# Patient Record
Sex: Female | Born: 1960
Health system: Southern US, Community
[De-identification: ages and names within clinical notes are randomized; demographics above are authoritative.]

## PROBLEM LIST (undated history)

## (undated) DIAGNOSIS — I10 Essential (primary) hypertension: Secondary | ICD-10-CM

## (undated) DIAGNOSIS — R590 Localized enlarged lymph nodes: Secondary | ICD-10-CM

## (undated) DIAGNOSIS — J309 Allergic rhinitis, unspecified: Secondary | ICD-10-CM

## (undated) DIAGNOSIS — K219 Gastro-esophageal reflux disease without esophagitis: Secondary | ICD-10-CM

## (undated) DIAGNOSIS — M25569 Pain in unspecified knee: Secondary | ICD-10-CM

## (undated) DIAGNOSIS — M1990 Unspecified osteoarthritis, unspecified site: Secondary | ICD-10-CM

## (undated) DIAGNOSIS — R748 Abnormal levels of other serum enzymes: Secondary | ICD-10-CM

## (undated) DIAGNOSIS — K439 Ventral hernia without obstruction or gangrene: Secondary | ICD-10-CM

## (undated) DIAGNOSIS — R591 Generalized enlarged lymph nodes: Secondary | ICD-10-CM

## (undated) DIAGNOSIS — R1900 Intra-abdominal and pelvic swelling, mass and lump, unspecified site: Secondary | ICD-10-CM

## (undated) DIAGNOSIS — K7460 Unspecified cirrhosis of liver: Secondary | ICD-10-CM

## (undated) HISTORY — PX: BREAST BIOPSY: SHX20

## (undated) HISTORY — PX: GALLBLADDER SURGERY: SHX652

## (undated) HISTORY — PX: CORONARY ANGIOPLASTY: SHX604

## (undated) HISTORY — DX: Essential (primary) hypertension: I10

## (undated) HISTORY — DX: Localized enlarged lymph nodes: R59.0

## (undated) HISTORY — DX: Allergic rhinitis, unspecified: J30.9

## (undated) HISTORY — PX: ABDOMINAL HYSTERECTOMY: SHX81

## (undated) HISTORY — DX: Ventral hernia without obstruction or gangrene: K43.9

## (undated) HISTORY — DX: Abnormal levels of other serum enzymes: R74.8

## (undated) HISTORY — DX: Pain in unspecified knee: M25.569

## (undated) HISTORY — DX: Generalized enlarged lymph nodes: R59.1

## (undated) HISTORY — PX: JOINT REPLACEMENT: SHX530

## (undated) HISTORY — PX: TUBAL LIGATION: SHX77

## (undated) HISTORY — DX: Intra-abdominal and pelvic swelling, mass and lump, unspecified site: R19.00

---

## 1995-05-09 HISTORY — PX: CHOLECYSTECTOMY: SHX55

## 2000-07-03 ENCOUNTER — Inpatient Hospital Stay (HOSPITAL_COMMUNITY): Admission: AD | Admit: 2000-07-03 | Discharge: 2000-07-03 | Payer: Self-pay | Admitting: *Deleted

## 2000-07-03 ENCOUNTER — Encounter: Payer: Self-pay | Admitting: Obstetrics & Gynecology

## 2000-07-09 ENCOUNTER — Inpatient Hospital Stay (HOSPITAL_COMMUNITY): Admission: AD | Admit: 2000-07-09 | Discharge: 2000-07-09 | Payer: Self-pay | Admitting: Obstetrics and Gynecology

## 2000-07-27 ENCOUNTER — Inpatient Hospital Stay (HOSPITAL_COMMUNITY): Admission: AD | Admit: 2000-07-27 | Discharge: 2000-07-28 | Payer: Self-pay | Admitting: Obstetrics and Gynecology

## 2000-07-31 ENCOUNTER — Other Ambulatory Visit: Admission: RE | Admit: 2000-07-31 | Discharge: 2000-07-31 | Payer: Self-pay | Admitting: Obstetrics and Gynecology

## 2000-08-06 ENCOUNTER — Inpatient Hospital Stay (HOSPITAL_COMMUNITY): Admission: AD | Admit: 2000-08-06 | Discharge: 2000-08-06 | Payer: Self-pay | Admitting: Obstetrics and Gynecology

## 2000-08-06 ENCOUNTER — Encounter: Payer: Self-pay | Admitting: Obstetrics and Gynecology

## 2000-10-18 ENCOUNTER — Inpatient Hospital Stay (HOSPITAL_COMMUNITY): Admission: AD | Admit: 2000-10-18 | Discharge: 2000-10-18 | Payer: Self-pay | Admitting: Obstetrics and Gynecology

## 2000-12-26 ENCOUNTER — Inpatient Hospital Stay (HOSPITAL_COMMUNITY): Admission: AD | Admit: 2000-12-26 | Discharge: 2000-12-26 | Payer: Self-pay | Admitting: Obstetrics and Gynecology

## 2001-01-01 ENCOUNTER — Inpatient Hospital Stay (HOSPITAL_COMMUNITY): Admission: AD | Admit: 2001-01-01 | Discharge: 2001-01-01 | Payer: Self-pay | Admitting: Obstetrics and Gynecology

## 2001-01-01 ENCOUNTER — Encounter: Payer: Self-pay | Admitting: Obstetrics and Gynecology

## 2001-01-26 ENCOUNTER — Inpatient Hospital Stay (HOSPITAL_COMMUNITY): Admission: AD | Admit: 2001-01-26 | Discharge: 2001-01-28 | Payer: Self-pay | Admitting: Obstetrics and Gynecology

## 2001-03-06 ENCOUNTER — Other Ambulatory Visit: Admission: RE | Admit: 2001-03-06 | Discharge: 2001-03-06 | Payer: Self-pay | Admitting: Obstetrics and Gynecology

## 2002-03-17 ENCOUNTER — Ambulatory Visit (HOSPITAL_COMMUNITY): Admission: RE | Admit: 2002-03-17 | Discharge: 2002-03-17 | Payer: Self-pay | Admitting: *Deleted

## 2002-05-09 ENCOUNTER — Ambulatory Visit (HOSPITAL_COMMUNITY): Admission: RE | Admit: 2002-05-09 | Discharge: 2002-05-09 | Payer: Self-pay | Admitting: *Deleted

## 2004-06-19 ENCOUNTER — Emergency Department: Payer: Self-pay | Admitting: General Practice

## 2005-04-15 ENCOUNTER — Emergency Department: Payer: Self-pay | Admitting: Emergency Medicine

## 2005-04-16 ENCOUNTER — Emergency Department: Payer: Self-pay | Admitting: Emergency Medicine

## 2006-01-30 ENCOUNTER — Ambulatory Visit: Payer: Self-pay | Admitting: Family Medicine

## 2006-02-12 ENCOUNTER — Ambulatory Visit: Payer: Self-pay | Admitting: Family Medicine

## 2006-04-27 ENCOUNTER — Inpatient Hospital Stay: Payer: Self-pay | Admitting: Obstetrics and Gynecology

## 2006-08-03 ENCOUNTER — Emergency Department: Payer: Self-pay | Admitting: Unknown Physician Specialty

## 2006-08-05 ENCOUNTER — Emergency Department: Payer: Self-pay | Admitting: Emergency Medicine

## 2006-08-06 ENCOUNTER — Other Ambulatory Visit: Payer: Self-pay

## 2006-08-06 ENCOUNTER — Emergency Department: Payer: Self-pay | Admitting: Internal Medicine

## 2006-08-28 ENCOUNTER — Ambulatory Visit: Payer: Self-pay | Admitting: Obstetrics and Gynecology

## 2007-03-14 ENCOUNTER — Observation Stay: Payer: Self-pay | Admitting: Internal Medicine

## 2007-10-07 ENCOUNTER — Observation Stay: Payer: Self-pay | Admitting: Internal Medicine

## 2007-10-07 ENCOUNTER — Other Ambulatory Visit: Payer: Self-pay

## 2007-10-21 ENCOUNTER — Ambulatory Visit: Payer: Self-pay | Admitting: Gastroenterology

## 2008-02-11 ENCOUNTER — Ambulatory Visit: Payer: Self-pay | Admitting: Family Medicine

## 2008-04-26 ENCOUNTER — Emergency Department: Payer: Self-pay | Admitting: Emergency Medicine

## 2009-03-08 ENCOUNTER — Ambulatory Visit: Payer: Self-pay | Admitting: Family Medicine

## 2009-03-21 ENCOUNTER — Emergency Department: Payer: Self-pay | Admitting: Emergency Medicine

## 2010-02-05 ENCOUNTER — Observation Stay: Payer: Self-pay | Admitting: Internal Medicine

## 2010-04-14 ENCOUNTER — Ambulatory Visit: Payer: Self-pay | Admitting: Family Medicine

## 2010-07-29 ENCOUNTER — Other Ambulatory Visit: Payer: Self-pay | Admitting: General Surgery

## 2010-07-29 ENCOUNTER — Ambulatory Visit
Admission: RE | Admit: 2010-07-29 | Discharge: 2010-07-29 | Disposition: A | Discharge status: Home / Self Care | Payer: 59 | Source: Ambulatory Visit | Attending: General Surgery | Admitting: General Surgery

## 2010-09-13 ENCOUNTER — Encounter (INDEPENDENT_AMBULATORY_CARE_PROVIDER_SITE_OTHER): Payer: Self-pay | Admitting: General Surgery

## 2011-03-27 ENCOUNTER — Ambulatory Visit: Payer: Self-pay | Admitting: Family Medicine

## 2011-04-13 ENCOUNTER — Ambulatory Visit: Payer: Self-pay | Admitting: Otolaryngology

## 2011-04-21 ENCOUNTER — Emergency Department: Payer: Self-pay | Admitting: Emergency Medicine

## 2011-07-21 ENCOUNTER — Ambulatory Visit: Payer: Self-pay | Admitting: Family Medicine

## 2011-10-27 ENCOUNTER — Emergency Department: Payer: Self-pay | Admitting: *Deleted

## 2011-10-31 ENCOUNTER — Ambulatory Visit: Payer: Self-pay | Admitting: Family Medicine

## 2011-11-08 ENCOUNTER — Ambulatory Visit: Payer: Self-pay | Admitting: Orthopedic Surgery

## 2011-11-29 ENCOUNTER — Emergency Department: Payer: Self-pay | Admitting: Emergency Medicine

## 2011-11-29 ENCOUNTER — Ambulatory Visit: Payer: Self-pay | Admitting: Orthopedic Surgery

## 2011-11-29 LAB — BASIC METABOLIC PANEL
Anion Gap: 9 (ref 7–16)
BUN: 16 mg/dL (ref 7–18)
Calcium, Total: 9.2 mg/dL (ref 8.5–10.1)
Chloride: 96 mmol/L — ABNORMAL LOW (ref 98–107)
Co2: 28 mmol/L (ref 21–32)
Creatinine: 0.8 mg/dL (ref 0.60–1.30)
EGFR (African American): >60
EGFR (Non-African Amer.): >60
Glucose: 226 mg/dL — ABNORMAL HIGH (ref 65–99)
Osmolality: 275 (ref 275–301)
Potassium: 3.5 mmol/L (ref 3.5–5.1)
Sodium: 133 mmol/L — ABNORMAL LOW (ref 136–145)

## 2011-11-29 LAB — URINALYSIS, COMPLETE
Bilirubin,UR: NEGATIVE
Blood: NEGATIVE
Glucose,UR: NEGATIVE mg/dL (ref 0–75)
Ketone: NEGATIVE
Leukocyte Esterase: NEGATIVE
Nitrite: NEGATIVE
Ph: 5 (ref 4.5–8.0)
Protein: 30
RBC,UR: 3 /HPF (ref 0–5)
Specific Gravity: 1.026 (ref 1.003–1.030)
Squamous Epithelial: 3
WBC UR: 2 /HPF (ref 0–5)

## 2011-11-29 LAB — CBC
HCT: 44.2 % (ref 35.0–47.0)
HGB: 13.6 g/dL (ref 12.0–16.0)
MCH: 25.2 pg — ABNORMAL LOW (ref 26.0–34.0)
MCHC: 30.7 g/dL — ABNORMAL LOW (ref 32.0–36.0)
MCV: 82 fL (ref 80–100)
Platelet: 189 10*3/uL (ref 150–440)
RBC: 5.4 10*6/uL — ABNORMAL HIGH (ref 3.80–5.20)
RDW: 14.5 % (ref 11.5–14.5)
WBC: 8.4 10*3/uL (ref 3.6–11.0)

## 2011-11-30 ENCOUNTER — Emergency Department: Payer: Self-pay | Admitting: Emergency Medicine

## 2011-11-30 LAB — CBC WITH DIFFERENTIAL/PLATELET
Basophil #: 0.1 10*3/uL (ref 0.0–0.1)
Basophil %: 0.9 %
Eosinophil #: 0.2 10*3/uL (ref 0.0–0.7)
Eosinophil %: 2.1 %
HCT: 42.1 % (ref 35.0–47.0)
HGB: 13.4 g/dL (ref 12.0–16.0)
Lymphocyte %: 39.9 %
Lymphs Abs: 3 10*3/uL (ref 1.0–3.6)
MCH: 26.2 pg (ref 26.0–34.0)
MCHC: 31.9 g/dL — ABNORMAL LOW (ref 32.0–36.0)
MCV: 82 fL (ref 80–100)
Monocyte #: 0.5 x10 3/mm (ref 0.2–0.9)
Monocyte %: 7.1 %
Neutrophil #: 3.7 10*3/uL (ref 1.4–6.5)
Neutrophil %: 50 %
Platelet: 181 10*3/uL (ref 150–440)
RBC: 5.11 10*6/uL (ref 3.80–5.20)
RDW: 14.4 % (ref 11.5–14.5)
WBC: 7.4 10*3/uL (ref 3.6–11.0)

## 2011-11-30 LAB — URINALYSIS, COMPLETE
Bacteria: NONE SEEN
Bilirubin,UR: NEGATIVE
Blood: NEGATIVE
Glucose,UR: >=500 mg/dL (ref 0–75)
Ketone: NEGATIVE
Leukocyte Esterase: NEGATIVE
Nitrite: NEGATIVE
Ph: 6 (ref 4.5–8.0)
Protein: NEGATIVE
RBC,UR: 1 /HPF (ref 0–5)
Specific Gravity: 1.028 (ref 1.003–1.030)
Squamous Epithelial: 1
WBC UR: 2 /HPF (ref 0–5)

## 2011-11-30 LAB — COMPREHENSIVE METABOLIC PANEL
Albumin: 4 g/dL (ref 3.4–5.0)
Alkaline Phosphatase: 259 U/L — ABNORMAL HIGH (ref 50–136)
Anion Gap: 12 (ref 7–16)
BUN: 19 mg/dL — ABNORMAL HIGH (ref 7–18)
Bilirubin,Total: 0.6 mg/dL (ref 0.2–1.0)
Calcium, Total: 9.1 mg/dL (ref 8.5–10.1)
Chloride: 94 mmol/L — ABNORMAL LOW (ref 98–107)
Co2: 27 mmol/L (ref 21–32)
Creatinine: 0.88 mg/dL (ref 0.60–1.30)
EGFR (African American): >60
EGFR (Non-African Amer.): >60
Glucose: 340 mg/dL — ABNORMAL HIGH (ref 65–99)
Osmolality: 282 (ref 275–301)
Potassium: 3.8 mmol/L (ref 3.5–5.1)
SGOT(AST): 66 U/L — ABNORMAL HIGH (ref 15–37)
SGPT (ALT): 83 U/L — ABNORMAL HIGH
Sodium: 133 mmol/L — ABNORMAL LOW (ref 136–145)
Total Protein: 9 g/dL — ABNORMAL HIGH (ref 6.4–8.2)

## 2011-12-07 HISTORY — PX: KNEE SURGERY: SHX244

## 2011-12-11 ENCOUNTER — Ambulatory Visit: Payer: Self-pay | Admitting: Orthopedic Surgery

## 2011-12-11 LAB — CBC
HCT: 36.5 % (ref 35.0–47.0)
HGB: 12 g/dL (ref 12.0–16.0)
MCH: 26.8 pg (ref 26.0–34.0)
MCHC: 32.9 g/dL (ref 32.0–36.0)
MCV: 81 fL (ref 80–100)
Platelet: 200 10*3/uL (ref 150–440)
RBC: 4.49 10*6/uL (ref 3.80–5.20)
RDW: 14.7 % — ABNORMAL HIGH (ref 11.5–14.5)
WBC: 7.9 10*3/uL (ref 3.6–11.0)

## 2011-12-11 LAB — MRSA PCR SCREENING

## 2011-12-11 LAB — BASIC METABOLIC PANEL
Anion Gap: 6 — ABNORMAL LOW (ref 7–16)
BUN: 10 mg/dL (ref 7–18)
Calcium, Total: 9 mg/dL (ref 8.5–10.1)
Chloride: 104 mmol/L (ref 98–107)
Co2: 29 mmol/L (ref 21–32)
Creatinine: 0.75 mg/dL (ref 0.60–1.30)
EGFR (African American): >60
EGFR (Non-African Amer.): >60
Glucose: 88 mg/dL (ref 65–99)
Osmolality: 276 (ref 275–301)
Potassium: 3.5 mmol/L (ref 3.5–5.1)
Sodium: 139 mmol/L (ref 136–145)

## 2011-12-11 LAB — SEDIMENTATION RATE: Erythrocyte Sed Rate: 55 mm/h — ABNORMAL HIGH (ref 0–30)

## 2011-12-21 ENCOUNTER — Inpatient Hospital Stay: Payer: Self-pay | Admitting: Orthopedic Surgery

## 2011-12-22 LAB — BASIC METABOLIC PANEL
Anion Gap: 7 (ref 7–16)
BUN: 9 mg/dL (ref 7–18)
Calcium, Total: 8.2 mg/dL — ABNORMAL LOW (ref 8.5–10.1)
Chloride: 101 mmol/L (ref 98–107)
Co2: 28 mmol/L (ref 21–32)
Creatinine: 0.7 mg/dL (ref 0.60–1.30)
EGFR (African American): >60
EGFR (Non-African Amer.): >60
Glucose: 184 mg/dL — ABNORMAL HIGH (ref 65–99)
Osmolality: 275 (ref 275–301)
Potassium: 3.7 mmol/L (ref 3.5–5.1)
Sodium: 136 mmol/L (ref 136–145)

## 2011-12-22 LAB — HEMOGLOBIN: HGB: 10.4 g/dL — ABNORMAL LOW (ref 12.0–16.0)

## 2011-12-22 LAB — PLATELET COUNT: Platelet: 161 10*3/uL (ref 150–440)

## 2011-12-23 LAB — HEMOGLOBIN: HGB: 9.4 g/dL — ABNORMAL LOW (ref 12.0–16.0)

## 2011-12-25 LAB — PATHOLOGY REPORT

## 2012-05-13 ENCOUNTER — Ambulatory Visit: Payer: Self-pay | Admitting: Otolaryngology

## 2012-11-06 DIAGNOSIS — N9489 Other specified conditions associated with female genital organs and menstrual cycle: Secondary | ICD-10-CM | POA: Insufficient documentation

## 2012-11-12 LAB — HM PAP SMEAR: HM Pap smear: NEGATIVE

## 2012-11-19 DIAGNOSIS — K439 Ventral hernia without obstruction or gangrene: Secondary | ICD-10-CM | POA: Insufficient documentation

## 2012-11-19 DIAGNOSIS — R1900 Intra-abdominal and pelvic swelling, mass and lump, unspecified site: Secondary | ICD-10-CM

## 2012-11-19 HISTORY — PX: HERNIA REPAIR: SHX51

## 2012-11-19 HISTORY — DX: Ventral hernia without obstruction or gangrene: K43.9

## 2012-11-19 HISTORY — DX: Intra-abdominal and pelvic swelling, mass and lump, unspecified site: R19.00

## 2013-07-28 DIAGNOSIS — M25551 Pain in right hip: Secondary | ICD-10-CM | POA: Insufficient documentation

## 2013-07-28 DIAGNOSIS — Z96659 Presence of unspecified artificial knee joint: Secondary | ICD-10-CM | POA: Insufficient documentation

## 2013-08-18 ENCOUNTER — Ambulatory Visit: Payer: Self-pay | Admitting: Otolaryngology

## 2013-08-26 ENCOUNTER — Ambulatory Visit: Payer: Self-pay | Admitting: Family Medicine

## 2013-08-26 LAB — HM MAMMOGRAPHY

## 2013-09-15 ENCOUNTER — Ambulatory Visit: Payer: Self-pay | Admitting: Otolaryngology

## 2013-10-01 ENCOUNTER — Ambulatory Visit: Payer: Self-pay | Admitting: Otolaryngology

## 2013-10-02 LAB — PATHOLOGY REPORT

## 2014-01-09 ENCOUNTER — Other Ambulatory Visit (HOSPITAL_COMMUNITY): Payer: Self-pay | Admitting: Orthopedic Surgery

## 2014-01-09 DIAGNOSIS — M25562 Pain in left knee: Secondary | ICD-10-CM

## 2014-01-19 ENCOUNTER — Encounter (HOSPITAL_COMMUNITY): Payer: 59

## 2014-01-19 ENCOUNTER — Encounter (HOSPITAL_COMMUNITY): Payer: Self-pay

## 2014-01-19 ENCOUNTER — Ambulatory Visit (HOSPITAL_COMMUNITY): Payer: Self-pay

## 2014-01-19 ENCOUNTER — Encounter (HOSPITAL_COMMUNITY)
Admission: RE | Admit: 2014-01-19 | Discharge: 2014-01-19 | Disposition: A | Discharge status: Home / Self Care | Payer: 59 | Source: Ambulatory Visit | Attending: Orthopedic Surgery | Admitting: Orthopedic Surgery

## 2014-01-19 DIAGNOSIS — M25562 Pain in left knee: Secondary | ICD-10-CM

## 2014-01-19 DIAGNOSIS — M25569 Pain in unspecified knee: Secondary | ICD-10-CM | POA: Insufficient documentation

## 2014-01-19 MED ORDER — TECHNETIUM TC 99M MEDRONATE IV KIT
25.0000 | PACK | Freq: Once | INTRAVENOUS | Status: AC | PRN
  Administered 2014-01-19: 25 via INTRAVENOUS

## 2014-03-17 DIAGNOSIS — E1169 Type 2 diabetes mellitus with other specified complication: Secondary | ICD-10-CM | POA: Insufficient documentation

## 2014-03-17 DIAGNOSIS — E785 Hyperlipidemia, unspecified: Secondary | ICD-10-CM

## 2014-03-23 ENCOUNTER — Ambulatory Visit: Payer: Self-pay

## 2014-08-25 NOTE — Op Note (Signed)
PATIENT NAME:  Lisa [name], Lisa [name] MR#:  [id number] DATE OF BIRTH:  1961/01/30  DATE OF PROCEDURE:  12/21/2011  PREOPERATIVE DIAGNOSIS: Left knee osteoarthritis.   POSTOPERATIVE DIAGNOSIS: Left knee osteoarthritis.   PROCEDURE: Left total knee replacement.   SURGEON: Laurene Footman.   ANESTHESIA: Spinal.   DESCRIPTION OF PROCEDURE: The patient was brought to the Operating Room and after adequate anesthesia was obtained the left leg was prepped and draped in the usual sterile fashion after tourniquet was applied to the upper thigh and Alvarado legholder being used. After appropriate patient identification and time out procedures were completed, the leg was exsanguinated with an Esmarch and the tourniquet raised to 300 mmHg. A midline skin incision was made followed by a medial parapatellar arthrotomy. Inspection revealed significant wear and synovitis particularly in the medial and patellofemoral. The anterior cruciate ligament was excised along with patellar fat pad and the tibia exposed for application of the [place] cutting guide to the proximal tibia. When alignment was checked and pins placed, the proximal tibia cut was carried out and bone removed. The femur was exposed in a similar manner and cutting block applied, checked and distal femoral cut made. The #4 cutting guide block was applied. Anterior, posterior, and chamfer cuts were carried out with no notching. The residual bone on the tibia was removed at this point along with the medial and lateral menisci. The #4 tibia was set with the punch and 10 mm trial and the #4 femur. There was excellent stability in flexion and extension. Distal femoral drill hole and femoral notch were created at this time. The patella was cut and sized to a size 2 with the cutting guide, drill holes placed and tracked well with no touch technique. At this point, all trials were removed and cocktail of morphine, Toradol, and 0.25% Sensorcaine were infiltrated. The bony  surfaces were thoroughly irrigated and dried. The components were cemented into place with excess cement removed. The tourniquet was let down and hemostasis checked with electrocautery. The arthrotomy was then repaired using a heavy quill suture, 2-0 quill subcutaneously and skin staples. Xeroform, 4 x 4's, ABD, Webril, Polar Care, and Ace wrap were applied. The patient was sent to the recovery room in stable condition.   ESTIMATED BLOOD LOSS: 100 mL.   COMPLICATIONS: None.   SPECIMEN: Cut ends of bone.   IMPLANTS: Medacta GMK primary total knee system size 4n left femur, size 4 tibia with a 10 mm standard tibial insert, and a 2 patellar component. All components were cemented.   TOURNIQUET TIME: 65 minutes at 300 mmHg. ____________________________ Laurene Footman, MD mjm:slb D: 12/21/2011 10:42:21 ET T: 12/21/2011 13:27:23 ET JOB#: [id number]  cc: Laurene Footman, MD, <Dictator> Laurene Footman MD ELECTRONICALLY SIGNED 12/21/2011 16:31

## 2014-08-25 NOTE — Discharge Summary (Signed)
PATIENT NAME:  Lisa [name], Lisa [name] MR#:  [id number] DATE OF BIRTH:  04-Mar-1961  DATE OF ADMISSION:  12/21/2011 DATE OF DISCHARGE:  12/25/2011  ADMITTING DIAGNOSIS: Left knee osteoarthritis.   DISCHARGE DIAGNOSIS: Left knee osteoarthritis.   PROCEDURE: Left total knee replacement.   SURGEON: Laurene Footman, MD    ANESTHESIA: Spinal.   ESTIMATED BLOOD LOSS: 100 mL.   COMPLICATIONS: None.   SPECIMEN: Cut ends of bone.   IMPLANTS: Medacta GMK primary total knee system, size 4N left femur, size 4 tibia with a 10 mm standard tibial insert, and a 2 patellar component. All components were cemented.  TOURNIQUET TIME: 65 minutes at 300 mmHg.   HISTORY: The patient is a 54 year old female who had a greater than one year history of severe left knee pain. The patient has been unable to have cortisone injections due to her diabetes but she had Synvisc injections without any relief. The patient continued to have persistent pain and swelling of the left knee, mostly on the medial joint line. X-rays as well as MRIs showed extensive arthritis in the left knee. The patient does not get much relief with oral medications as well. She works a lot on her feet and feels like she is unable to perform her job as well as other activities of daily living at home.   PHYSICAL EXAMINATION: GENERAL: Well developed, obese female in no apparent distress. Normal affect. Mild antalgic component on left lower extremity. HEENT: Head normocephalic, atraumatic. EYES: Pupils equal, round, and reactive to light. No dentures. LUNGS: Clear to auscultation. No wheezes, rales, or rhonchi. Good breath sounds bilaterally. HEART: Regular rate and rhythm. Normal S1, S2. LEFT KNEE: Examination of the left knee shows no effusion, no erythema, no warmth. No bony abnormalities noted. She does have passively correctable varus deformity. The patient is tender along the medial joint line, nontender along the lateral joint line. There is no noted  posterior joint effusion. The patient has full extension and full flexion. There is no tenderness of flexion, extension. The patient does have crepitus at the medial and patellofemoral joint of the left knee distally. She has a negative McMurray's test. There is mild retropatellar discomfort. She has a positive patella stress test. There is no instability of the knee. She has a negative valgus and varus stress test. NEUROLOGIC: There is good quad control. There is no noted atrophy. The patient has a negative straight leg raise. The patient has normal sensation to light touch. VASCULAR: The patient has less than 2 second capillary refill. Dorsalis pedis and posterior tibial pulses are intact.   HOSPITAL COURSE: The patient was admitted to the hospital on 12/21/2011. She had surgery that same day and was brought to the orthopedic floor from the PAC-U in stable condition. The patient's vital signs and labs were monitored and remained stable throughout her stay. The patient on 12/22/2011 did develop some nausea, vomiting, and diarrhea. Zofran was added and some medication adjustments were changed and the patient did improve. By 12/25/2011 the patient was stable and ready for discharge to home with home health.   DISCHARGE INSTRUCTIONS:  1. The patient may gradually increase weightbearing on the affected extremity.  2. She needs to elevate the affected foot and leg on 1 or 2 pillows with the foot higher than the knee.  3. Thigh-high TED hose on both legs and remove at bedtime, replace on arising the next morning.    DIET: She may resume a regular diet.   MEDICATIONS:  1. Lovenox 40 mg sub-Q once a day.   2. Tylenol 650 to 1000 mg every six hours as needed for pain. 3. Ultram 1 to 2 tablets every six hours as needed for pain. 4. Oxycodone 5 to 10 mg every four hours as needed for pain.   WOUND CARE:  1. Apply an ice pack to the affected area.  2. Continue using Polar Care unit maintaining temperature  between 40 to 50 degrees. 3. Do not get the dressing or bandage wet or dirty.  4. Call [facility] if the dressing gets water under it.  5. Leave the dressing on.   SYMPTOMS TO REPORT:  1. Call [facility] if any of the following occur: Bright red bleeding from the incision wound, fever above 101.5 degrees, redness, swelling or drainage at the incision.  2. Call [facility] if you experience any increased leg pain, numbness, or weakness in legs or bowel or bladder symptoms.   REFERRAL: She is referred to home with home physical therapy and home health. She is to call [facility] in 48 hours if she has not heard from them.   FOLLOW-UP: Follow-up as scheduled in 1 to 2 weeks with [facility].   DISCHARGE MEDICATIONS:  1. Metformin 1000 mg oral tablet 1 tablet orally once a day in the morning.  2. Metformin 1000 mg oral tablet 1.5 tabs orally once a day in the evening.  3. Omeprazole 40 mg oral tablet one cap orally once a day in the morning. 4. Glimepiride 2 mg oral tablet 1 tablet orally once a day in the morning.  5. Lantus 100 units/mL sub-Q solution 40 units subcutaneous once a day in the morning.  6. Lantus 100 units/mL sub-Q solution 20 units subcutaneous once a day at bedtime. 7. Lisinopril 5 mg oral tablet 1 tablet orally once a day in the morning.  8. Multivitamin 1 tab orally once a day in the morning.   ____________________________ Duanne Guess, PA-C tcg:drc D: 12/27/2011 17:14:08 ET T: 12/28/2011 11:29:14 ET JOB#: [id number]  cc: Duanne Guess, PA-C, <Dictator> Duanne Guess [place] ELECTRONICALLY SIGNED 01/04/2012 6:44

## 2014-08-29 NOTE — Op Note (Signed)
PATIENT NAME:  Lisa [name], Lisa [name] MR#:  [id number] DATE OF BIRTH:  Jan 26, 1961  DATE OF PROCEDURE:  10/01/2013  PREOPERATIVE DIAGNOSIS: Left thyroid nodule.   POSTOPERATIVE DIAGNOSIS: Left thyroid nodule.   PROCEDURE: Left hemi-thyroidectomy.   SURGEON: Malon Kindle, MD.   ANESTHESIA: General endotracheal.   INDICATIONS: Patient with a history of left thyroid nodule, which is enlarged, currently measuring as large as 7 cm in greatest dimension.   COMPLICATIONS: None.   FIRST ASSISTANT: Margaretha Sheffield, MD   DESCRIPTION OF PROCEDURE: After obtaining informed consent, the patient was taken to operating room and placed in the supine position. General endotracheal anesthesia was induced. Her endotracheal tube was wrapped with an electrode array to help monitor the laryngeal nerve during surgery. This was placed under direct visualization and I observed placement of the electrodes between the vocal cords in the appropriate position. Lidocaine 1% with epinephrine 1:100,000 was injected in the skin, utilizing a crease just over the thyroid gland. The patient was then prepped and draped in the usual sterile fashion. A 15 blade was used to incise the skin. The incision carried down to the strap muscles with the Bovie. The strap muscles were identified and divided in the midline with the Bovie and retracted laterally. Blunt dissection then was performed dissecting over the thyroid gland, which was quite large. The Harmonic scalpel was used to divide attachments as they were encountered. Middle thyroid vessels were divided with the Harmonic scalpel when encountered. The inferior pole freed up nicely with blunt finger dissection. A small parathyroid was identified inferiorly, carefully dissected away from the gland and left in place with its vascular attachments, dissecting the gland away from this.   Dissection then proceeded superiorly where the superior pole vessels were identified and divided with the  Harmonic scalpel. A cluster of fatty tissue with possible parathyroid tissue was identified in conjunction with the superior pole vasculature and this was carefully dissected away from the capsule of the gland using the bipolar cautery and preserved with its vascular pedicle. An attempt was then made to deliver the gland from the neck as these areas were all fairly freed up. However, the gland was very friable over the nodule, which ruptured. There was some hemorrhage found within the ruptured nodule. This was all evacuated and we proceeded to continue dissecting around the gland freeing it up further until the gland could be delivered through the incision and retracted medially to identify the recurrent laryngeal nerve. Careful dissection in the region of the tracheoesophageal groove identified the recurrent laryngeal nerve, which was confirmed with stimulation. The gland was then dissected off of the trachea, dividing attachments with the Harmonic scalpel including Berry's ligament with the recurrent laryngeal nerve in full view and protected. The gland was dissected off the trachea and the isthmus divided with the Harmonic scalpel and the gland delivered and sent as a specimen along with fragments that had come from within the gland when it ruptured.   The neck wound was vigorously irrigated to make sure any fragments of gland were evacuated from the neck. Hemostasis was evaluated and there was a small area at Berry's ligament that had some persistent oozing despite careful bipolar cautery, avoiding injury to the nerve. Oozing was minimal, so Surgicel was placed there to control minor oozing as well as in an area inferiorly where there was some minor oozing that did not respond to bipolar cautery. No heavy residual bleeding was seen, however. The nerve was re-stimulated to make sure it  was still intact and it was. A #10 TLS drain was then placed through the skin and secured with a 3-0 Prolene suture. The strap  muscles were closed in interrupted fashion with 4-0 Vicryl. The platysma and subcutaneous tissues were closed with 4-0 Vicryl and the skin was closed with a 3-0 Prolene in a running subcuticular stitch. The drain was then hooked up to suction. The patient was returned to the anesthesiologist for awakening. She was awakened and taken to the recovery room in good condition postoperatively. Blood loss was approximately 75 mL.   ____________________________ Sammuel Hines. Richardson Landry, MD psb:aw D: 10/01/2013 09:53:21 ET T: 10/01/2013 10:08:11 ET JOB#: [id number]  cc: Sammuel Hines. Richardson Landry, MD, <Dictator> Riley Nearing MD ELECTRONICALLY SIGNED 10/03/2013 17:36

## 2014-10-30 ENCOUNTER — Other Ambulatory Visit: Payer: Self-pay | Admitting: *Deleted

## 2014-10-30 MED ORDER — OMEPRAZOLE 40 MG PO CPDR: 40.0000 mg | DELAYED_RELEASE_CAPSULE | Freq: Every day | ORAL | Status: DC

## 2014-10-30 NOTE — Telephone Encounter (Signed)
Refill request for Omeprazole 40 mg Last filled by MD on- 06/04/2013 #90 x3 Last Appt: 08/23/2013 Next Appt: none Please advise refill?

## 2014-11-03 ENCOUNTER — Other Ambulatory Visit: Payer: Self-pay | Admitting: Family Medicine

## 2014-11-03 DIAGNOSIS — K219 Gastro-esophageal reflux disease without esophagitis: Secondary | ICD-10-CM | POA: Insufficient documentation

## 2014-11-03 MED ORDER — OMEPRAZOLE 40 MG PO CPDR: 40.0000 mg | DELAYED_RELEASE_CAPSULE | Freq: Every day | ORAL | Status: DC

## 2014-11-16 ENCOUNTER — Other Ambulatory Visit: Payer: Self-pay | Admitting: Family Medicine

## 2014-11-16 DIAGNOSIS — Z1231 Encounter for screening mammogram for malignant neoplasm of breast: Secondary | ICD-10-CM

## 2014-11-18 ENCOUNTER — Ambulatory Visit
Admission: RE | Admit: 2014-11-18 | Discharge: 2014-11-18 | Disposition: A | Discharge status: Home / Self Care | Payer: 59 | Source: Ambulatory Visit | Attending: Family Medicine | Admitting: Family Medicine

## 2014-11-18 DIAGNOSIS — Z1231 Encounter for screening mammogram for malignant neoplasm of breast: Secondary | ICD-10-CM | POA: Insufficient documentation

## 2014-11-25 ENCOUNTER — Telehealth: Payer: Self-pay | Admitting: Family Medicine

## 2014-11-25 NOTE — Telephone Encounter (Signed)
Mammogram normal. Please notify patient. She should have received at letter. We usually do not call anymore.  Thanks.

## 2014-11-25 NOTE — Telephone Encounter (Signed)
Pt. Called wanting to know if her results are back from her mammogram from last week. CB# [phone]. CC

## 2014-11-25 NOTE — Telephone Encounter (Signed)
Please review. Thanks!  

## 2014-11-26 NOTE — Telephone Encounter (Signed)
Tried calling; no answer.   Thanks,   -Laura  

## 2014-11-27 NOTE — Telephone Encounter (Signed)
Tried calling several times; but number was ringing busy.

## 2014-12-01 NOTE — Telephone Encounter (Signed)
Tried calling; no answer 12/01/2014  Thanks,   -Mickel Baas

## 2015-02-15 DIAGNOSIS — M25569 Pain in unspecified knee: Secondary | ICD-10-CM | POA: Insufficient documentation

## 2015-02-15 DIAGNOSIS — M545 Low back pain, unspecified: Secondary | ICD-10-CM | POA: Insufficient documentation

## 2015-02-15 DIAGNOSIS — W540XXA Bitten by dog, initial encounter: Secondary | ICD-10-CM | POA: Insufficient documentation

## 2015-02-15 DIAGNOSIS — K439 Ventral hernia without obstruction or gangrene: Secondary | ICD-10-CM | POA: Insufficient documentation

## 2015-02-15 DIAGNOSIS — B356 Tinea cruris: Secondary | ICD-10-CM | POA: Insufficient documentation

## 2015-02-15 DIAGNOSIS — J309 Allergic rhinitis, unspecified: Secondary | ICD-10-CM

## 2015-02-15 DIAGNOSIS — B373 Candidiasis of vulva and vagina: Secondary | ICD-10-CM | POA: Insufficient documentation

## 2015-02-15 DIAGNOSIS — I10 Essential (primary) hypertension: Secondary | ICD-10-CM | POA: Insufficient documentation

## 2015-02-15 DIAGNOSIS — E010 Iodine-deficiency related diffuse (endemic) goiter: Secondary | ICD-10-CM | POA: Insufficient documentation

## 2015-02-15 DIAGNOSIS — M79673 Pain in unspecified foot: Secondary | ICD-10-CM | POA: Insufficient documentation

## 2015-02-15 DIAGNOSIS — B3731 Acute candidiasis of vulva and vagina: Secondary | ICD-10-CM | POA: Insufficient documentation

## 2015-02-15 DIAGNOSIS — E7800 Pure hypercholesterolemia, unspecified: Secondary | ICD-10-CM | POA: Insufficient documentation

## 2015-02-15 DIAGNOSIS — E119 Type 2 diabetes mellitus without complications: Secondary | ICD-10-CM | POA: Insufficient documentation

## 2015-02-15 DIAGNOSIS — F4320 Adjustment disorder, unspecified: Secondary | ICD-10-CM | POA: Insufficient documentation

## 2015-02-15 HISTORY — DX: Essential (primary) hypertension: I10

## 2015-02-15 HISTORY — DX: Allergic rhinitis, unspecified: J30.9

## 2015-02-15 HISTORY — DX: Pain in unspecified knee: M25.569

## 2015-02-16 ENCOUNTER — Ambulatory Visit (INDEPENDENT_AMBULATORY_CARE_PROVIDER_SITE_OTHER): Payer: 59 | Admitting: Family Medicine

## 2015-02-16 ENCOUNTER — Encounter: Payer: Self-pay | Admitting: Family Medicine

## 2015-02-16 VITALS — BP 120/80 | HR 84 | Temp 98.8°F | Resp 16 | Ht 69.0 in | Wt 222.0 lb

## 2015-02-16 DIAGNOSIS — M5441 Lumbago with sciatica, right side: Secondary | ICD-10-CM | POA: Diagnosis not present

## 2015-02-16 DIAGNOSIS — E7800 Pure hypercholesterolemia, unspecified: Secondary | ICD-10-CM

## 2015-02-16 DIAGNOSIS — M5431 Sciatica, right side: Secondary | ICD-10-CM | POA: Diagnosis not present

## 2015-02-16 DIAGNOSIS — E119 Type 2 diabetes mellitus without complications: Secondary | ICD-10-CM | POA: Insufficient documentation

## 2015-02-16 MED ORDER — ATORVASTATIN CALCIUM 20 MG PO TABS: 20.0000 mg | ORAL_TABLET | Freq: Every day | ORAL | Status: DC

## 2015-02-16 MED ORDER — CYCLOBENZAPRINE HCL 5 MG PO TABS: 5.0000 mg | ORAL_TABLET | Freq: Three times a day (TID) | ORAL | Status: DC | PRN

## 2015-02-16 MED ORDER — MELOXICAM 15 MG PO TABS: 15.0000 mg | ORAL_TABLET | Freq: Every day | ORAL | Status: DC

## 2015-02-16 NOTE — Progress Notes (Signed)
Patient ID: Lisa [name], female   DOB: [birth year], 54 y.o.   MRN: [id number]        Patient: Lisa [name] Female    DOB: 1960-08-03   54 y.o.   MRN: [id number] Visit Date: 02/16/2015  Today's Provider: Margarita Rana, MD   Chief Complaint  Patient presents with  . Hip Pain   Subjective:    HPI  Hip Pain: Patient complains of right hip pain. Onset of the symptoms was a week ago. Inciting event: none. Current symptoms include is worse with weight bearing and is aggravated by walking. Aggravating symptoms: any movement. Patient's overall course: gradually worsening. Patient has had prior hip problems. Has seen orthopedics in the past. Had knee surgery.  Previous visits for this problem: none. Evaluation to date: none.  Treatment to date: Did try Tylenol last night.  Did help. Really does not want a medication.  Has had trouble with her hip in the past.   Was unsure if related to her diabetes medication.  Having a lot of dizziness and headaches.  Can not keep anything down. Have side effects of Victoza.  Has follow up on Monday.  Also has high cholesterol.  Needs to be on medication for that.  Cholesterol was  306, HDL 76 and LDL is 196.      Allergies  Allergen Reactions  . Amoxicillin-Pot Clavulanate   . Ciprofloxacin   . Clarithromycin    Previous Medications   ASPIRIN 81 MG TABLET    Take 1 tablet by mouth daily.   BLOOD GLUCOSE MONITORING SUPPL (FIFTY50 GLUCOSE METER 2.0) W/DEVICE KIT    Use as directed.   GLYBURIDE (DIABETA) 2.5 MG TABLET    Take 1 tablet by mouth daily.   INSULIN GLARGINE (LANTUS) 100 UNIT/ML INJECTION    Inject 50 Units into the skin daily.   INSULIN PEN NEEDLE (B-D UF III MINI PEN NEEDLES) 31G X 5 MM MISC    Use 1 needle daily with victoza injection   LIRAGLUTIDE (VICTOZA) 18 MG/3ML SOPN    Take as prescribed. Start with 0.6 mg daily to be increased to 1.8 mg daily within 2 weeks.   LISINOPRIL (PRINIVIL,ZESTRIL) 20 MG TABLET    Take 1  tablet by mouth daily.   METFORMIN (GLUMETZA) 1000 MG (MOD) 24 HR TABLET    Take 1,000 mg by mouth daily with breakfast.     MULTIPLE VITAMINS-MINERALS PO    Take 1 tablet by mouth daily.   OMEPRAZOLE (PRILOSEC) 40 MG CAPSULE    Take 1 capsule (40 mg total) by mouth daily.    Review of Systems  Constitutional: Negative.   Respiratory: Negative.   Musculoskeletal: Positive for joint swelling and gait problem.    Social History  Substance Use Topics  . Smoking status: Never Smoker   . Smokeless tobacco: Not on file  . Alcohol Use: No   Objective:   BP 120/80 mmHg  Pulse 84  Temp(Src) 98.8 F (37.1 C) (Oral)  Resp 16  Ht 5' 9"  (1.753 m)  Wt 222 lb (100.699 kg)  BMI 32.77 kg/m2  SpO2 98%  Physical Exam  Constitutional: She is oriented to person, place, and time. She appears well-developed and well-nourished.  Musculoskeletal: She exhibits tenderness.  Tender right buttocks.  Antalgic gait.  Straight leg raises negative. Strength intact.    Neurological: She is alert and oriented to person, place, and time.  Psychiatric: She has a normal mood and affect. Her behavior is normal. Thought  content normal.      Assessment & Plan:     1. Sciatica of right side New problem, but has had a lot of joint issues in the past. Will treat with medication and refer to orthopedics.   - meloxicam (MOBIC) 15 MG tablet; Take 1 tablet (15 mg total) by mouth daily.  Dispense: 30 tablet; Refill: 3 - cyclobenzaprine (FLEXERIL) 5 MG tablet; Take 1 tablet (5 mg total) by mouth 3 (three) times daily as needed for muscle spasms.  Dispense: 30 tablet; Refill: 1 - AMB referral to orthopedics  2. Hypercholesteremia Very high. New problem. Will start medication and recheck labs in 6 weeks. Will stop and call back if any side effects.   - atorvastatin (LIPITOR) 20 MG tablet; Take 1 tablet (20 mg total) by mouth daily.  Dispense: 90 tablet; Refill: [place], MD  [facility]

## 2015-02-19 ENCOUNTER — Telehealth: Payer: Self-pay | Admitting: Family Medicine

## 2015-02-19 MED ORDER — TERCONAZOLE 0.4 % VA CREA: 1.0000 | TOPICAL_CREAM | Freq: Every day | VAGINAL | Status: DC

## 2015-02-19 NOTE — Telephone Encounter (Signed)
Pt states she is having burning and itching in her vaginal area.  Pt is requesting a Rx for Terconazole 0.4 cream.  [facility].  TV#[phone]/[phone]

## 2015-03-03 ENCOUNTER — Encounter: Payer: Self-pay | Admitting: Family Medicine

## 2015-03-03 ENCOUNTER — Ambulatory Visit (INDEPENDENT_AMBULATORY_CARE_PROVIDER_SITE_OTHER): Payer: 59 | Admitting: Family Medicine

## 2015-03-03 VITALS — BP 126/82 | HR 84 | Temp 99.2°F | Resp 16 | Ht 69.0 in | Wt 222.0 lb

## 2015-03-03 DIAGNOSIS — E7800 Pure hypercholesterolemia, unspecified: Secondary | ICD-10-CM

## 2015-03-03 DIAGNOSIS — E669 Obesity, unspecified: Secondary | ICD-10-CM

## 2015-03-03 DIAGNOSIS — E119 Type 2 diabetes mellitus without complications: Secondary | ICD-10-CM

## 2015-03-03 NOTE — Progress Notes (Signed)
Patient ID: Lisa [name], female   DOB: [birth year], 54 y.o.   MRN: [id number]         Patient: Lisa [name] Female    DOB: 05/25/1960   54 y.o.   MRN: [id number] Visit Date: 03/03/2015  Today's Provider: Margarita Rana, MD   Chief Complaint  Patient presents with  . Obesity   Subjective:    HPI   Obesity: Patient complains of obesity. Patient cites health, and insurance as reasons for wanting to lose weight.  Obesity History Weight in late teens: 150's lb. Period of greatest weight gain:  during early adult years Lowest adult weight: 166 in college Highest adult weight: 256 Amount of time at present weight: 222 lb. Has lost over 30 pounds.     History of Weight Loss Efforts Greatest amount of weight lost: 30 lb over 6-7 months Amount of time that loss was maintained: 6-7 months Successful weight loss techniques attempted: self-directed dieting and low carbs. Unsuccessful weight loss techniques attempted: None [facility] Current Exercise Habits walking  Planning to add some weight training.   Current Eating Habits Number of regular meals per day: 2 Number of snacking episodes per day: a few Who shops for food? patient Who prepares food? patient and partner Who eats with patient? patient and spouse Binge behavior?: no Purge behavior? no Anorexic behavior? no Eating precipitated by stress? yes   Other Potential Contributing Factors Use of alcohol: average 0 drinks/week Use of medications that may cause weight gain none History of past abuse? none Psych History: None Comorbidities: diabetes mellitus and dyslipidemias  Monitoring diabetes and started cholesterol medication.        Allergies  Allergen Reactions  . Amoxicillin-Pot Clavulanate   . Ciprofloxacin   . Clarithromycin    Previous Medications   ASPIRIN 81 MG TABLET    Take 1 tablet by mouth daily.   ATORVASTATIN (LIPITOR) 20 MG TABLET    Take  1 tablet (20 mg total) by mouth daily.   BLOOD GLUCOSE MONITORING SUPPL (FIFTY50 GLUCOSE METER 2.0) W/DEVICE KIT    Use as directed.   CYCLOBENZAPRINE (FLEXERIL) 5 MG TABLET    Take 1 tablet (5 mg total) by mouth 3 (three) times daily as needed for muscle spasms.   GLYBURIDE (DIABETA) 2.5 MG TABLET    Take 1 tablet by mouth daily.   INSULIN GLARGINE (LANTUS) 100 UNIT/ML INJECTION    Inject 50 Units into the skin daily.   INSULIN PEN NEEDLE (B-D UF III MINI PEN NEEDLES) 31G X 5 MM MISC    Use 1 needle daily with victoza injection   LIRAGLUTIDE (VICTOZA) 18 MG/3ML SOPN    Take as prescribed. Start with 0.6 mg daily to be increased to 1.8 mg daily within 2 weeks.   LISINOPRIL (PRINIVIL,ZESTRIL) 20 MG TABLET    Take 1 tablet by mouth daily.   MELOXICAM (MOBIC) 15 MG TABLET    Take 1 tablet (15 mg total) by mouth daily.   METFORMIN (GLUMETZA) 1000 MG (MOD) 24 HR TABLET    Take 1,000 mg by mouth daily with breakfast.     MULTIPLE VITAMINS-MINERALS PO    Take 1 tablet by mouth daily.   OMEPRAZOLE (PRILOSEC) 40 MG CAPSULE    Take 1 capsule (40 mg total) by mouth daily.    Review of Systems  Constitutional: Negative.   Respiratory: Negative.   Cardiovascular: Negative.   Gastrointestinal: Negative.   Endocrine: Positive for  heat intolerance. Negative for cold intolerance, polydipsia, polyphagia and polyuria.  Musculoskeletal: Positive for arthralgias (Pt will has an appoinment with orthopedic next week. ). Negative for myalgias, back pain, joint swelling, gait problem, neck pain and neck stiffness.  Neurological: Negative for dizziness, weakness, light-headedness, numbness and headaches.    Social History  Substance Use Topics  . Smoking status: Never Smoker   . Smokeless tobacco: Not on file  . Alcohol Use: No   Objective:   BP 126/82 mmHg  Pulse 84  Temp(Src) 99.2 F (37.3 C) (Oral)  Resp 16  Ht 5' 9"  (1.753 m)  Wt 222 lb (100.699 kg)  BMI 32.77 kg/m2  Physical Exam  Constitutional:  She is oriented to person, place, and time. She appears well-developed and well-nourished.  Cardiovascular: Normal rate and regular rhythm.   Pulmonary/Chest: Effort normal and breath sounds normal.  Neurological: She is alert and oriented to person, place, and time.  Psychiatric: She has a normal mood and affect. Her behavior is normal. Judgment and thought content normal.      Assessment & Plan:     1. Obesity Filled out form for work. Will work on lifestyle.    2. Type 2 diabetes mellitus without complication, without long-term current use of insulin (HCC) Stable.   Follow up with Endocrinology.    3. Hypercholesteremia Stable.  Continue current medication and plan of care.        Margarita Rana, MD  [facility]

## 2015-04-12 ENCOUNTER — Ambulatory Visit: Payer: 59 | Admitting: Family Medicine

## 2015-06-02 ENCOUNTER — Encounter: Payer: Self-pay | Admitting: Physician Assistant

## 2015-06-02 ENCOUNTER — Ambulatory Visit (INDEPENDENT_AMBULATORY_CARE_PROVIDER_SITE_OTHER): Payer: 59 | Admitting: Physician Assistant

## 2015-06-02 VITALS — BP 136/72 | HR 111 | Temp 102.4°F | Resp 14

## 2015-06-02 DIAGNOSIS — R05 Cough: Secondary | ICD-10-CM | POA: Diagnosis not present

## 2015-06-02 DIAGNOSIS — R112 Nausea with vomiting, unspecified: Secondary | ICD-10-CM

## 2015-06-02 DIAGNOSIS — R6889 Other general symptoms and signs: Secondary | ICD-10-CM | POA: Diagnosis not present

## 2015-06-02 DIAGNOSIS — J101 Influenza due to other identified influenza virus with other respiratory manifestations: Secondary | ICD-10-CM

## 2015-06-02 DIAGNOSIS — R059 Cough, unspecified: Secondary | ICD-10-CM

## 2015-06-02 DIAGNOSIS — E119 Type 2 diabetes mellitus without complications: Secondary | ICD-10-CM | POA: Insufficient documentation

## 2015-06-02 LAB — POCT INFLUENZA A/B
Influenza A, POC: POSITIVE — AB
Influenza B, POC: NEGATIVE

## 2015-06-02 MED ORDER — HYDROCODONE-HOMATROPINE 5-1.5 MG/5ML PO SYRP: 5.0000 mL | ORAL_SOLUTION | Freq: Three times a day (TID) | ORAL | Status: DC | PRN

## 2015-06-02 MED ORDER — PROMETHAZINE HCL 25 MG/ML IJ SOLN: 25.0000 mg | Freq: Once | INTRAMUSCULAR | Status: DC

## 2015-06-02 MED ORDER — PROMETHAZINE HCL 25 MG/ML IJ SOLN
25.0000 mg | Freq: Once | INTRAMUSCULAR | Status: AC
  Administered 2015-06-02: 25 mg via INTRAMUSCULAR

## 2015-06-02 MED ORDER — ONDANSETRON 8 MG PO TBDP: 8.0000 mg | ORAL_TABLET | Freq: Three times a day (TID) | ORAL | Status: DC | PRN

## 2015-06-02 NOTE — Patient Instructions (Signed)

## 2015-06-02 NOTE — Progress Notes (Signed)
Patient: Lisa [name] Female    DOB: 08-15-1960   55 y.o.   MRN: [id number] Visit Date: 06/02/2015  Today's Provider: Mar Daring, PA-C   Chief Complaint  Patient presents with  . Cough   Subjective:    Cough This is a new problem. The current episode started in the past 7 days (Monday night). The problem has been gradually worsening. The problem occurs constantly. The cough is non-productive. Associated symptoms include chills, a fever (Fever in office today is 102.4), headaches, nasal congestion and rhinorrhea. Pertinent negatives include no chest pain, ear congestion, ear pain, postnasal drip, sore throat, shortness of breath or wheezing. Nothing aggravates the symptoms. Treatments tried: Tylenol.       Allergies  Allergen Reactions  . Amoxicillin-Pot Clavulanate   . Ciprofloxacin   . Clarithromycin    Previous Medications   ASPIRIN 81 MG TABLET    Take 1 tablet by mouth daily.   ATORVASTATIN (LIPITOR) 20 MG TABLET    Take 1 tablet (20 mg total) by mouth daily.   BLOOD GLUCOSE MONITORING SUPPL (FIFTY50 GLUCOSE METER 2.0) W/DEVICE KIT    Use as directed.   CYCLOBENZAPRINE (FLEXERIL) 5 MG TABLET    Take 1 tablet (5 mg total) by mouth 3 (three) times daily as needed for muscle spasms.   GLYBURIDE (DIABETA) 2.5 MG TABLET    Take 1 tablet by mouth daily.   INSULIN ASPART (NOVOLOG) 100 UNIT/ML INJECTION    Take 12 units twice daily before meals   INSULIN GLARGINE (LANTUS) 100 UNIT/ML INJECTION    Inject 50 Units into the skin daily.   INSULIN PEN NEEDLE (B-D UF III MINI PEN NEEDLES) 31G X 5 MM MISC    Use 1 needle daily with victoza injection   LIRAGLUTIDE (VICTOZA) 18 MG/3ML SOPN    Take as prescribed. Start with 0.6 mg daily to be increased to 1.8 mg daily within 2 weeks.   LISINOPRIL (PRINIVIL,ZESTRIL) 20 MG TABLET    Take 1 tablet by mouth daily.   MELOXICAM (MOBIC) 15 MG TABLET    Take 1 tablet (15 mg total) by mouth daily.   METFORMIN (GLUMETZA) 1000  MG (MOD) 24 HR TABLET    Take 1,000 mg by mouth daily with breakfast.     MULTIPLE VITAMINS-MINERALS PO    Take 1 tablet by mouth daily.   OMEPRAZOLE (PRILOSEC) 40 MG CAPSULE    Take 1 capsule (40 mg total) by mouth daily.    Review of Systems  Constitutional: Positive for fever (Fever in office today is 102.4) and chills.  HENT: Positive for rhinorrhea, sinus pressure and sneezing. Negative for ear pain, postnasal drip and sore throat.   Respiratory: Positive for cough. Negative for shortness of breath and wheezing.   Cardiovascular: Negative for chest pain and palpitations.  Gastrointestinal: Positive for nausea, vomiting, abdominal pain and diarrhea.  Neurological: Positive for headaches.    Social History  Substance Use Topics  . Smoking status: Never Smoker   . Smokeless tobacco: Not on file  . Alcohol Use: No   Objective:   BP 136/72 mmHg  Pulse 111  Temp(Src) 102.4 F (39.1 C) (Oral)  Resp 14  Wt   SpO2 99%  Physical Exam  Constitutional: She appears well-developed and well-nourished. She appears lethargic. She has a sickly appearance. No distress.  HENT:  Head: Normocephalic and atraumatic.  Right Ear: Hearing, tympanic membrane, external ear and ear canal normal.  Left Ear: Hearing,  tympanic membrane, external ear and ear canal normal.  Nose: Nose normal. Right sinus exhibits no maxillary sinus tenderness and no frontal sinus tenderness. Left sinus exhibits no maxillary sinus tenderness and no frontal sinus tenderness.  Mouth/Throat: Uvula is midline, oropharynx is clear and moist and mucous membranes are normal. No oropharyngeal exudate.  Eyes: Conjunctivae are normal. Pupils are equal, round, and reactive to light. Right eye exhibits no discharge. Left eye exhibits no discharge. No scleral icterus.  Neck: Normal range of motion. Neck supple. No tracheal deviation present. No thyromegaly present.  Cardiovascular: Normal rate, regular rhythm and normal heart sounds.   Exam reveals no gallop and no friction rub.   No murmur heard. Pulmonary/Chest: Effort normal and breath sounds normal. No stridor. No respiratory distress. She has no wheezes. She has no rales.  Lymphadenopathy:    She has no cervical adenopathy.  Neurological: She appears lethargic.  Skin: Skin is warm and dry. She is not diaphoretic.  Vitals reviewed.       Assessment & Plan:     1. Influenza A Flu test was positive. However symptoms onset on Saturday which was greater than the 48 hours recommended for Tamiflu. Advised that her best option would be symptomatic treatment. I advised that she may take Tylenol and ibuprofen interchangeably for her fevers to keep reduced. We did give her ibuprofen today in the office prior to her leaving due to her elevated temperature. We also placed ice pack on her neck to help lower fever as well. She is to return in 1-2 weeks to evaluate how she is doing.  2. Flu-like symptoms Due to body aches, nausea and vomiting and fever we checked for flu. She was flu positive. - POCT Influenza A/B  3. Non-intractable vomiting with nausea, vomiting of unspecified type Promethazine was given in the office today as below due to nausea and vomiting. Her husband was here to be able to drive her home. I also sent a prescription for Zofran as below for nausea and vomiting when she is at home. She is to call the office or go to the hospital if her fever continues or if she develops intractable vomiting. - ondansetron (ZOFRAN-ODT) 8 MG disintegrating tablet; Take 1 tablet (8 mg total) by mouth every 8 (eight) hours as needed for nausea or vomiting.  Dispense: 30 tablet; Refill: 0 - promethazine (PHENERGAN) injection 25 mg; Inject 1 mL (25 mg total) into the muscle once.  4. Cough Hycodan cough syrup was given for cough. She states that she gets into a coughing spell and can't catch her breath which then causes her to vomit. She is to call the office if symptoms fail to  improve or worsen in the meantime. - HYDROcodone-homatropine (HYCODAN) 5-1.5 MG/5ML syrup; Take 5 mLs by mouth every 8 (eight) hours as needed for cough. (Patient not taking: Reported on 06/08/2015)  Dispense: 180 mL; Refill: 0       Mar Daring, PA-C  [facility]

## 2015-06-08 ENCOUNTER — Ambulatory Visit (INDEPENDENT_AMBULATORY_CARE_PROVIDER_SITE_OTHER): Payer: 59 | Admitting: Physician Assistant

## 2015-06-08 ENCOUNTER — Encounter: Payer: Self-pay | Admitting: Physician Assistant

## 2015-06-08 VITALS — BP 128/78 | HR 71 | Temp 99.0°F | Resp 16 | Wt 223.0 lb

## 2015-06-08 DIAGNOSIS — J111 Influenza due to unidentified influenza virus with other respiratory manifestations: Secondary | ICD-10-CM | POA: Diagnosis not present

## 2015-06-08 NOTE — Progress Notes (Signed)
Patient: Lisa [name] Female    DOB: 08/26/1960   55 y.o.   MRN: [id number] Visit Date: 06/08/2015  Today's Provider: Mar Daring, PA-C   Chief Complaint  Patient presents with  . Follow-up   Subjective:    HPI  *Patient was seen and evaluated by Dr. Venia Minks.  I, Grace Bushy, PA-C acted as Education administrator for this note.*  Patient is here to follow-up from last office visit 06/02/2015 patient had Influenza A positive test. Patient states feels a lot better. Cough is getting better, not totally gone takes the Hycodan sometimes as needed, drinking a lot of fluids, mainly water. Patient concern about an abdominal pain that comes and goes for the past week. This is a chronic issue that she is going to see Dr. Venia Minks for as she has seen her for similar symptoms in the past. She feels gassy, bloated, sometimes has constipation symptoms and stools are long and formed when she does have them.     Allergies  Allergen Reactions  . Amoxicillin-Pot Clavulanate   . Ciprofloxacin   . Clarithromycin    Previous Medications   ASPIRIN 81 MG TABLET    Take 1 tablet by mouth daily.   ATORVASTATIN (LIPITOR) 20 MG TABLET    Take 1 tablet (20 mg total) by mouth daily.   BLOOD GLUCOSE MONITORING SUPPL (FIFTY50 GLUCOSE METER 2.0) W/DEVICE KIT    Use as directed.   CYCLOBENZAPRINE (FLEXERIL) 5 MG TABLET    Take 1 tablet (5 mg total) by mouth 3 (three) times daily as needed for muscle spasms.   GLYBURIDE (DIABETA) 2.5 MG TABLET    Take 1 tablet by mouth daily.   HYDROCODONE-HOMATROPINE (HYCODAN) 5-1.5 MG/5ML SYRUP    Take 5 mLs by mouth every 8 (eight) hours as needed for cough.   INSULIN ASPART (NOVOLOG) 100 UNIT/ML INJECTION    Take 12 units twice daily before meals   INSULIN GLARGINE (LANTUS) 100 UNIT/ML INJECTION    Inject 50 Units into the skin daily.   INSULIN PEN NEEDLE (B-D UF III MINI PEN NEEDLES) 31G X 5 MM MISC    Use 1 needle daily with victoza injection   LIRAGLUTIDE  (VICTOZA) 18 MG/3ML SOPN    Take as prescribed. Start with 0.6 mg daily to be increased to 1.8 mg daily within 2 weeks.   LISINOPRIL (PRINIVIL,ZESTRIL) 20 MG TABLET    Take 1 tablet by mouth daily.   MELOXICAM (MOBIC) 15 MG TABLET    Take 1 tablet (15 mg total) by mouth daily.   METFORMIN (GLUMETZA) 1000 MG (MOD) 24 HR TABLET    Take 1,000 mg by mouth daily with breakfast.     MULTIPLE VITAMINS-MINERALS PO    Take 1 tablet by mouth daily.   OMEPRAZOLE (PRILOSEC) 40 MG CAPSULE    Take 1 capsule (40 mg total) by mouth daily.   ONDANSETRON (ZOFRAN-ODT) 8 MG DISINTEGRATING TABLET    Take 1 tablet (8 mg total) by mouth every 8 (eight) hours as needed for nausea or vomiting.    Review of Systems  Constitutional: Negative.   HENT: Negative.   Eyes: Negative.   Respiratory: Positive for cough. Negative for chest tightness, shortness of breath and wheezing.   Cardiovascular: Negative for chest pain, palpitations and leg swelling.  Gastrointestinal: Positive for abdominal pain and constipation.  Endocrine: Negative.   Genitourinary: Negative.   Musculoskeletal: Negative.   Skin: Negative.   Allergic/Immunologic: Negative.   Neurological: Negative.  Hematological: Negative.   Psychiatric/Behavioral: Negative.     Social History  Substance Use Topics  . Smoking status: Never Smoker   . Smokeless tobacco: Not on file  . Alcohol Use: No   Objective:   BP 128/78 mmHg  Pulse 71  Temp(Src) 99 F (37.2 C) (Oral)  Resp 16  Wt 223 lb (101.152 kg)  SpO2 98%  Physical Exam  Constitutional: She appears well-developed and well-nourished. No distress.  HENT:  Head: Normocephalic and atraumatic.  Right Ear: Hearing, tympanic membrane, external ear and ear canal normal.  Left Ear: Hearing, tympanic membrane, external ear and ear canal normal.  Nose: Nose normal. Right sinus exhibits no maxillary sinus tenderness and no frontal sinus tenderness. Left sinus exhibits no maxillary sinus tenderness  and no frontal sinus tenderness.  Mouth/Throat: Uvula is midline, oropharynx is clear and moist and mucous membranes are normal. No oropharyngeal exudate, posterior oropharyngeal edema or posterior oropharyngeal erythema.  Eyes: Conjunctivae are normal. Pupils are equal, round, and reactive to light. Right eye exhibits no discharge. Left eye exhibits no discharge. No scleral icterus.  Neck: Normal range of motion. Neck supple. No tracheal deviation present. No thyromegaly present.  Cardiovascular: Normal rate, regular rhythm and normal heart sounds.  Exam reveals no gallop and no friction rub.   No murmur heard. Pulmonary/Chest: Effort normal and breath sounds normal. No stridor. No respiratory distress. She has no wheezes. She has no rales.  Lymphadenopathy:    She has no cervical adenopathy.  Skin: Skin is warm and dry. She is not diaphoretic.  Vitals reviewed.       Assessment & Plan:     1. Flu Follow up for flu diagnosis from last week. Improving. Continue current symptomatic treatment. Work note given to release back to work. Call if no improvements.  Schedule future follow up with Dr. Venia Minks for abdominal pain work-up.  Call if symptoms worsen.       Mar Daring, PA-C  [facility]

## 2015-09-20 ENCOUNTER — Ambulatory Visit (INDEPENDENT_AMBULATORY_CARE_PROVIDER_SITE_OTHER): Payer: 59 | Admitting: Family Medicine

## 2015-09-20 ENCOUNTER — Encounter: Payer: Self-pay | Admitting: Family Medicine

## 2015-09-20 VITALS — BP 126/80 | HR 80 | Temp 98.1°F | Resp 16 | Ht 69.0 in | Wt 223.0 lb

## 2015-09-20 DIAGNOSIS — I10 Essential (primary) hypertension: Secondary | ICD-10-CM

## 2015-09-20 DIAGNOSIS — Z1211 Encounter for screening for malignant neoplasm of colon: Secondary | ICD-10-CM | POA: Diagnosis not present

## 2015-09-20 DIAGNOSIS — E7800 Pure hypercholesterolemia, unspecified: Secondary | ICD-10-CM

## 2015-09-20 DIAGNOSIS — Z1239 Encounter for other screening for malignant neoplasm of breast: Secondary | ICD-10-CM

## 2015-09-20 DIAGNOSIS — Z0000 Encounter for general adult medical examination without abnormal findings: Secondary | ICD-10-CM | POA: Diagnosis not present

## 2015-09-20 DIAGNOSIS — Z124 Encounter for screening for malignant neoplasm of cervix: Secondary | ICD-10-CM

## 2015-09-20 DIAGNOSIS — Z23 Encounter for immunization: Secondary | ICD-10-CM

## 2015-09-20 LAB — POCT URINALYSIS DIPSTICK
Bilirubin, UA: NEGATIVE
Blood, UA: NEGATIVE
Glucose, UA: NEGATIVE
Ketones, UA: NEGATIVE
Leukocytes, UA: NEGATIVE
Nitrite, UA: POSITIVE
Protein, UA: NEGATIVE
Spec Grav, UA: 1.01
Urobilinogen, UA: 0.2
pH, UA: 6

## 2015-09-20 MED ORDER — LISINOPRIL 20 MG PO TABS: 20.0000 mg | ORAL_TABLET | Freq: Every day | ORAL | Status: DC

## 2015-09-20 NOTE — Progress Notes (Signed)
Patient ID: Lisa [name], female   DOB: 12/11/1960, 55 y.o.   MRN: [id number]       Patient: Lisa [name], Female    DOB: 08-05-1960, 55 y.o.   MRN: [id number] Visit Date: 09/20/2015  Today's Provider: Margarita Rana, MD   Chief Complaint  Patient presents with  . Annual Exam   Subjective:    Annual physical exam Lisa [name] is a 55 y.o. female who presents today for health maintenance and complete physical. She feels well. She reports exercising active with daily activities. She reports she is sleeping fairly well.  02/02/14 CPE 11/12/12 Pap-neg 11/18/14 Mammogram-BI-RADS 1 -----------------------------------------------------------------   Review of Systems  Constitutional: Positive for unexpected weight change.  HENT: Negative.   Eyes: Negative.   Respiratory: Negative.   Cardiovascular: Negative.   Gastrointestinal: Negative.   Endocrine: Negative.   Genitourinary: Negative.   Musculoskeletal: Negative.   Skin: Negative.   Allergic/Immunologic: Negative.   Neurological: Negative.   Hematological: Negative.   Psychiatric/Behavioral: Negative.     Social History      She  reports that she has never smoked. She has never used smokeless tobacco. She reports that she does not drink alcohol or use illicit drugs.       Social History   Social History  . Marital Status: Married    Spouse Name: N/A  . Number of Children: 1  . Years of Education: N/A   Social History Main Topics  . Smoking status: Never Smoker   . Smokeless tobacco: Never Used  . Alcohol Use: No  . Drug Use: No  . Sexual Activity: Not Asked   Other Topics Concern  . None   Social History Narrative    Past Medical History  Diagnosis Date  . Diabetes mellitus      Patient Active Problem List   Diagnosis Date Noted  . Diabetes mellitus ([place]) 06/02/2015  . Adaptation reaction 02/15/2015  . Allergic rhinitis 02/15/2015  . Diabetes mellitus, type 2 ([place])  02/15/2015  . Bite from dog 02/15/2015  . Foot pain 02/15/2015  . Big thyroid 02/15/2015  . BP (high blood pressure) 02/15/2015  . Hypercholesteremia 02/15/2015  . Diabetes ([place]) 02/15/2015  . Gonalgia 02/15/2015  . LBP (low back pain) 02/15/2015  . Hernia of anterior abdominal wall 02/15/2015  . Dermatophytosis of groin 02/15/2015  . Candida vaginitis 02/15/2015  . Acid reflux 11/03/2014  . Abdominal lump 11/19/2012  . Hernia, lateral ventral 11/19/2012  . Intra-abdominal and pelvic swelling, mass and lump, unspecified site 11/19/2012  . Adnexal mass 11/06/2012    Past Surgical History  Procedure Laterality Date  . Gallbladder surgery    . Abdominal hysterectomy      2009  . Knee surgery  12/2011    total knee replacement Dr. Duayne Cal  . Cholecystectomy  1997  . Tubal ligation    . Hernia repair  9/37/1696    periumbilical with right ooph    Family History        Family Status  Relation Status Death Age  . Sister Alive   . Brother Alive   . Sister Alive   . Sister Alive   . Sister Alive   . Brother Alive   . Mother Deceased   . Father Deceased         Her family history includes Diabetes in her mother, sister, sister, and sister; Heart disease in her father.    Allergies  Allergen Reactions  . Amoxicillin-Pot Clavulanate   .  Ciprofloxacin   . Clarithromycin     Previous Medications   ASPIRIN 81 MG TABLET    Take 1 tablet by mouth daily.   ATORVASTATIN (LIPITOR) 20 MG TABLET    Take 1 tablet (20 mg total) by mouth daily.   BLOOD GLUCOSE MONITORING SUPPL (FIFTY50 GLUCOSE METER 2.0) W/DEVICE KIT    Use as directed.   GLYBURIDE (DIABETA) 2.5 MG TABLET    Take 1 tablet by mouth daily.   INSULIN ASPART (NOVOLOG) 100 UNIT/ML INJECTION    Take 12 units twice daily before meals   INSULIN GLARGINE (LANTUS) 100 UNIT/ML INJECTION    Inject 50 Units into the skin daily.   INSULIN PEN NEEDLE (B-D UF III MINI PEN NEEDLES) 31G X 5 MM MISC    Use 1 needle daily with victoza  injection   METFORMIN (GLUMETZA) 1000 MG (MOD) 24 HR TABLET    Take 1,000 mg by mouth daily with breakfast.     MULTIPLE VITAMINS-MINERALS PO    Take 1 tablet by mouth daily.   OMEPRAZOLE (PRILOSEC) 40 MG CAPSULE    Take 1 capsule (40 mg total) by mouth daily.   ONDANSETRON (ZOFRAN-ODT) 8 MG DISINTEGRATING TABLET    Take 1 tablet (8 mg total) by mouth every 8 (eight) hours as needed for nausea or vomiting.    Patient Care Team: Margarita Rana, MD as PCP - General (Family Medicine)     Objective:   Vitals: BP 126/80 mmHg  Pulse 80  Temp(Src) 98.1 F (36.7 C) (Oral)  Resp 16  Ht 5' 9"  (1.753 m)  Wt 223 lb (101.152 kg)  BMI 32.92 kg/m2   Physical Exam  Constitutional: She is oriented to person, place, and time. She appears well-developed and well-nourished.  HENT:  Head: Normocephalic and atraumatic.  Right Ear: Tympanic membrane, external ear and ear canal normal.  Left Ear: Tympanic membrane, external ear and ear canal normal.  Nose: Nose normal.  Mouth/Throat: Uvula is midline, oropharynx is clear and moist and mucous membranes are normal.  Eyes: Conjunctivae, EOM and lids are normal. Pupils are equal, round, and reactive to light.  Neck: Trachea normal and normal range of motion. Neck supple. Carotid bruit is not present. No thyroid mass and no thyromegaly present.  Cardiovascular: Normal rate, regular rhythm and normal heart sounds.   Pulmonary/Chest: Effort normal and breath sounds normal.  Abdominal: Soft. Normal appearance and bowel sounds are normal. There is no hepatosplenomegaly. There is no tenderness.  Genitourinary: Vagina normal. No breast swelling, tenderness or discharge.  Musculoskeletal: Normal range of motion.  Lymphadenopathy:    She has no cervical adenopathy.    She has no axillary adenopathy.  Neurological: She is alert and oriented to person, place, and time. She has normal strength. No cranial nerve deficit.  Skin: Skin is warm, dry and intact.    Psychiatric: She has a normal mood and affect. Her speech is normal and behavior is normal. Judgment and thought content normal. Cognition and memory are normal.    Depression Screen PHQ 2/9 Scores 09/20/2015  PHQ - 2 Score 0    Assessment & Plan:     Routine Health Maintenance and Physical Exam  Exercise Activities and Dietary recommendations Goals    None      Immunization History  Administered Date(s) Administered  . Pneumococcal Polysaccharide-23 09/20/2015       1. Annual physical exam Stable. Patient advised to continue eating healthy and exercise daily. - POCT urinalysis dipstick  2.  Breast cancer screening - MM DIGITAL SCREENING BILATERAL; Future  3. Colon cancer screening - Ambulatory referral to Gastroenterology  4. Essential hypertension - CBC with Differential/Platelet - Comprehensive metabolic panel - lisinopril (PRINIVIL,ZESTRIL) 20 MG tablet; Take 1 tablet (20 mg total) by mouth daily.  Dispense: 90 tablet; Refill: 3  5. Hypercholesteremia - Lipid Panel With LDL/HDL Ratio - TSH  6. Cervical cancer screening F/U pending lab report. - Pap IG and HPV (high risk) DNA detection  7. Need for pneumococcal vaccination - Pneumococcal polysaccharide vaccine 23-valent greater than or equal to 2yo subcutaneous/IM   Patient seen and examined by Dr. Jerrell Belfast, and note scribed by Deardorff Riser. Dimas, CMA.  I have reviewed the document for accuracy and completeness and I agree with above. Jerrell Belfast, MD   Margarita Rana, MD   --------------------------------------------------------------------

## 2015-09-20 NOTE — Patient Instructions (Signed)
Patient seen and examined by Dr. Jerrell Belfast, and note scribed by Parrott Riser. Damica Gravlin, CMA.

## 2015-09-23 ENCOUNTER — Telehealth: Payer: Self-pay

## 2015-09-23 DIAGNOSIS — R748 Abnormal levels of other serum enzymes: Secondary | ICD-10-CM

## 2015-09-23 HISTORY — DX: Abnormal levels of other serum enzymes: R74.8

## 2015-09-23 LAB — CBC WITH DIFFERENTIAL/PLATELET
Basophils Absolute: 0 10*3/uL (ref 0.0–0.2)
Basos: 0 %
EOS (ABSOLUTE): 0.2 10*3/uL (ref 0.0–0.4)
Eos: 2 %
Hematocrit: 39.1 % (ref 34.0–46.6)
Hemoglobin: 12.4 g/dL (ref 11.1–15.9)
Immature Grans (Abs): 0 10*3/uL (ref 0.0–0.1)
Immature Granulocytes: 0 %
Lymphocytes Absolute: 3.9 10*3/uL — ABNORMAL HIGH (ref 0.7–3.1)
Lymphs: 44 %
MCH: 26.1 pg — ABNORMAL LOW (ref 26.6–33.0)
MCHC: 31.7 g/dL (ref 31.5–35.7)
MCV: 82 fL (ref 79–97)
Monocytes Absolute: 0.6 10*3/uL (ref 0.1–0.9)
Monocytes: 6 %
Neutrophils Absolute: 4.2 10*3/uL (ref 1.4–7.0)
Neutrophils: 48 %
Platelets: 210 10*3/uL (ref 150–379)
RBC: 4.76 x10E6/uL (ref 3.77–5.28)
RDW: 14 % (ref 12.3–15.4)
WBC: 8.9 10*3/uL (ref 3.4–10.8)

## 2015-09-23 LAB — COMPREHENSIVE METABOLIC PANEL
ALT: 127 IU/L — ABNORMAL HIGH (ref 0–32)
AST: 88 [IU]/L — ABNORMAL HIGH (ref 0–40)
Albumin/Globulin Ratio: 1 — ABNORMAL LOW (ref 1.2–2.2)
Albumin: 4.3 g/dL (ref 3.5–5.5)
Alkaline Phosphatase: 577 [IU]/L — ABNORMAL HIGH (ref 39–117)
BUN/Creatinine Ratio: 19 (ref 9–23)
BUN: 15 mg/dL (ref 6–24)
Bilirubin Total: 0.6 mg/dL (ref 0.0–1.2)
CO2: 26 mmol/L (ref 18–29)
Calcium: 9.9 mg/dL (ref 8.7–10.2)
Chloride: 99 mmol/L (ref 96–106)
Creatinine, Ser: 0.79 mg/dL (ref 0.57–1.00)
GFR calc Af Amer: 99 mL/min/{1.73_m2} (ref >59)
GFR calc non Af Amer: 86 mL/min/{1.73_m2} (ref >59)
Globulin, Total: 4.1 g/dL (ref 1.5–4.5)
Glucose: 74 mg/dL (ref 65–99)
Potassium: 4.1 mmol/L (ref 3.5–5.2)
Sodium: 143 mmol/L (ref 134–144)
Total Protein: 8.4 g/dL (ref 6.0–8.5)

## 2015-09-23 LAB — LIPID PANEL WITH LDL/HDL RATIO
Cholesterol, Total: 305 mg/dL — ABNORMAL HIGH (ref 100–199)
HDL: 84 mg/dL (ref >39)
LDL Calculated: 205 mg/dL — ABNORMAL HIGH (ref 0–99)
LDl/HDL Ratio: 2.4 ratio units (ref 0.0–3.2)
Triglycerides: 82 mg/dL (ref 0–149)
VLDL Cholesterol Cal: 16 mg/dL (ref 5–40)

## 2015-09-23 LAB — PAP IG AND HPV HIGH-RISK
HPV, high-risk: POSITIVE — AB
PAP Smear Comment: 0

## 2015-09-23 LAB — TSH: TSH: 1.13 u[IU]/mL (ref 0.450–4.500)

## 2015-09-23 NOTE — Telephone Encounter (Signed)
Advised pt of lab results. Pt verbally acknowledges understanding. Pt states she ran out of cholesterol medication; will restart this. Also, GI referral ordered per verbal order from Dr. Venia Minks. Renaldo Fiddler, CMA

## 2015-09-23 NOTE — Telephone Encounter (Signed)
-----   Message from Margarita Rana, MD sent at 09/23/2015  6:47 AM EDT ----- Labs with multiple abnormalities.   Pap with no cancer cells, but positive HPV. Very important to repeat Pap smear in one year.   Cholesterol very high. Liver enzymes elevated from previous.  Please see if this has been worked up before.  Thanks.

## 2015-09-28 ENCOUNTER — Other Ambulatory Visit: Payer: Self-pay | Admitting: Nurse Practitioner

## 2015-09-28 DIAGNOSIS — R945 Abnormal results of liver function studies: Secondary | ICD-10-CM

## 2015-09-28 LAB — HM HIV SCREENING LAB: HM HIV Screening: NEGATIVE

## 2015-10-01 ENCOUNTER — Ambulatory Visit
Admission: RE | Admit: 2015-10-01 | Discharge: 2015-10-01 | Disposition: A | Discharge status: Home / Self Care | Payer: 59 | Source: Ambulatory Visit | Attending: Nurse Practitioner | Admitting: Nurse Practitioner

## 2015-10-01 DIAGNOSIS — R945 Abnormal results of liver function studies: Secondary | ICD-10-CM | POA: Diagnosis not present

## 2015-10-01 DIAGNOSIS — R932 Abnormal findings on diagnostic imaging of liver and biliary tract: Secondary | ICD-10-CM | POA: Insufficient documentation

## 2015-10-01 DIAGNOSIS — Z9049 Acquired absence of other specified parts of digestive tract: Secondary | ICD-10-CM | POA: Diagnosis not present

## 2015-12-20 ENCOUNTER — Other Ambulatory Visit: Payer: Self-pay | Admitting: Family Medicine

## 2015-12-20 DIAGNOSIS — K219 Gastro-esophageal reflux disease without esophagitis: Secondary | ICD-10-CM

## 2015-12-27 ENCOUNTER — Other Ambulatory Visit: Payer: Self-pay | Admitting: Nurse Practitioner

## 2015-12-27 ENCOUNTER — Other Ambulatory Visit: Payer: Self-pay | Admitting: Gynecology

## 2015-12-27 DIAGNOSIS — R945 Abnormal results of liver function studies: Principal | ICD-10-CM

## 2015-12-27 DIAGNOSIS — R7989 Other specified abnormal findings of blood chemistry: Secondary | ICD-10-CM

## 2015-12-27 DIAGNOSIS — R1084 Generalized abdominal pain: Secondary | ICD-10-CM

## 2015-12-29 ENCOUNTER — Other Ambulatory Visit: Payer: Self-pay | Admitting: Nurse Practitioner

## 2015-12-29 DIAGNOSIS — R7989 Other specified abnormal findings of blood chemistry: Secondary | ICD-10-CM

## 2015-12-29 DIAGNOSIS — R945 Abnormal results of liver function studies: Principal | ICD-10-CM

## 2016-01-03 ENCOUNTER — Other Ambulatory Visit: Payer: Self-pay | Admitting: Radiology

## 2016-01-04 ENCOUNTER — Ambulatory Visit
Admission: RE | Admit: 2016-01-04 | Discharge: 2016-01-04 | Disposition: A | Discharge status: Home / Self Care | Payer: 59 | Source: Ambulatory Visit | Attending: Nurse Practitioner | Admitting: Nurse Practitioner

## 2016-01-04 DIAGNOSIS — Z880 Allergy status to penicillin: Secondary | ICD-10-CM | POA: Insufficient documentation

## 2016-01-04 DIAGNOSIS — K740 Hepatic fibrosis: Secondary | ICD-10-CM | POA: Diagnosis not present

## 2016-01-04 DIAGNOSIS — Z7984 Long term (current) use of oral hypoglycemic drugs: Secondary | ICD-10-CM | POA: Insufficient documentation

## 2016-01-04 DIAGNOSIS — E119 Type 2 diabetes mellitus without complications: Secondary | ICD-10-CM | POA: Insufficient documentation

## 2016-01-04 DIAGNOSIS — I10 Essential (primary) hypertension: Secondary | ICD-10-CM | POA: Diagnosis not present

## 2016-01-04 DIAGNOSIS — R7989 Other specified abnormal findings of blood chemistry: Secondary | ICD-10-CM | POA: Diagnosis present

## 2016-01-04 DIAGNOSIS — K760 Fatty (change of) liver, not elsewhere classified: Secondary | ICD-10-CM | POA: Diagnosis not present

## 2016-01-04 DIAGNOSIS — Z7982 Long term (current) use of aspirin: Secondary | ICD-10-CM | POA: Diagnosis not present

## 2016-01-04 DIAGNOSIS — Z8249 Family history of ischemic heart disease and other diseases of the circulatory system: Secondary | ICD-10-CM | POA: Diagnosis not present

## 2016-01-04 DIAGNOSIS — Z9049 Acquired absence of other specified parts of digestive tract: Secondary | ICD-10-CM | POA: Insufficient documentation

## 2016-01-04 DIAGNOSIS — R945 Abnormal results of liver function studies: Secondary | ICD-10-CM

## 2016-01-04 DIAGNOSIS — Z794 Long term (current) use of insulin: Secondary | ICD-10-CM | POA: Insufficient documentation

## 2016-01-04 DIAGNOSIS — M1990 Unspecified osteoarthritis, unspecified site: Secondary | ICD-10-CM | POA: Insufficient documentation

## 2016-01-04 DIAGNOSIS — Z96659 Presence of unspecified artificial knee joint: Secondary | ICD-10-CM | POA: Insufficient documentation

## 2016-01-04 DIAGNOSIS — Z90710 Acquired absence of both cervix and uterus: Secondary | ICD-10-CM | POA: Diagnosis not present

## 2016-01-04 DIAGNOSIS — Z833 Family history of diabetes mellitus: Secondary | ICD-10-CM | POA: Diagnosis not present

## 2016-01-04 DIAGNOSIS — Z881 Allergy status to other antibiotic agents status: Secondary | ICD-10-CM | POA: Insufficient documentation

## 2016-01-04 HISTORY — DX: Essential (primary) hypertension: I10

## 2016-01-04 HISTORY — DX: Unspecified osteoarthritis, unspecified site: M19.90

## 2016-01-04 LAB — CBC
HCT: 42.5 % (ref 35.0–47.0)
Hemoglobin: 14.4 g/dL (ref 12.0–16.0)
MCH: 26.9 pg (ref 26.0–34.0)
MCHC: 33.8 g/dL (ref 32.0–36.0)
MCV: 79.7 fL — ABNORMAL LOW (ref 80.0–100.0)
Platelets: 169 10*3/uL (ref 150–440)
RBC: 5.34 MIL/uL — ABNORMAL HIGH (ref 3.80–5.20)
RDW: 14.1 % (ref 11.5–14.5)
WBC: 10.9 10*3/uL (ref 3.6–11.0)

## 2016-01-04 LAB — PROTIME-INR
INR: 0.89
Prothrombin Time: 12 seconds (ref 11.4–15.2)

## 2016-01-04 LAB — APTT: aPTT: 32 seconds (ref 24–36)

## 2016-01-04 MED ORDER — SODIUM CHLORIDE 0.9 % IV SOLN
INTRAVENOUS | Status: DC
  Administered 2016-01-04: 10:00:00 via INTRAVENOUS

## 2016-01-04 MED ORDER — FENTANYL CITRATE (PF) 100 MCG/2ML IJ SOLN
INTRAMUSCULAR | Status: AC | PRN
  Administered 2016-01-04: 25 ug via INTRAVENOUS
  Administered 2016-01-04: 50 ug via INTRAVENOUS

## 2016-01-04 MED ORDER — MIDAZOLAM HCL 5 MG/5ML IJ SOLN
INTRAMUSCULAR | Status: AC | PRN
  Administered 2016-01-04: 0.5 mg via INTRAVENOUS
  Administered 2016-01-04: 1 mg via INTRAVENOUS
  Administered 2016-01-04: 0.5 mg via INTRAVENOUS

## 2016-01-04 NOTE — Procedures (Signed)
Random liver biopsy 18 g times 5 No comp/EBL

## 2016-01-04 NOTE — H&P (Signed)
Chief Complaint: Patient was seen in consultation today for No chief complaint on file.  at the request of Mills,Kimberly A  Referring Physician(s): Mills,Kimberly A  Supervising Physician: Marybelle Killings  Patient Status: Outpatient  History of Present Illness: Lisa [name] is a 55 y.o. female with recent elevation in LFTs. No history of liver problems. She has DM and is on lipid lowering meds.  Past Medical History:  Diagnosis Date  . Arthritis   . Diabetes mellitus   . Hypertension     Past Surgical History:  Procedure Laterality Date  . ABDOMINAL HYSTERECTOMY     2009  . CHOLECYSTECTOMY  1997  . GALLBLADDER SURGERY    . HERNIA REPAIR  5/62/5638   periumbilical with right ooph  . KNEE SURGERY  12/2011   total knee replacement Dr. Duayne Cal  . TUBAL LIGATION      Allergies: Amoxicillin-pot clavulanate; Ciprofloxacin; and Clarithromycin  Medications: Prior to Admission medications   Medication Sig Start Date End Date Taking? Authorizing Provider  metFORMIN (GLUMETZA) 1000 MG (MOD) 24 hr tablet Take 1,000 mg by mouth daily with breakfast.     Yes Historical Provider, MD  MULTIPLE VITAMINS-MINERALS PO Take 1 tablet by mouth daily.   Yes Historical Provider, MD  omeprazole (PRILOSEC) 40 MG capsule Take 1 capsule by mouth  daily 12/20/15  Yes Mar Daring, PA-C  aspirin 81 MG tablet Take 1 tablet by mouth daily.    Historical Provider, MD  atorvastatin (LIPITOR) 20 MG tablet Take 1 tablet (20 mg total) by mouth daily. 02/16/15   Margarita Rana, MD  Blood Glucose Monitoring Suppl (FIFTY50 GLUCOSE METER 2.0) W/DEVICE KIT Use as directed. 03/17/14   Historical Provider, MD  glyBURIDE (DIABETA) 2.5 MG tablet Take 1 tablet by mouth daily. 10/08/13   Historical Provider, MD  insulin aspart (NOVOLOG) 100 UNIT/ML injection Take 12 units twice daily before meals 02/22/15   Historical Provider, MD  insulin glargine (LANTUS) 100 UNIT/ML injection Inject 50 Units into the  skin daily.    Historical Provider, MD  Insulin Pen Needle (B-D UF III MINI PEN NEEDLES) 31G X 5 MM MISC Use 1 needle daily with victoza injection 02/01/15   Historical Provider, MD  lisinopril (PRINIVIL,ZESTRIL) 20 MG tablet Take 1 tablet (20 mg total) by mouth daily. 09/20/15   Margarita Rana, MD  ondansetron (ZOFRAN-ODT) 8 MG disintegrating tablet Take 1 tablet (8 mg total) by mouth every 8 (eight) hours as needed for nausea or vomiting. Patient not taking: Reported on 01/04/2016 06/02/15   Mar Daring, PA-C     Family History  Problem Relation Age of Onset  . Diabetes Sister   . Diabetes Sister   . Diabetes Sister   . Diabetes Mother   . Heart disease Father     Social History   Social History  . Marital status: Married    Spouse name: N/A  . Number of children: 1  . Years of education: N/A   Social History Main Topics  . Smoking status: Never Smoker  . Smokeless tobacco: Never Used  . Alcohol use No  . Drug use: No  . Sexual activity: Not Asked   Other Topics Concern  . None   Social History Narrative  . None     Review of Systems: A 12 point ROS discussed and pertinent positives are indicated in the HPI above.  All other systems are negative.  Review of Systems  Vital Signs: BP 131/85  Pulse 88   Temp 98.4 F (36.9 C) (Oral)   Resp 15   Ht _0  (1.727 m)   Wt 221 lb (100.2 kg)   SpO2 96%   BMI 33.60 kg/m   Physical Exam  Constitutional: She is oriented to person, place, and time. She appears well-developed and well-nourished.  Neck: Normal range of motion.  Cardiovascular: Normal rate and regular rhythm.   Pulmonary/Chest: Effort normal and breath sounds normal.  Neurological: She is alert and oriented to person, place, and time.  Skin: Skin is warm and dry.    Mallampati Score: 1    Imaging: No results found.  Labs:  CBC:  Recent Labs  09/22/15 0917 01/04/16 0840  WBC 8.9 10.9  HGB  --  14.4  HCT 39.1 42.5  PLT 210 169     COAGS: No results for input(s): INR, APTT in the last 8760 hours.  BMP:  Recent Labs  09/22/15 0917  NA 143  K 4.1  CL 99  CO2 26  GLUCOSE 74  BUN 15  CALCIUM 9.9  CREATININE 0.79  GFRNONAA 86  GFRAA 99    LIVER FUNCTION TESTS:  Recent Labs  09/22/15 0917  BILITOT 0.6  AST 88*  ALT 127*  ALKPHOS 577*  PROT 8.4  ALBUMIN 4.3    TUMOR MARKERS: No results for input(s): AFPTM, CEA, CA199, CHROMGRNA in the last 8760 hours.  Assessment and Plan:  Liver biopsy for elevated LFTs.  Thank you for this interesting consult.  I greatly enjoyed meeting [name] and look forward to participating in their care.  A copy of this report was sent to the requesting provider on this date.  Electronically Signed: Bernhard Koskinen, ART A 01/04/2016, 9:27 AM   I spent a total of  30 Minutes   in face to face in clinical consultation, greater than 50% of which was counseling/coordinating care for liver biopsy.

## 2016-01-06 LAB — SURGICAL PATHOLOGY

## 2016-01-12 ENCOUNTER — Other Ambulatory Visit: Payer: Self-pay | Admitting: Nurse Practitioner

## 2016-01-12 ENCOUNTER — Ambulatory Visit
Admission: RE | Admit: 2016-01-12 | Discharge: 2016-01-12 | Disposition: A | Discharge status: Home / Self Care | Payer: 59 | Source: Ambulatory Visit | Attending: Nurse Practitioner | Admitting: Nurse Practitioner

## 2016-01-12 ENCOUNTER — Ambulatory Visit: Payer: 59

## 2016-01-12 DIAGNOSIS — R932 Abnormal findings on diagnostic imaging of liver and biliary tract: Secondary | ICD-10-CM | POA: Insufficient documentation

## 2016-01-12 DIAGNOSIS — R7989 Other specified abnormal findings of blood chemistry: Secondary | ICD-10-CM

## 2016-01-12 DIAGNOSIS — R945 Abnormal results of liver function studies: Secondary | ICD-10-CM | POA: Insufficient documentation

## 2016-01-12 LAB — POCT I-STAT CREATININE: Creatinine, Ser: 0.7 mg/dL (ref 0.44–1.00)

## 2016-01-12 MED ORDER — GADOBENATE DIMEGLUMINE 529 MG/ML IV SOLN
20.0000 mL | Freq: Once | INTRAVENOUS | Status: AC | PRN
  Administered 2016-01-12: 20 mL via INTRAVENOUS

## 2016-01-14 DIAGNOSIS — R109 Unspecified abdominal pain: Secondary | ICD-10-CM | POA: Insufficient documentation

## 2016-01-14 DIAGNOSIS — K760 Fatty (change of) liver, not elsewhere classified: Secondary | ICD-10-CM | POA: Insufficient documentation

## 2016-01-14 DIAGNOSIS — K743 Primary biliary cirrhosis: Secondary | ICD-10-CM | POA: Insufficient documentation

## 2016-01-31 ENCOUNTER — Other Ambulatory Visit: Payer: Self-pay | Admitting: Physician Assistant

## 2016-01-31 DIAGNOSIS — Z1231 Encounter for screening mammogram for malignant neoplasm of breast: Secondary | ICD-10-CM

## 2016-02-24 ENCOUNTER — Other Ambulatory Visit: Payer: Self-pay | Admitting: Physician Assistant

## 2016-02-24 ENCOUNTER — Ambulatory Visit
Admission: RE | Admit: 2016-02-24 | Discharge: 2016-02-24 | Disposition: A | Discharge status: Home / Self Care | Payer: 59 | Source: Ambulatory Visit | Attending: Physician Assistant | Admitting: Physician Assistant

## 2016-02-24 DIAGNOSIS — Z1231 Encounter for screening mammogram for malignant neoplasm of breast: Secondary | ICD-10-CM | POA: Diagnosis not present

## 2016-02-28 ENCOUNTER — Other Ambulatory Visit: Payer: Self-pay | Admitting: Physician Assistant

## 2016-02-28 DIAGNOSIS — N6489 Other specified disorders of breast: Secondary | ICD-10-CM

## 2016-03-06 ENCOUNTER — Ambulatory Visit: Payer: Self-pay | Admitting: Dietician

## 2016-03-07 ENCOUNTER — Encounter: Payer: Self-pay | Admitting: Physician Assistant

## 2016-03-13 DIAGNOSIS — K7581 Nonalcoholic steatohepatitis (NASH): Secondary | ICD-10-CM | POA: Insufficient documentation

## 2016-03-16 ENCOUNTER — Other Ambulatory Visit: Payer: Self-pay

## 2016-03-16 ENCOUNTER — Ambulatory Visit: Payer: 59 | Attending: Physician Assistant

## 2016-03-24 ENCOUNTER — Encounter: Payer: Self-pay | Admitting: *Deleted

## 2016-03-27 ENCOUNTER — Ambulatory Visit: Admission: RE | Admit: 2016-03-27 | Payer: 59 | Source: Ambulatory Visit | Admitting: Unknown Physician Specialty

## 2016-03-27 ENCOUNTER — Encounter: Admission: RE | Payer: Self-pay | Source: Ambulatory Visit

## 2016-03-27 HISTORY — DX: Unspecified cirrhosis of liver: K74.60

## 2016-03-27 HISTORY — DX: Gastro-esophageal reflux disease without esophagitis: K21.9

## 2016-03-27 SURGERY — COLONOSCOPY WITH PROPOFOL
Anesthesia: General

## 2016-03-28 ENCOUNTER — Encounter: Payer: Self-pay | Admitting: Dietician

## 2016-03-28 NOTE — Progress Notes (Signed)
Have not heard from patient; sent no-show letter to MD.

## 2016-04-26 ENCOUNTER — Ambulatory Visit
Admission: RE | Admit: 2016-04-26 | Discharge: 2016-04-26 | Disposition: A | Discharge status: Home / Self Care | Payer: 59 | Source: Ambulatory Visit | Attending: Physician Assistant | Admitting: Physician Assistant

## 2016-04-26 DIAGNOSIS — N6489 Other specified disorders of breast: Secondary | ICD-10-CM | POA: Diagnosis present

## 2016-06-07 DIAGNOSIS — H0014 Chalazion left upper eyelid: Secondary | ICD-10-CM | POA: Diagnosis not present

## 2016-06-21 ENCOUNTER — Ambulatory Visit: Payer: 59 | Admitting: Physician Assistant

## 2016-06-22 DIAGNOSIS — Z794 Long term (current) use of insulin: Secondary | ICD-10-CM | POA: Diagnosis not present

## 2016-06-22 DIAGNOSIS — E1165 Type 2 diabetes mellitus with hyperglycemia: Secondary | ICD-10-CM | POA: Diagnosis not present

## 2016-06-27 ENCOUNTER — Ambulatory Visit (INDEPENDENT_AMBULATORY_CARE_PROVIDER_SITE_OTHER): Payer: 59 | Admitting: Physician Assistant

## 2016-06-27 ENCOUNTER — Encounter: Payer: Self-pay | Admitting: Physician Assistant

## 2016-06-27 VITALS — BP 126/68 | HR 72 | Temp 98.9°F | Resp 16 | Wt 218.0 lb

## 2016-06-27 DIAGNOSIS — N3001 Acute cystitis with hematuria: Secondary | ICD-10-CM | POA: Diagnosis not present

## 2016-06-27 DIAGNOSIS — K219 Gastro-esophageal reflux disease without esophagitis: Secondary | ICD-10-CM | POA: Diagnosis not present

## 2016-06-27 DIAGNOSIS — S93402A Sprain of unspecified ligament of left ankle, initial encounter: Secondary | ICD-10-CM | POA: Diagnosis not present

## 2016-06-27 DIAGNOSIS — B373 Candidiasis of vulva and vagina: Secondary | ICD-10-CM | POA: Diagnosis not present

## 2016-06-27 DIAGNOSIS — B3731 Acute candidiasis of vulva and vagina: Secondary | ICD-10-CM

## 2016-06-27 LAB — POCT URINALYSIS DIPSTICK
Bilirubin, UA: NEGATIVE
Glucose, UA: 2000
Ketones, UA: NEGATIVE
Nitrite, UA: POSITIVE
Protein, UA: NEGATIVE
Spec Grav, UA: 1.02
Urobilinogen, UA: 0.2
pH, UA: 6

## 2016-06-27 MED ORDER — OMEPRAZOLE 40 MG PO CPDR: 40.0000 mg | DELAYED_RELEASE_CAPSULE | Freq: Every day | ORAL | 0 refills | Status: DC

## 2016-06-27 MED ORDER — SULFAMETHOXAZOLE-TRIMETHOPRIM 800-160 MG PO TABS: 1.0000 | ORAL_TABLET | Freq: Two times a day (BID) | ORAL | 0 refills | Status: DC

## 2016-06-27 MED ORDER — DOXYCYCLINE HYCLATE 100 MG PO TABS
100.0000 mg | ORAL_TABLET | Freq: Two times a day (BID) | ORAL | 0 refills | Status: AC
Start: 2016-06-27 — End: 2016-07-04

## 2016-06-27 MED ORDER — TERCONAZOLE 0.4 % VA CREA: 1.0000 | TOPICAL_CREAM | Freq: Every day | VAGINAL | 0 refills | Status: AC

## 2016-06-27 NOTE — Progress Notes (Signed)
Patient: Lisa [name] Female    DOB: Jul 02, 1960   56 y.o.   MRN: [id number] Visit Date: 06/27/2016  Today's Provider: Trinna Post, PA-C   Chief Complaint  Patient presents with  . Urinary Tract Infection   Subjective:    Urinary Tract Infection   The current episode started in the past 7 days. The problem has been gradually worsening. The quality of the pain is described as burning. Associated symptoms include a discharge, flank pain, frequency, nausea and urgency. Pertinent negatives include no hematuria. She has tried home medications and increased fluids for the symptoms. The treatment provided no relief.   Patient is a 56 y/o woman with history of uncontrolled Type II Dm, primary sclerosing cholangitis, and vulvovaginal candida presenting today c/o dysuria, frequency, suprapubic pain. Above pertinent positives and negatives listed. No fever, chills. She has been vomiting x 3 weeks with onset of new medication, did not start with urinary symptoms.  Patient also wanting refill of omeprazole for GERD and terazol for external genital itching 2/2 yeast. No vaginal discharge.     Allergies  Allergen Reactions  . Amoxicillin-Pot Clavulanate   . Ciprofloxacin   . Clarithromycin      Current Outpatient Prescriptions:  .  aspirin 81 MG tablet, Take 1 tablet by mouth daily., Disp: , Rfl:  .  atorvastatin (LIPITOR) 20 MG tablet, Take 1 tablet (20 mg total) by mouth daily., Disp: 90 tablet, Rfl: 3 .  Blood Glucose Monitoring Suppl (FIFTY50 GLUCOSE METER 2.0) W/DEVICE KIT, Use as directed., Disp: , Rfl:  .  glyBURIDE (DIABETA) 2.5 MG tablet, Take 1 tablet by mouth daily., Disp: , Rfl:  .  insulin aspart (NOVOLOG) 100 UNIT/ML injection, Take 12 units twice daily before meals, Disp: , Rfl:  .  insulin glargine (LANTUS) 100 UNIT/ML injection, Inject 50 Units into the skin daily., Disp: , Rfl:  .  Insulin Pen Needle (B-D UF III MINI PEN NEEDLES) 31G X 5 MM MISC, Use 1  needle daily with victoza injection, Disp: , Rfl:  .  lisinopril (PRINIVIL,ZESTRIL) 20 MG tablet, Take 1 tablet (20 mg total) by mouth daily., Disp: 90 tablet, Rfl: 3 .  metFORMIN (GLUMETZA) 1000 MG (MOD) 24 hr tablet, Take 1,000 mg by mouth daily with breakfast.  , Disp: , Rfl:  .  MULTIPLE VITAMINS-MINERALS PO, Take 1 tablet by mouth daily., Disp: , Rfl:  .  omeprazole (PRILOSEC) 40 MG capsule, Take 1 capsule by mouth  daily, Disp: 90 capsule, Rfl: 1 .  ursodiol (ACTIGALL) 300 MG capsule, Take 300 mg by mouth 2 (two) times daily., Disp: , Rfl:  .  ondansetron (ZOFRAN-ODT) 8 MG disintegrating tablet, Take 1 tablet (8 mg total) by mouth every 8 (eight) hours as needed for nausea or vomiting. (Patient not taking: Reported on 01/04/2016), Disp: 30 tablet, Rfl: 0  Review of Systems  Gastrointestinal: Positive for nausea.  Genitourinary: Positive for flank pain, frequency and urgency. Negative for hematuria.    Social History  Substance Use Topics  . Smoking status: Never Smoker  . Smokeless tobacco: Never Used  . Alcohol use No   Objective:   BP 126/68 (BP Location: Left Arm, Patient Position: Sitting, Cuff Size: Normal)   Pulse 72   Temp 98.9 F (37.2 C)   Resp 16   Wt 218 lb (98.9 kg)   SpO2 96%   BMI 33.15 kg/m   Physical Exam  Constitutional: She is oriented to person, place, and  time. She appears well-developed and well-nourished. No distress.  Cardiovascular: Normal rate and regular rhythm.   Pulmonary/Chest: Effort normal and breath sounds normal.  Abdominal: Soft. Bowel sounds are normal. She exhibits no distension. There is tenderness in the suprapubic area. There is no rebound, no guarding and no CVA tenderness.  Neurological: She is alert and oriented to person, place, and time.  Skin: Skin is warm and dry. She is not diaphoretic.  Psychiatric: She has a normal mood and affect. Her behavior is normal.        Assessment & Plan:     1. Acute cystitis with  hematuria  Treat as below 2/2 allergies and elevated liver enzymes. Will await cx.  - Urine culture - POCT urinalysis dipstick - doxycycline (VIBRA-TABS) 100 MG tablet; Take 1 tablet (100 mg total) by mouth 2 (two) times daily.  Dispense: 14 tablet; Refill: 0  2. Candida vaginitis  May use for external itching as well.  - terconazole (TERAZOL 7) 0.4 % vaginal cream; Place 1 applicator vaginally at bedtime.  Dispense: 45 g; Refill: 0  3. Gastroesophageal reflux disease, esophagitis presence not specified  Stable, pulled for refill.  - omeprazole (PRILOSEC) 40 MG capsule; Take 1 capsule (40 mg total) by mouth daily.  Dispense: 90 capsule; Refill: 0  Return if symptoms worsen or fail to improve.   Patient Instructions  Urinary Tract Infection, Adult A urinary tract infection (UTI) is an infection of any part of the urinary tract, which includes the kidneys, ureters, bladder, and urethra. These organs make, store, and get rid of urine in the body. UTI can be a bladder infection (cystitis) or kidney infection (pyelonephritis). What are the causes? This infection may be caused by fungi, viruses, or bacteria. Bacteria are the most common cause of UTIs. This condition can also be caused by repeated incomplete emptying of the bladder during urination. What increases the risk? This condition is more likely to develop if:  You ignore your need to urinate or hold urine for long periods of time.  You do not empty your bladder completely during urination.  You wipe back to front after urinating or having a bowel movement, if you are female.  You are uncircumcised, if you are female.  You are constipated.  You have a urinary catheter that stays in place (indwelling).  You have a weak defense (immune) system.  You have a medical condition that affects your bowels, kidneys, or bladder.  You have diabetes.  You take antibiotic medicines frequently or for long periods of time, and the  antibiotics no longer work well against certain types of infections (antibiotic resistance).  You take medicines that irritate your urinary tract.  You are exposed to chemicals that irritate your urinary tract.  You are female. What are the signs or symptoms? Symptoms of this condition include:  Fever.  Frequent urination or passing small amounts of urine frequently.  Needing to urinate urgently.  Pain or burning with urination.  Urine that smells bad or unusual.  Cloudy urine.  Pain in the lower abdomen or back.  Trouble urinating.  Blood in the urine.  Vomiting or being less hungry than normal.  Diarrhea or abdominal pain.  Vaginal discharge, if you are female. How is this diagnosed? This condition is diagnosed with a medical history and physical exam. You will also need to provide a urine sample to test your urine. Other tests may be done, including:  Blood tests.  Sexually transmitted disease (STD) testing. If  you have had more than one UTI, a cystoscopy or imaging studies may be done to determine the cause of the infections. How is this treated? Treatment for this condition often includes a combination of two or more of the following:  Antibiotic medicine.  Other medicines to treat less common causes of UTI.  Over-the-counter medicines to treat pain.  Drinking enough water to stay hydrated. Follow these instructions at home:  Take over-the-counter and prescription medicines only as told by your health care provider.  If you were prescribed an antibiotic, take it as told by your health care provider. Do not stop taking the antibiotic even if you start to feel better.  Avoid alcohol, caffeine, tea, and carbonated beverages. They can irritate your bladder.  Drink enough fluid to keep your urine clear or pale yellow.  Keep all follow-up visits as told by your health care provider. This is important.  Make sure to:  Empty your bladder often and  completely. Do not hold urine for long periods of time.  Empty your bladder before and after sex.  Wipe from front to back after a bowel movement if you are female. Use each tissue one time when you wipe. Contact a health care provider if:  You have back pain.  You have a fever.  You feel nauseous or vomit.  Your symptoms do not get better after 3 days.  Your symptoms go away and then return. Get help right away if:  You have severe back pain or lower abdominal pain.  You are vomiting and cannot keep down any medicines or water. This information is not intended to replace advice given to you by your health care provider. Make sure you discuss any questions you have with your health care provider. Document Released: 02/01/2005 Document Revised: 10/06/2015 Document Reviewed: 03/15/2015 Elsevier Interactive Patient Education  2017 [employer].   The entirety of the information documented in the History of Present Illness, Review of Systems and Physical Exam were personally obtained by me. Portions of this information were initially documented by [facility] and reviewed by me for thoroughness and accuracy.           Trinna Post, PA-C  [facility]

## 2016-06-27 NOTE — Patient Instructions (Signed)

## 2016-07-03 ENCOUNTER — Telehealth: Payer: Self-pay

## 2016-07-03 LAB — URINE CULTURE

## 2016-07-03 NOTE — Telephone Encounter (Signed)
-----   Message from Trinna Post, [place] sent at 07/03/2016  1:15 PM EST ----- Urine cx grew E. Coli sensitive to doxycyline, hopefully patient is feeling better. Please advise Thx.

## 2016-07-03 NOTE — Telephone Encounter (Signed)
lmtcb Lisa [name], CMA  

## 2016-07-05 DIAGNOSIS — S134XXA Sprain of ligaments of cervical spine, initial encounter: Secondary | ICD-10-CM | POA: Diagnosis not present

## 2016-07-05 NOTE — Telephone Encounter (Signed)
[email or web address] 

## 2016-07-11 NOTE — Telephone Encounter (Signed)
LMTCB 07/11/2016  Thanks,   -Mickel Baas

## 2016-07-11 NOTE — Telephone Encounter (Signed)
Patient advised as below. Pt reports her symptoms had cleared with abx. Patient reports she is having foul smelling urine and itching. Patient reports she will call back for an appointment later on this week.  sd

## 2016-07-31 DIAGNOSIS — R0602 Shortness of breath: Secondary | ICD-10-CM | POA: Diagnosis not present

## 2016-07-31 DIAGNOSIS — I208 Other forms of angina pectoris: Secondary | ICD-10-CM | POA: Diagnosis not present

## 2016-07-31 DIAGNOSIS — E1165 Type 2 diabetes mellitus with hyperglycemia: Secondary | ICD-10-CM | POA: Diagnosis not present

## 2016-07-31 DIAGNOSIS — I2510 Atherosclerotic heart disease of native coronary artery without angina pectoris: Secondary | ICD-10-CM | POA: Diagnosis not present

## 2016-07-31 DIAGNOSIS — Z794 Long term (current) use of insulin: Secondary | ICD-10-CM | POA: Diagnosis not present

## 2016-08-14 DIAGNOSIS — K743 Primary biliary cirrhosis: Secondary | ICD-10-CM | POA: Diagnosis not present

## 2016-08-14 DIAGNOSIS — K7581 Nonalcoholic steatohepatitis (NASH): Secondary | ICD-10-CM | POA: Diagnosis not present

## 2016-08-15 ENCOUNTER — Other Ambulatory Visit: Payer: Self-pay | Admitting: Physician Assistant

## 2016-08-15 DIAGNOSIS — K219 Gastro-esophageal reflux disease without esophagitis: Secondary | ICD-10-CM

## 2016-08-23 DIAGNOSIS — L2082 Flexural eczema: Secondary | ICD-10-CM | POA: Diagnosis not present

## 2016-08-23 DIAGNOSIS — H1013 Acute atopic conjunctivitis, bilateral: Secondary | ICD-10-CM | POA: Diagnosis not present

## 2016-08-23 DIAGNOSIS — J301 Allergic rhinitis due to pollen: Secondary | ICD-10-CM | POA: Diagnosis not present

## 2016-09-04 DIAGNOSIS — R0602 Shortness of breath: Secondary | ICD-10-CM | POA: Diagnosis not present

## 2016-09-04 DIAGNOSIS — I2510 Atherosclerotic heart disease of native coronary artery without angina pectoris: Secondary | ICD-10-CM | POA: Diagnosis not present

## 2016-09-04 DIAGNOSIS — I208 Other forms of angina pectoris: Secondary | ICD-10-CM | POA: Diagnosis not present

## 2016-09-11 DIAGNOSIS — I2510 Atherosclerotic heart disease of native coronary artery without angina pectoris: Secondary | ICD-10-CM | POA: Diagnosis not present

## 2016-09-11 DIAGNOSIS — I200 Unstable angina: Secondary | ICD-10-CM | POA: Diagnosis not present

## 2016-09-11 DIAGNOSIS — R9439 Abnormal result of other cardiovascular function study: Secondary | ICD-10-CM | POA: Diagnosis not present

## 2016-09-12 DIAGNOSIS — R9439 Abnormal result of other cardiovascular function study: Secondary | ICD-10-CM | POA: Diagnosis not present

## 2016-09-12 NOTE — OR Nursing (Signed)
Called and left message for [place] at [place] [facility] to let her know that orders are needed for patients procedure tomorrow

## 2016-09-13 ENCOUNTER — Observation Stay
Admission: RE | Admit: 2016-09-13 | Discharge: 2016-09-14 | Disposition: A | Discharge status: Home / Self Care | Payer: 59 | Source: Ambulatory Visit | Attending: Internal Medicine | Admitting: Internal Medicine

## 2016-09-13 ENCOUNTER — Encounter
Admission: RE | Disposition: A | Discharge status: Home / Self Care | Payer: Self-pay | Source: Ambulatory Visit | Attending: Internal Medicine

## 2016-09-13 DIAGNOSIS — I10 Essential (primary) hypertension: Secondary | ICD-10-CM | POA: Diagnosis not present

## 2016-09-13 DIAGNOSIS — Z794 Long term (current) use of insulin: Secondary | ICD-10-CM | POA: Insufficient documentation

## 2016-09-13 DIAGNOSIS — Z9851 Tubal ligation status: Secondary | ICD-10-CM | POA: Insufficient documentation

## 2016-09-13 DIAGNOSIS — Z8249 Family history of ischemic heart disease and other diseases of the circulatory system: Secondary | ICD-10-CM | POA: Insufficient documentation

## 2016-09-13 DIAGNOSIS — M1990 Unspecified osteoarthritis, unspecified site: Secondary | ICD-10-CM | POA: Insufficient documentation

## 2016-09-13 DIAGNOSIS — Z833 Family history of diabetes mellitus: Secondary | ICD-10-CM | POA: Diagnosis not present

## 2016-09-13 DIAGNOSIS — K219 Gastro-esophageal reflux disease without esophagitis: Secondary | ICD-10-CM | POA: Insufficient documentation

## 2016-09-13 DIAGNOSIS — I25110 Atherosclerotic heart disease of native coronary artery with unstable angina pectoris: Principal | ICD-10-CM | POA: Insufficient documentation

## 2016-09-13 DIAGNOSIS — E119 Type 2 diabetes mellitus without complications: Secondary | ICD-10-CM | POA: Insufficient documentation

## 2016-09-13 DIAGNOSIS — Z880 Allergy status to penicillin: Secondary | ICD-10-CM | POA: Diagnosis not present

## 2016-09-13 DIAGNOSIS — I200 Unstable angina: Secondary | ICD-10-CM | POA: Diagnosis not present

## 2016-09-13 DIAGNOSIS — Z955 Presence of coronary angioplasty implant and graft: Secondary | ICD-10-CM | POA: Diagnosis present

## 2016-09-13 DIAGNOSIS — Z7982 Long term (current) use of aspirin: Secondary | ICD-10-CM | POA: Diagnosis not present

## 2016-09-13 HISTORY — PX: CORONARY STENT INTERVENTION: CATH118234

## 2016-09-13 HISTORY — PX: LEFT HEART CATH AND CORONARY ANGIOGRAPHY: CATH118249

## 2016-09-13 LAB — GLUCOSE, CAPILLARY
Glucose-Capillary: 121 mg/dL — ABNORMAL HIGH (ref 65–99)
Glucose-Capillary: 228 mg/dL — ABNORMAL HIGH (ref 65–99)

## 2016-09-13 LAB — POCT ACTIVATED CLOTTING TIME: Activated Clotting Time: 296 seconds

## 2016-09-13 SURGERY — LEFT HEART CATH AND CORONARY ANGIOGRAPHY
Anesthesia: Moderate Sedation

## 2016-09-13 MED ORDER — ACETAMINOPHEN 325 MG PO TABS: 650.0000 mg | ORAL_TABLET | ORAL | Status: DC | PRN

## 2016-09-13 MED ORDER — LABETALOL HCL 5 MG/ML IV SOLN: 10.0000 mg | INTRAVENOUS | Status: AC | PRN

## 2016-09-13 MED ORDER — SODIUM CHLORIDE 0.9% FLUSH: 3.0000 mL | INTRAVENOUS | Status: DC | PRN

## 2016-09-13 MED ORDER — MIDAZOLAM HCL 2 MG/2ML IJ SOLN
INTRAMUSCULAR | Status: AC
  Filled 2016-09-13: qty 2

## 2016-09-13 MED ORDER — HYDRALAZINE HCL 20 MG/ML IJ SOLN: 5.0000 mg | INTRAMUSCULAR | Status: AC | PRN

## 2016-09-13 MED ORDER — BIVALIRUDIN TRIFLUOROACETATE 250 MG IV SOLR
0.2500 mg/kg/h | INTRAVENOUS | Status: AC
  Filled 2016-09-13: qty 250

## 2016-09-13 MED ORDER — SODIUM CHLORIDE 0.9 % WEIGHT BASED INFUSION: 1.0000 mL/kg/h | INTRAVENOUS | Status: DC

## 2016-09-13 MED ORDER — FENTANYL CITRATE (PF) 100 MCG/2ML IJ SOLN
INTRAMUSCULAR | Status: DC | PRN
  Administered 2016-09-13: 25 ug via INTRAVENOUS

## 2016-09-13 MED ORDER — SODIUM CHLORIDE 0.9 % IV SOLN: 250.0000 mL | INTRAVENOUS | Status: DC | PRN

## 2016-09-13 MED ORDER — ISOSORBIDE MONONITRATE ER 60 MG PO TB24
60.0000 mg | ORAL_TABLET | Freq: Every day | ORAL | Status: DC
  Administered 2016-09-13 – 2016-09-14 (×2): 60 mg via ORAL
  Filled 2016-09-13 (×2): qty 1

## 2016-09-13 MED ORDER — ACETAMINOPHEN 325 MG PO TABS
ORAL_TABLET | ORAL | Status: AC
  Administered 2016-09-13: 650 mg via ORAL
  Filled 2016-09-13: qty 2

## 2016-09-13 MED ORDER — ONDANSETRON HCL 4 MG/2ML IJ SOLN
4.0000 mg | Freq: Four times a day (QID) | INTRAMUSCULAR | Status: DC | PRN
  Administered 2016-09-13: 4 mg via INTRAVENOUS
  Filled 2016-09-13: qty 2

## 2016-09-13 MED ORDER — SODIUM CHLORIDE 0.9 % WEIGHT BASED INFUSION
3.0000 mL/kg/h | INTRAVENOUS | Status: DC
  Administered 2016-09-13: 3 mL/kg/h via INTRAVENOUS

## 2016-09-13 MED ORDER — ASPIRIN 81 MG PO CHEW
81.0000 mg | CHEWABLE_TABLET | Freq: Every day | ORAL | Status: DC
  Administered 2016-09-13 – 2016-09-14 (×2): 81 mg via ORAL
  Filled 2016-09-13 (×2): qty 1

## 2016-09-13 MED ORDER — FENTANYL CITRATE (PF) 100 MCG/2ML IJ SOLN
INTRAMUSCULAR | Status: AC
  Filled 2016-09-13: qty 2

## 2016-09-13 MED ORDER — NITROGLYCERIN 5 MG/ML IV SOLN
INTRAVENOUS | Status: AC
  Filled 2016-09-13: qty 10

## 2016-09-13 MED ORDER — TICAGRELOR 90 MG PO TABS
90.0000 mg | ORAL_TABLET | Freq: Two times a day (BID) | ORAL | Status: DC
  Administered 2016-09-14 (×2): 90 mg via ORAL
  Filled 2016-09-13 (×2): qty 1

## 2016-09-13 MED ORDER — ASPIRIN 81 MG PO CHEW
CHEWABLE_TABLET | ORAL | Status: DC | PRN
  Administered 2016-09-13: 243 mg via ORAL

## 2016-09-13 MED ORDER — TICAGRELOR 90 MG PO TABS
ORAL_TABLET | ORAL | Status: DC | PRN
  Administered 2016-09-13: 180 mg via ORAL

## 2016-09-13 MED ORDER — PANTOPRAZOLE SODIUM 40 MG PO TBEC
40.0000 mg | DELAYED_RELEASE_TABLET | Freq: Every day | ORAL | Status: DC
  Administered 2016-09-13 – 2016-09-14 (×2): 40 mg via ORAL
  Filled 2016-09-13 (×2): qty 1

## 2016-09-13 MED ORDER — HEPARIN (PORCINE) IN NACL 2-0.9 UNIT/ML-% IJ SOLN
INTRAMUSCULAR | Status: AC
  Filled 2016-09-13: qty 500

## 2016-09-13 MED ORDER — INSULIN GLARGINE 100 UNIT/ML ~~LOC~~ SOLN
50.0000 [IU] | Freq: Every day | SUBCUTANEOUS | Status: DC
  Administered 2016-09-13: 50 [IU] via SUBCUTANEOUS
  Filled 2016-09-13 (×2): qty 0.5

## 2016-09-13 MED ORDER — METOPROLOL SUCCINATE ER 50 MG PO TB24
25.0000 mg | ORAL_TABLET | Freq: Every day | ORAL | Status: DC
  Administered 2016-09-13 – 2016-09-14 (×2): 25 mg via ORAL
  Filled 2016-09-13 (×2): qty 1

## 2016-09-13 MED ORDER — SODIUM CHLORIDE 0.9 % WEIGHT BASED INFUSION: 1.0000 mL/kg/h | INTRAVENOUS | Status: AC

## 2016-09-13 MED ORDER — GLYBURIDE 2.5 MG PO TABS
2.5000 mg | ORAL_TABLET | Freq: Every day | ORAL | Status: DC
  Administered 2016-09-14: 2.5 mg via ORAL
  Filled 2016-09-13: qty 1

## 2016-09-13 MED ORDER — ATORVASTATIN CALCIUM 20 MG PO TABS
80.0000 mg | ORAL_TABLET | Freq: Every day | ORAL | Status: DC
  Administered 2016-09-13: 80 mg via ORAL
  Filled 2016-09-13: qty 4

## 2016-09-13 MED ORDER — ACETAMINOPHEN 325 MG PO TABS
650.0000 mg | ORAL_TABLET | ORAL | Status: DC | PRN
  Administered 2016-09-13: 650 mg via ORAL

## 2016-09-13 MED ORDER — BIVALIRUDIN BOLUS VIA INFUSION - CUPID
INTRAVENOUS | Status: DC | PRN
  Administered 2016-09-13: 71.775 mg via INTRAVENOUS

## 2016-09-13 MED ORDER — TICAGRELOR 90 MG PO TABS
ORAL_TABLET | ORAL | Status: AC
  Filled 2016-09-13: qty 2

## 2016-09-13 MED ORDER — URSODIOL 300 MG PO CAPS
300.0000 mg | ORAL_CAPSULE | Freq: Three times a day (TID) | ORAL | Status: DC
  Administered 2016-09-13 – 2016-09-14 (×2): 300 mg via ORAL
  Filled 2016-09-13 (×2): qty 1

## 2016-09-13 MED ORDER — ASPIRIN 81 MG PO CHEW
CHEWABLE_TABLET | ORAL | Status: AC
  Filled 2016-09-13: qty 3

## 2016-09-13 MED ORDER — IOPAMIDOL (ISOVUE-300) INJECTION 61%
INTRAVENOUS | Status: DC | PRN
  Administered 2016-09-13: 265 mL via INTRA_ARTERIAL

## 2016-09-13 MED ORDER — BIVALIRUDIN TRIFLUOROACETATE 250 MG IV SOLR
INTRAVENOUS | Status: DC | PRN
  Administered 2016-09-13: 1.75 mg/kg/h via INTRAVENOUS

## 2016-09-13 MED ORDER — SODIUM CHLORIDE 0.9% FLUSH: 3.0000 mL | Freq: Two times a day (BID) | INTRAVENOUS | Status: DC

## 2016-09-13 MED ORDER — SODIUM CHLORIDE 0.9% FLUSH
3.0000 mL | Freq: Two times a day (BID) | INTRAVENOUS | Status: DC
  Administered 2016-09-14 (×2): 3 mL via INTRAVENOUS

## 2016-09-13 MED ORDER — LISINOPRIL 10 MG PO TABS
20.0000 mg | ORAL_TABLET | Freq: Every day | ORAL | Status: DC
  Administered 2016-09-14: 20 mg via ORAL
  Filled 2016-09-13: qty 2

## 2016-09-13 MED ORDER — INSULIN ASPART 100 UNIT/ML ~~LOC~~ SOLN
15.0000 [IU] | Freq: Three times a day (TID) | SUBCUTANEOUS | Status: DC
  Administered 2016-09-14: 15 [IU] via SUBCUTANEOUS
  Filled 2016-09-13: qty 15

## 2016-09-13 MED ORDER — BIVALIRUDIN TRIFLUOROACETATE 250 MG IV SOLR
INTRAVENOUS | Status: AC
  Filled 2016-09-13: qty 250

## 2016-09-13 MED ORDER — ASPIRIN 81 MG PO CHEW: 81.0000 mg | CHEWABLE_TABLET | ORAL | Status: DC

## 2016-09-13 MED ORDER — MIDAZOLAM HCL 2 MG/2ML IJ SOLN
INTRAMUSCULAR | Status: DC | PRN
  Administered 2016-09-13: 1 mg via INTRAVENOUS

## 2016-09-13 SURGICAL SUPPLY — 16 items
BALLN TREK RX 2.5X15 (BALLOONS) ×3
BALLOON TREK RX 2.5X15 (BALLOONS) ×1 IMPLANT
CATH 5FR JL4 DIAGNOSTIC (CATHETERS) ×3 IMPLANT
CATH INFINITI 5FR ANG PIGTAIL (CATHETERS) ×3 IMPLANT
CATH INFINITI JR4 5F (CATHETERS) ×3 IMPLANT
CATH VISTA GUIDE 6FR MPA1 (CATHETERS) ×3 IMPLANT
DEVICE INFLAT 30 PLUS (MISCELLANEOUS) ×3 IMPLANT
GUIDEWIRE 3MM J TIP .035 145 (WIRE) ×3 IMPLANT
KIT MANI 3VAL PERCEP (MISCELLANEOUS) ×3 IMPLANT
NEEDLE PERC 18GX7CM (NEEDLE) ×3 IMPLANT
PACK CARDIAC CATH (CUSTOM PROCEDURE TRAY) ×3 IMPLANT
SHEATH AVANTI 5FR X 11CM (SHEATH) ×3 IMPLANT
SHEATH AVANTI 6FR X 11CM (SHEATH) ×3 IMPLANT
STENT XIENCE ALPINE RX 2.75X38 (Permanent Stent) ×3 IMPLANT
SUT SILK 0 FSL (SUTURE) ×3 IMPLANT
WIRE G HI TQ BMW 190 (WIRE) ×3 IMPLANT

## 2016-09-13 NOTE — Progress Notes (Signed)
Report called to Tammy on 2A. She is aware that patient will be coming to floor around change of shift.

## 2016-09-14 ENCOUNTER — Encounter: Payer: Self-pay | Admitting: Internal Medicine

## 2016-09-14 DIAGNOSIS — I25110 Atherosclerotic heart disease of native coronary artery with unstable angina pectoris: Secondary | ICD-10-CM | POA: Diagnosis not present

## 2016-09-14 DIAGNOSIS — Z136 Encounter for screening for cardiovascular disorders: Secondary | ICD-10-CM | POA: Diagnosis not present

## 2016-09-14 LAB — CARDIAC CATHETERIZATION: Cath EF Quantitative: 55 %

## 2016-09-14 LAB — GLUCOSE, CAPILLARY
Glucose-Capillary: 158 mg/dL — ABNORMAL HIGH (ref 65–99)
Glucose-Capillary: 181 mg/dL — ABNORMAL HIGH (ref 65–99)

## 2016-09-14 MED ORDER — INSULIN ASPART 100 UNIT/ML ~~LOC~~ SOLN: 15.0000 [IU] | Freq: Three times a day (TID) | SUBCUTANEOUS | 11 refills | Status: DC

## 2016-09-14 MED ORDER — METOPROLOL SUCCINATE ER 25 MG PO TB24: 25.0000 mg | ORAL_TABLET | Freq: Every day | ORAL | 6 refills | Status: DC

## 2016-09-14 MED ORDER — ISOSORBIDE MONONITRATE ER 60 MG PO TB24: 60.0000 mg | ORAL_TABLET | Freq: Every day | ORAL | 6 refills | Status: DC

## 2016-09-14 MED ORDER — ATORVASTATIN CALCIUM 80 MG PO TABS: 80.0000 mg | ORAL_TABLET | Freq: Every day | ORAL | 12 refills | Status: DC

## 2016-09-14 MED ORDER — TICAGRELOR 90 MG PO TABS: 90.0000 mg | ORAL_TABLET | Freq: Two times a day (BID) | ORAL | 12 refills | Status: AC

## 2016-09-14 NOTE — Progress Notes (Signed)
Patient is discharge home in a stable condition summary and f/u care given , right groin site good , site care given , verbalized understanding

## 2016-09-14 NOTE — Discharge Summary (Signed)
Physician Discharge Summary  Patient ID: Lisa [name] MRN: [id number] DOB/AGE: 05-16-1960 56 y.o.  Admit date: 09/13/2016 Discharge date: 09/14/2016  Admission Diagnoses:Unstable angina abnormal Myoview  Discharge Diagnoses:  Active Problems:   S/P drug eluting coronary stent placement   Discharged Condition: stable  Hospital Course: Patient was admitted as an outpatient for diagnostic cardiac catheter because of unstable angina patient underwent diagnostic cardiac catheter on May 9 which showed high-grade lesion in the right coronary artery which was intervened on with the DES stent. Patient had residual disease in circumflex which is being treated medically because of  CTO and diffusely diseased left ventricular function was preserved with percent. Patient tolerated procedure well was placed on Brilinta aspirin and Lipitor 80 mg  Consults: None  Significant Diagnostic Studies: angiography: Cardiac catheterization with multivessel coronary disease and subsequent intervention to RCA  Treatments: IV hydration and PCI and stent intervention to the RCA with DES  Discharge Exam: Blood pressure (!) 112/58, pulse 64, temperature 97.9 F (36.6 C), temperature source Oral, resp. rate 16, height 5\' 8"  (1.727 m), weight 103.5 kg (228 lb 3.2 oz), SpO2 99 %. General appearance: appears stated age Cardio: regular rate and rhythm, S1, S2 normal, no murmur, click, rub or gallop Extremities: extremities normal, atraumatic, no cyanosis or edema Pulses: 2+ and symmetric Skin: Skin color, texture, turgor normal. No rashes or lesions  Disposition:  patient is being discharged home under the care of her family she is asked not to do any heavy lifting for 2 weeks and return to work on Monday, May 14 follow-up with cardiology one to 2 weeks. Patient allowed to resume her metformin immediately  Discharge Instructions    AMB Referral to Cardiac Rehabilitation - Phase II    Complete by:  As  directed    Diagnosis:  Coronary Stents     Allergies as of 09/14/2016      Reactions   Amoxicillin-pot Clavulanate    Ciprofloxacin    Clarithromycin       Medication List    STOP taking these medications   ondansetron 8 MG disintegrating tablet Commonly known as:  ZOFRAN-ODT     TAKE these medications   aspirin 81 MG tablet Take 1 tablet by mouth daily.   atorvastatin 80 MG tablet Commonly known as:  LIPITOR Take 1 tablet (80 mg total) by mouth daily at 6 PM. What changed:  medication strength  how much to take  when to take this   B-D UF III MINI PEN NEEDLES 31G X 5 MM Misc Generic drug:  Insulin Pen Needle Use 1 needle daily with victoza injection   FIFTY50 GLUCOSE METER 2.0 w/Device Kit Use as directed.   glyBURIDE 2.5 MG tablet Commonly known as:  DIABETA Take 1 tablet by mouth daily.   insulin lispro 100 UNIT/ML injection Commonly known as:  HUMALOG Inject 15 Units into the skin 3 (three) times daily before meals.   isosorbide mononitrate 60 MG 24 hr tablet Commonly known as:  IMDUR Take 1 tablet (60 mg total) by mouth daily. Start taking on:  09/15/2016   LANTUS 100 UNIT/ML injection Generic drug:  insulin glargine Inject 50 Units into the skin daily.   lisinopril 20 MG tablet Commonly known as:  PRINIVIL,ZESTRIL Take 1 tablet (20 mg total) by mouth daily.   metFORMIN 1000 MG (MOD) 24 hr tablet Commonly known as:  GLUMETZA Take 1,000 mg by mouth daily with breakfast.   metoprolol succinate 25 MG 24 hr tablet Commonly  known as:  TOPROL-XL Take 1 tablet (25 mg total) by mouth daily. Take with or immediately following a meal. Start taking on:  09/15/2016   MULTIPLE VITAMINS-MINERALS PO Take 1 tablet by mouth daily.   NOVOLOG 100 UNIT/ML injection Generic drug:  insulin aspart Take 12 units twice daily before meals What changed:  Another medication with the same name was added. Make sure you understand how and when to take each.    insulin aspart 100 UNIT/ML injection Commonly known as:  novoLOG Inject 15 Units into the skin 3 (three) times daily before meals. What changed:  You were already taking a medication with the same name, and this prescription was added. Make sure you understand how and when to take each.   omeprazole 40 MG capsule Commonly known as:  PRILOSEC TAKE 1 CAPSULE BY MOUTH  DAILY   ticagrelor 90 MG Tabs tablet Commonly known as:  BRILINTA Take 1 tablet (90 mg total) by mouth 2 (two) times daily.   ursodiol 300 MG capsule Commonly known as:  ACTIGALL Take 300 mg by mouth 2 (two) times daily.        SignedYolonda Kida 09/14/2016, 8:52 AM

## 2016-09-14 NOTE — Care Management (Signed)
Patient to discharge on Brilinta. Verbally confirmed pharmacy coverage with insurance.  Provided with coupon

## 2016-09-14 NOTE — Progress Notes (Signed)
Inpatient Diabetes Program Recommendations  AACE/ADA: New Consensus Statement on Inpatient Glycemic Control (2015)  Target Ranges:  Prepandial:   less than 140 mg/dL      Peak postprandial:   less than 180 mg/dL (1-2 hours)      Critically ill patients:  140 - 180 mg/dL   Results for Lisa [name], Lisa [name] (MRN [id number]) as of 09/14/2016 08:43  Ref. Range 09/13/2016 12:02 09/13/2016 19:24 09/14/2016 08:21  Glucose-Capillary Latest Ref Range: 65 - 99 mg/dL 121 (H) 228 (H) 158 (H)  Review of Glycemic Control  Diabetes history: DM2 Outpatient Diabetes medications: Lantus 50 units QHS, Humalog 15 units TID with meals, Glipizide 2.5 mg QAM, Glumetza 1000 mg QAM Current orders for Inpatient glycemic control: Lantus 50 units QHS, Novolog 15 units TID with meals, Glipizide 2.5 mg QAM,   Inpatient Diabetes Program Recommendations: Correction (SSI): While inpatient, please consider using Glycemic Control order set and ordering CBGs and Novolog 0-9 units TID with meals and Novolog 0-5 units QHS. Oral Agents: While inpatinet, please consider discontinuing Glipizide 2.5 mg QAM.  Thanks, Barnie Alderman, RN, MSN, CDE Diabetes Coordinator Inpatient Diabetes Program [phone] (Team Pager from 8am to Ray)

## 2016-09-14 NOTE — Final Progress Note (Signed)
 Physician Final Progress Note  Patient ID: Lisa [name] MRN: [id number] DOB/AGE: [birth year] 55 y.o.  Admit date: 09/13/2016 Admitting provider: Yolonda Kida, MD Discharge date: 09/14/2016   Admission Diagnoses: Unstable angina  Discharge Diagnoses:  Active Problems:   S/P drug eluting coronary stent placement Present stent DES to RCA  Consults: None  Significant Findings/ Diagnostic Studies: angiography: Cardiac catheter PCI and stent  Procedures: Cardiac catheter PCI and stent DES to RCA  Discharge Condition: stable  Disposition:   Diet: Diabetic diet  Discharge Activity: No heavy lifting, pushing, pulling with the implant side for 2 months  Discharge Instructions    AMB Referral to Cardiac Rehabilitation - Phase II    Complete by:  As directed    Diagnosis:  Coronary Stents     Allergies as of 09/14/2016      Reactions   Amoxicillin-pot Clavulanate    Ciprofloxacin    Clarithromycin       Medication List    STOP taking these medications   ondansetron 8 MG disintegrating tablet Commonly known as:  ZOFRAN-ODT     TAKE these medications   aspirin 81 MG tablet Take 1 tablet by mouth daily.   atorvastatin 80 MG tablet Commonly known as:  LIPITOR Take 1 tablet (80 mg total) by mouth daily at 6 PM. What changed:  medication strength  how much to take  when to take this   B-D UF III MINI PEN NEEDLES 31G X 5 MM Misc Generic drug:  Insulin Pen Needle Use 1 needle daily with victoza injection   FIFTY50 GLUCOSE METER 2.0 w/Device Kit Use as directed.   glyBURIDE 2.5 MG tablet Commonly known as:  DIABETA Take 1 tablet by mouth daily.   insulin lispro 100 UNIT/ML injection Commonly known as:  HUMALOG Inject 15 Units into the skin 3 (three) times daily before meals.   isosorbide mononitrate 60 MG 24 hr tablet Commonly known as:  IMDUR Take 1 tablet (60 mg total) by mouth daily. Start taking on:  09/15/2016   LANTUS 100 UNIT/ML  injection Generic drug:  insulin glargine Inject 50 Units into the skin daily.   lisinopril 20 MG tablet Commonly known as:  PRINIVIL,ZESTRIL Take 1 tablet (20 mg total) by mouth daily.   metFORMIN 1000 MG (MOD) 24 hr tablet Commonly known as:  GLUMETZA Take 1,000 mg by mouth daily with breakfast.   metoprolol succinate 25 MG 24 hr tablet Commonly known as:  TOPROL-XL Take 1 tablet (25 mg total) by mouth daily. Take with or immediately following a meal. Start taking on:  09/15/2016   MULTIPLE VITAMINS-MINERALS PO Take 1 tablet by mouth daily.   NOVOLOG 100 UNIT/ML injection Generic drug:  insulin aspart Take 12 units twice daily before meals What changed:  Another medication with the same name was added. Make sure you understand how and when to take each.   insulin aspart 100 UNIT/ML injection Commonly known as:  novoLOG Inject 15 Units into the skin 3 (three) times daily before meals. What changed:  You were already taking a medication with the same name, and this prescription was added. Make sure you understand how and when to take each.   omeprazole 40 MG capsule Commonly known as:  PRILOSEC TAKE 1 CAPSULE BY MOUTH  DAILY   ticagrelor 90 MG Tabs tablet Commonly known as:  BRILINTA Take 1 tablet (90 mg total) by mouth 2 (two) times daily.   ursodiol 300 MG capsule Commonly known as:  ACTIGALL  Take 300 mg by mouth 2 (two) times daily.        Total time spent taking care of this patient: 30 minutes  Signed: Loran Senters  09/14/2016, 8:56 AM

## 2016-09-14 NOTE — Discharge Instructions (Signed)
Rest leg for at least 1 week resume work on Monday, May 14th. No heavy lifting for at least 2 weeks Resume driving on Monday, May 14

## 2016-10-04 DIAGNOSIS — I2510 Atherosclerotic heart disease of native coronary artery without angina pectoris: Secondary | ICD-10-CM | POA: Diagnosis not present

## 2016-10-04 DIAGNOSIS — I200 Unstable angina: Secondary | ICD-10-CM | POA: Diagnosis not present

## 2016-10-04 DIAGNOSIS — I10 Essential (primary) hypertension: Secondary | ICD-10-CM | POA: Diagnosis not present

## 2016-10-09 ENCOUNTER — Encounter: Payer: 59 | Attending: Internal Medicine

## 2016-10-20 ENCOUNTER — Telehealth: Payer: Self-pay | Admitting: Physician Assistant

## 2016-10-20 DIAGNOSIS — K219 Gastro-esophageal reflux disease without esophagitis: Secondary | ICD-10-CM

## 2016-10-20 MED ORDER — PANTOPRAZOLE SODIUM 40 MG PO TBEC: 40.0000 mg | DELAYED_RELEASE_TABLET | Freq: Every day | ORAL | 1 refills | Status: DC

## 2016-10-20 NOTE — Telephone Encounter (Signed)
Received notice from [employer] of drug interaction between omeprazole and clopidogrel. Will change omeprazole to pantoprazole 40mg . This will be sent electronically.

## 2016-12-28 ENCOUNTER — Encounter: Payer: Self-pay | Admitting: Physician Assistant

## 2016-12-28 DIAGNOSIS — E119 Type 2 diabetes mellitus without complications: Secondary | ICD-10-CM | POA: Diagnosis not present

## 2016-12-28 LAB — HM DIABETES EYE EXAM

## 2017-01-01 ENCOUNTER — Ambulatory Visit: Payer: 59 | Admitting: Family Medicine

## 2017-01-05 ENCOUNTER — Encounter: Payer: Self-pay | Admitting: Physician Assistant

## 2017-01-22 DIAGNOSIS — E1165 Type 2 diabetes mellitus with hyperglycemia: Secondary | ICD-10-CM | POA: Diagnosis not present

## 2017-01-22 DIAGNOSIS — E1159 Type 2 diabetes mellitus with other circulatory complications: Secondary | ICD-10-CM | POA: Diagnosis not present

## 2017-01-22 DIAGNOSIS — E1169 Type 2 diabetes mellitus with other specified complication: Secondary | ICD-10-CM | POA: Diagnosis not present

## 2017-01-26 DIAGNOSIS — E119 Type 2 diabetes mellitus without complications: Secondary | ICD-10-CM | POA: Diagnosis not present

## 2017-01-26 DIAGNOSIS — Z794 Long term (current) use of insulin: Secondary | ICD-10-CM | POA: Diagnosis not present

## 2017-02-26 ENCOUNTER — Encounter: Payer: Self-pay | Admitting: Emergency Medicine

## 2017-02-26 ENCOUNTER — Emergency Department
Admission: EM | Admit: 2017-02-26 | Discharge: 2017-02-26 | Disposition: A | Discharge status: Home / Self Care | Payer: 59 | Attending: Emergency Medicine | Admitting: Emergency Medicine

## 2017-02-26 DIAGNOSIS — E119 Type 2 diabetes mellitus without complications: Secondary | ICD-10-CM | POA: Insufficient documentation

## 2017-02-26 DIAGNOSIS — N3000 Acute cystitis without hematuria: Secondary | ICD-10-CM | POA: Insufficient documentation

## 2017-02-26 DIAGNOSIS — I10 Essential (primary) hypertension: Secondary | ICD-10-CM | POA: Diagnosis not present

## 2017-02-26 DIAGNOSIS — Z794 Long term (current) use of insulin: Secondary | ICD-10-CM | POA: Insufficient documentation

## 2017-02-26 DIAGNOSIS — Z79899 Other long term (current) drug therapy: Secondary | ICD-10-CM | POA: Insufficient documentation

## 2017-02-26 DIAGNOSIS — R509 Fever, unspecified: Secondary | ICD-10-CM | POA: Diagnosis present

## 2017-02-26 DIAGNOSIS — Z7982 Long term (current) use of aspirin: Secondary | ICD-10-CM | POA: Diagnosis not present

## 2017-02-26 LAB — URINALYSIS, COMPLETE (UACMP) WITH MICROSCOPIC
Bilirubin Urine: NEGATIVE
Glucose, UA: >=500 mg/dL — AB
Hgb urine dipstick: NEGATIVE
Ketones, ur: NEGATIVE mg/dL
Nitrite: NEGATIVE
Protein, ur: NEGATIVE mg/dL
Specific Gravity, Urine: 1.012 (ref 1.005–1.030)
pH: 7 (ref 5.0–8.0)

## 2017-02-26 LAB — COMPREHENSIVE METABOLIC PANEL
ALT: 134 U/L — ABNORMAL HIGH (ref 14–54)
AST: 104 U/L — ABNORMAL HIGH (ref 15–41)
Albumin: 3.3 g/dL — ABNORMAL LOW (ref 3.5–5.0)
Alkaline Phosphatase: 430 U/L — ABNORMAL HIGH (ref 38–126)
Anion gap: 10 (ref 5–15)
BUN: 10 mg/dL (ref 6–20)
CO2: 26 mmol/L (ref 22–32)
Calcium: 9 mg/dL (ref 8.9–10.3)
Chloride: 100 mmol/L — ABNORMAL LOW (ref 101–111)
Creatinine, Ser: 0.65 mg/dL (ref 0.44–1.00)
GFR calc Af Amer: >60 mL/min (ref >60)
GFR calc non Af Amer: >60 mL/min (ref >60)
Glucose, Bld: 196 mg/dL — ABNORMAL HIGH (ref 65–99)
Potassium: 3.3 mmol/L — ABNORMAL LOW (ref 3.5–5.1)
Sodium: 136 mmol/L (ref 135–145)
Total Bilirubin: 0.8 mg/dL (ref 0.3–1.2)
Total Protein: 8.5 g/dL — ABNORMAL HIGH (ref 6.5–8.1)

## 2017-02-26 LAB — CBC
HCT: 38 % (ref 35.0–47.0)
Hemoglobin: 12.3 g/dL (ref 12.0–16.0)
MCH: 26.1 pg (ref 26.0–34.0)
MCHC: 32.5 g/dL (ref 32.0–36.0)
MCV: 80.3 fL (ref 80.0–100.0)
Platelets: 154 10*3/uL (ref 150–440)
RBC: 4.73 MIL/uL (ref 3.80–5.20)
RDW: 14 % (ref 11.5–14.5)
WBC: 8.1 10*3/uL (ref 3.6–11.0)

## 2017-02-26 LAB — LIPASE, BLOOD: Lipase: 30 U/L (ref 11–51)

## 2017-02-26 MED ORDER — CEFTRIAXONE SODIUM IN DEXTROSE 20 MG/ML IV SOLN
1.0000 g | Freq: Once | INTRAVENOUS | Status: AC
  Administered 2017-02-26: 1 g via INTRAVENOUS
  Filled 2017-02-26: qty 50

## 2017-02-26 MED ORDER — ONDANSETRON HCL 4 MG PO TABS: 4.0000 mg | ORAL_TABLET | Freq: Three times a day (TID) | ORAL | 0 refills | Status: AC | PRN

## 2017-02-26 MED ORDER — CEPHALEXIN 500 MG PO CAPS: 500.0000 mg | ORAL_CAPSULE | Freq: Three times a day (TID) | ORAL | 0 refills | Status: DC

## 2017-02-26 MED ORDER — SODIUM CHLORIDE 0.9 % IV BOLUS (SEPSIS)
1000.0000 mL | Freq: Once | INTRAVENOUS | Status: AC
  Administered 2017-02-26: 1000 mL via INTRAVENOUS

## 2017-02-26 MED ORDER — ACETAMINOPHEN 325 MG PO TABS
ORAL_TABLET | ORAL | Status: AC
  Filled 2017-02-26: qty 2

## 2017-02-26 MED ORDER — ACETAMINOPHEN 325 MG PO TABS
650.0000 mg | ORAL_TABLET | Freq: Once | ORAL | Status: AC | PRN
  Administered 2017-02-26: 650 mg via ORAL
  Filled 2017-02-26: qty 2

## 2017-02-26 MED ORDER — ONDANSETRON 4 MG PO TBDP
4.0000 mg | ORAL_TABLET | Freq: Once | ORAL | Status: AC | PRN
  Administered 2017-02-26: 4 mg via ORAL
  Filled 2017-02-26: qty 1

## 2017-02-26 MED ORDER — METOCLOPRAMIDE HCL 5 MG/ML IJ SOLN
10.0000 mg | Freq: Once | INTRAMUSCULAR | Status: AC
  Administered 2017-02-26: 10 mg via INTRAVENOUS
  Filled 2017-02-26: qty 2

## 2017-02-26 MED ORDER — ACETAMINOPHEN 325 MG PO TABS
650.0000 mg | ORAL_TABLET | Freq: Once | ORAL | Status: AC
  Administered 2017-02-26: 650 mg via ORAL

## 2017-02-26 NOTE — Discharge Instructions (Signed)
Return to the ER for new or worsening abdominal pain, vomiting, fevers, weakness, back pain, or any other new or worsening symptoms that concern you.  Follow up with your primary care.  Take the antibiotic as prescribed and finish the full course.

## 2017-02-26 NOTE — ED Provider Notes (Signed)
[facility] Emergency Department Provider Note ____________________________________________   First MD Initiated Contact with Patient 02/26/17 1715     (approximate)  I have reviewed the triage vital signs and the nursing notes.   HISTORY  Chief Complaint Fever and Emesis    HPI Lisa [name] is a 56 y.o. female  with past medical history as noted below, including hypertension and diabetes, who presents with fever for the last 2 days, subjective, persistent course, associated with nausea and vomiting since this morning, as well as some urinary frequency. Patient reports suprapubic abdominal pain that started after the vomiting. She denies flank pain. She denies diarrhea. No cough or difficulty breathing.   Past Medical History:  Diagnosis Date  . Arthritis   . Cirrhosis ([place])   . Diabetes mellitus   . GERD (gastroesophageal reflux disease)   . Hypertension     Patient Active Problem List   Diagnosis Date Noted  . S/P drug eluting coronary stent placement 09/13/2016  . Elevated liver enzymes 09/23/2015  . Diabetes mellitus ([place]) 06/02/2015  . Adaptation reaction 02/15/2015  . Allergic rhinitis 02/15/2015  . Diabetes mellitus, type 2 ([place]) 02/15/2015  . Bite from dog 02/15/2015  . Foot pain 02/15/2015  . Big thyroid 02/15/2015  . BP (high blood pressure) 02/15/2015  . Hypercholesteremia 02/15/2015  . Diabetes ([place]) 02/15/2015  . Gonalgia 02/15/2015  . LBP (low back pain) 02/15/2015  . Hernia of anterior abdominal wall 02/15/2015  . Dermatophytosis of groin 02/15/2015  . Candida vaginitis 02/15/2015  . Acid reflux 11/03/2014  . Abdominal lump 11/19/2012  . Hernia, lateral ventral 11/19/2012  . Intra-abdominal and pelvic swelling, mass and lump, unspecified site 11/19/2012  . Adnexal mass 11/06/2012    Past Surgical History:  Procedure Laterality Date  . ABDOMINAL HYSTERECTOMY     2009  . CHOLECYSTECTOMY  1997  . CORONARY  STENT INTERVENTION N/A 09/13/2016   Procedure: Coronary Stent Intervention;  Surgeon: Yolonda Kida, MD;  Location: [place] [facility];  Service: Cardiovascular;  Laterality: N/A;  . GALLBLADDER SURGERY    . HERNIA REPAIR  6/57/8469   periumbilical with right ooph  . KNEE SURGERY  12/2011   total knee replacement Dr. Duayne Cal  . LEFT HEART CATH AND CORONARY ANGIOGRAPHY N/A 09/13/2016   Procedure: Left Heart Cath and Coronary Angiography;  Surgeon: Yolonda Kida, MD;  Location: [place] [facility];  Service: Cardiovascular;  Laterality: N/A;  . TUBAL LIGATION      Prior to Admission medications   Medication Sig Start Date End Date Taking? Authorizing Provider  aspirin 81 MG tablet Take 1 tablet by mouth daily.    [provider]  atorvastatin (LIPITOR) 80 MG tablet Take 1 tablet (80 mg total) by mouth daily at 6 PM. 09/14/16 10/14/16  Callwood, Dwayne D, MD  Blood Glucose Monitoring Suppl (FIFTY50 GLUCOSE METER 2.0) W/DEVICE KIT Use as directed. 03/17/14   [provider]  glyBURIDE (DIABETA) 2.5 MG tablet Take 1 tablet by mouth daily. 10/08/13   [provider]  insulin aspart (NOVOLOG) 100 UNIT/ML injection Take 12 units twice daily before meals 02/22/15   [provider]  insulin aspart (NOVOLOG) 100 UNIT/ML injection Inject 15 Units into the skin 3 (three) times daily before meals. 09/14/16   Callwood, Dwayne D, MD  insulin glargine (LANTUS) 100 UNIT/ML injection Inject 50 Units into the skin daily.    [provider]  insulin lispro (HUMALOG) 100 UNIT/ML injection Inject 15  Units into the skin 3 (three) times daily before meals.    [provider]  Insulin Pen Needle (B-D UF III MINI PEN NEEDLES) 31G X 5 MM MISC Use 1 needle daily with victoza injection 02/01/15   [provider]  isosorbide mononitrate (IMDUR) 60 MG 24 hr tablet Take 1 tablet (60 mg total) by mouth daily. 09/15/16 10/15/16  Callwood, Dwayne D, MD  lisinopril  (PRINIVIL,ZESTRIL) 20 MG tablet Take 1 tablet (20 mg total) by mouth daily. 09/20/15   Margarita Rana, MD  metFORMIN (GLUMETZA) 1000 MG (MOD) 24 hr tablet Take 1,000 mg by mouth daily with breakfast.      [provider]  metoprolol succinate (TOPROL-XL) 25 MG 24 hr tablet Take 1 tablet (25 mg total) by mouth daily. Take with or immediately following a meal. 09/15/16   Call[place], Dwayne D, MD  MULTIPLE VITAMINS-MINERALS PO Take 1 tablet by mouth daily.    [provider]  pantoprazole (PROTONIX) 40 MG tablet Take 1 tablet (40 mg total) by mouth daily. 10/20/16   Mar Daring, PA-C  ursodiol (ACTIGALL) 300 MG capsule Take 300 mg by mouth 2 (two) times daily.    [provider]    Allergies Amoxicillin-pot clavulanate; Ciprofloxacin; and Clarithromycin  Family History  Problem Relation Age of Onset  . Diabetes Sister   . Diabetes Sister   . Diabetes Sister   . Diabetes Mother   . Heart disease Father     Social History Social History  Substance Use Topics  . Smoking status: Never Smoker  . Smokeless tobacco: Never Used  . Alcohol use No    Review of Systems  Constitutional: Positive for fever. Eyes: No admits. ENT: No sore throat. Cardiovascular: Denies chest pain. Respiratory: Denies shortness of breath. Gastrointestinal: Positive for nausea and vomiting. Genitourinary: Positive for suprapubic pain..  Musculoskeletal: Negative for back pain. Skin: Negative for rash. Neurological: Negative for headache.   ____________________________________________   PHYSICAL EXAM:  VITAL SIGNS: ED Triage Vitals  Enc Vitals Group     BP 02/26/17 1604 125/77     Pulse Rate 02/26/17 1604 93     Resp 02/26/17 1604 18     Temp 02/26/17 1604 (!) 102 F (38.9 C)     Temp Source 02/26/17 1604 Oral     SpO2 02/26/17 1604 98 %     Weight 02/26/17 1605 203 lb (92.1 kg)     Height 02/26/17 1605 5' 9"  (1.753 m)     Head Circumference --      Peak Flow --        Pain Score 02/26/17 1604 8     Pain Loc --      Pain Edu? --      Excl. in [place]? --     Constitutional: Alert and oriented. Well appearing and in no acute distress. Eyes: Conjunctivae are normal.  Head: Atraumatic. Nose: No congestion/rhinnorhea. Mouth/Throat: Mucous membranes are moist.   Neck: Normal range of motion.  Cardiovascular: Normal rate, regular rhythm. Grossly normal heart sounds.  Good peripheral circulation. Respiratory: Normal respiratory effort.  No retractions. Lungs CTAB. Gastrointestinal: Soft with mild suprapubic discomfort, otherwise nontender. No distention.  Genitourinary: No CVA tenderness. Musculoskeletal: No lower extremity edema.  Extremities warm and well perfused.  Neurologic:  Normal speech and language. No gross focal neurologic deficits are appreciated.  Skin:  Skin is warm and dry. No rash noted. Psychiatric: Mood and affect are normal. Speech and behavior are normal.  ____________________________________________  LABS (all labs ordered are listed, but only abnormal results are displayed)  Labs Reviewed  COMPREHENSIVE METABOLIC PANEL - Abnormal; Notable for the following:       Result Value   Potassium 3.3 (*)    Chloride 100 (*)    Glucose, Bld 196 (*)    Total Protein 8.5 (*)    Albumin 3.3 (*)    AST 104 (*)    ALT 134 (*)    Alkaline Phosphatase 430 (*)    All other components within normal limits  URINALYSIS, COMPLETE (UACMP) WITH MICROSCOPIC - Abnormal; Notable for the following:    Color, Urine YELLOW (*)    APPearance CLEAR (*)    Glucose, UA >=500 (*)    Leukocytes, UA SMALL (*)    Bacteria, UA MANY (*)    Squamous Epithelial / LPF 0-5 (*)    All other components within normal limits  LIPASE, BLOOD  CBC   ____________________________________________  EKG   ____________________________________________  RADIOLOGY    ____________________________________________   PROCEDURES  Procedure(s) performed:  No    Critical Care performed: No ____________________________________________   INITIAL IMPRESSION / ASSESSMENT AND PLAN / ED COURSE  Pertinent labs & imaging results that were available during my care of the patient were reviewed by me and considered in my medical decision making (see chart for details).  56 year-old female with hypertension and diabetes, and other past medical history as noted, presents with 2 days of fever, associated with nausea and vomiting today, as well as suprapubic pain and some urinary frequency. On exam, patient is febrile, but other vital signs are normal, she is well-appearing, and exam is otherwise unremarkable except for mild suprapubic discomfort.  Initial lab workup reveals a UA that is consistent with UTI, but showing no ketones or evidence of DKA. Chemistry is unremarkable; patient's LFTs and alk phos similarly elevated on prior labs.  Overall presentation consistent with UTI. Given the minimal abdominal tenderness there is no indication for imaging.  Plan: fluids, symptomatic tx, and d/c with abx for UTI if tolerating PO.     ----------------------------------------- 6:51 PM on 02/26/2017 -----------------------------------------  She states she is feeling better, and she is tolerating PO. Will discharge with antibiotics. Return precautions given.  ____________________________________________   FINAL CLINICAL IMPRESSION(S) / ED DIAGNOSES  Final diagnoses:  Acute cystitis without hematuria      NEW MEDICATIONS STARTED DURING THIS VISIT:  New Prescriptions   No medications on file     Note:  This document was prepared using Dragon voice recognition software and may include unintentional dictation errors.    Arta Silence, MD 02/26/17 [phone]

## 2017-02-26 NOTE — ED Triage Notes (Signed)
Patient presents to the ED with fever x 2 days and nausea and vomiting that began today.  Patient reports abdomen is sore from vomiting.  Patient appears uncomfortable in triage.  Patient states she has not been able to keep any food down today and is concerned because she is a diabetic.  Patient reports feeling very cold and achy.

## 2017-03-03 ENCOUNTER — Emergency Department
Admission: EM | Admit: 2017-03-03 | Discharge: 2017-03-03 | Disposition: A | Discharge status: Home / Self Care | Payer: 59 | Attending: Emergency Medicine | Admitting: Emergency Medicine

## 2017-03-03 ENCOUNTER — Ambulatory Visit: Payer: 59 | Admitting: Family Medicine

## 2017-03-03 ENCOUNTER — Encounter: Payer: Self-pay | Admitting: Emergency Medicine

## 2017-03-03 ENCOUNTER — Emergency Department: Payer: 59

## 2017-03-03 ENCOUNTER — Encounter: Payer: Self-pay | Admitting: Family Medicine

## 2017-03-03 DIAGNOSIS — Z794 Long term (current) use of insulin: Secondary | ICD-10-CM | POA: Insufficient documentation

## 2017-03-03 DIAGNOSIS — R05 Cough: Secondary | ICD-10-CM | POA: Diagnosis not present

## 2017-03-03 DIAGNOSIS — R1030 Lower abdominal pain, unspecified: Secondary | ICD-10-CM | POA: Diagnosis not present

## 2017-03-03 DIAGNOSIS — Z7982 Long term (current) use of aspirin: Secondary | ICD-10-CM | POA: Insufficient documentation

## 2017-03-03 DIAGNOSIS — I10 Essential (primary) hypertension: Secondary | ICD-10-CM | POA: Insufficient documentation

## 2017-03-03 DIAGNOSIS — Z880 Allergy status to penicillin: Secondary | ICD-10-CM | POA: Insufficient documentation

## 2017-03-03 DIAGNOSIS — E119 Type 2 diabetes mellitus without complications: Secondary | ICD-10-CM | POA: Diagnosis not present

## 2017-03-03 DIAGNOSIS — Z79899 Other long term (current) drug therapy: Secondary | ICD-10-CM | POA: Insufficient documentation

## 2017-03-03 DIAGNOSIS — R509 Fever, unspecified: Secondary | ICD-10-CM | POA: Diagnosis not present

## 2017-03-03 DIAGNOSIS — R109 Unspecified abdominal pain: Secondary | ICD-10-CM | POA: Diagnosis not present

## 2017-03-03 DIAGNOSIS — R1032 Left lower quadrant pain: Secondary | ICD-10-CM | POA: Diagnosis not present

## 2017-03-03 DIAGNOSIS — R1031 Right lower quadrant pain: Secondary | ICD-10-CM | POA: Diagnosis not present

## 2017-03-03 LAB — URINALYSIS, COMPLETE (UACMP) WITH MICROSCOPIC
Bilirubin Urine: NEGATIVE
Glucose, UA: 50 mg/dL — AB
Hgb urine dipstick: NEGATIVE
Ketones, ur: NEGATIVE mg/dL
Nitrite: NEGATIVE
Protein, ur: 100 mg/dL — AB
Specific Gravity, Urine: 1.014 (ref 1.005–1.030)
pH: 6 (ref 5.0–8.0)

## 2017-03-03 LAB — COMPREHENSIVE METABOLIC PANEL
ALT: 114 U/L — ABNORMAL HIGH (ref 14–54)
AST: 87 U/L — ABNORMAL HIGH (ref 15–41)
Albumin: 3.2 g/dL — ABNORMAL LOW (ref 3.5–5.0)
Alkaline Phosphatase: 573 U/L — ABNORMAL HIGH (ref 38–126)
Anion gap: 12 (ref 5–15)
BUN: 12 mg/dL (ref 6–20)
CO2: 26 mmol/L (ref 22–32)
Calcium: 9 mg/dL (ref 8.9–10.3)
Chloride: 96 mmol/L — ABNORMAL LOW (ref 101–111)
Creatinine, Ser: 0.65 mg/dL (ref 0.44–1.00)
GFR calc Af Amer: >60 mL/min (ref >60)
GFR calc non Af Amer: >60 mL/min (ref >60)
Glucose, Bld: 208 mg/dL — ABNORMAL HIGH (ref 65–99)
Potassium: 3 mmol/L — ABNORMAL LOW (ref 3.5–5.1)
Sodium: 134 mmol/L — ABNORMAL LOW (ref 135–145)
Total Bilirubin: 0.9 mg/dL (ref 0.3–1.2)
Total Protein: 9.1 g/dL — ABNORMAL HIGH (ref 6.5–8.1)

## 2017-03-03 LAB — CBC
HCT: 39.3 % (ref 35.0–47.0)
Hemoglobin: 12.8 g/dL (ref 12.0–16.0)
MCH: 26 pg (ref 26.0–34.0)
MCHC: 32.7 g/dL (ref 32.0–36.0)
MCV: 79.4 fL — ABNORMAL LOW (ref 80.0–100.0)
Platelets: 138 10*3/uL — ABNORMAL LOW (ref 150–440)
RBC: 4.95 MIL/uL (ref 3.80–5.20)
RDW: 13.6 % (ref 11.5–14.5)
WBC: 7.7 10*3/uL (ref 3.6–11.0)

## 2017-03-03 LAB — LIPASE, BLOOD: Lipase: 32 U/L (ref 11–51)

## 2017-03-03 MED ORDER — IOPAMIDOL (ISOVUE-300) INJECTION 61%
30.0000 mL | Freq: Once | INTRAVENOUS | Status: AC | PRN
  Administered 2017-03-03: 30 mL via ORAL

## 2017-03-03 MED ORDER — GI COCKTAIL ~~LOC~~
30.0000 mL | Freq: Once | ORAL | Status: AC
  Administered 2017-03-03: 30 mL via ORAL
  Filled 2017-03-03: qty 30

## 2017-03-03 MED ORDER — FAMOTIDINE 40 MG PO TABS: 40.0000 mg | ORAL_TABLET | Freq: Every evening | ORAL | 1 refills | Status: DC

## 2017-03-03 MED ORDER — DOXYCYCLINE HYCLATE 100 MG PO TABS: 100.0000 mg | ORAL_TABLET | Freq: Two times a day (BID) | ORAL | 0 refills | Status: DC

## 2017-03-03 MED ORDER — HYDROCODONE-ACETAMINOPHEN 5-325 MG PO TABS: 1.0000 | ORAL_TABLET | ORAL | 0 refills | Status: DC | PRN

## 2017-03-03 MED ORDER — SODIUM CHLORIDE 0.9 % IV BOLUS (SEPSIS)
1000.0000 mL | Freq: Once | INTRAVENOUS | Status: AC
  Administered 2017-03-03: 1000 mL via INTRAVENOUS

## 2017-03-03 MED ORDER — MORPHINE SULFATE (PF) 4 MG/ML IV SOLN
4.0000 mg | Freq: Once | INTRAVENOUS | Status: AC
  Administered 2017-03-03: 4 mg via INTRAVENOUS
  Filled 2017-03-03: qty 1

## 2017-03-03 MED ORDER — IOPAMIDOL (ISOVUE-300) INJECTION 61%
100.0000 mL | Freq: Once | INTRAVENOUS | Status: AC | PRN
  Administered 2017-03-03: 100 mL via INTRAVENOUS

## 2017-03-03 MED ORDER — ONDANSETRON HCL 4 MG/2ML IJ SOLN
4.0000 mg | Freq: Once | INTRAMUSCULAR | Status: AC | PRN
  Administered 2017-03-03: 4 mg via INTRAVENOUS
  Filled 2017-03-03: qty 2

## 2017-03-03 MED ORDER — ONDANSETRON HCL 4 MG/2ML IJ SOLN
4.0000 mg | Freq: Once | INTRAMUSCULAR | Status: AC
  Administered 2017-03-03: 4 mg via INTRAVENOUS
  Filled 2017-03-03: qty 2

## 2017-03-03 NOTE — Discharge Instructions (Addendum)
As discussed please talk to your primary care doctor about scheduling a biopsy of your lymph node. Also you will need a repeat CT scan of your chest in 6-12 months to evaluate any change in the nodule noted today. Please seek medical attention for any high fevers, chest pain, shortness of breath, change in behavior, persistent vomiting, bloody stool or any other new or concerning symptoms.

## 2017-03-03 NOTE — ED Notes (Signed)
CT notified patient is done with contrast 

## 2017-03-03 NOTE — ED Notes (Signed)
MD at bedside. 

## 2017-03-03 NOTE — ED Provider Notes (Signed)
[facility] Emergency Department Provider Note  Time seen: 1:16 PM  I have reviewed the triage vital signs and the nursing notes.   HISTORY  Chief Complaint Abdominal Pain    HPI Lisa [name] is a 56 y.o. female with a past medical history of diabetes, gastric reflux, hypertension, status post cholecystectomy, presents to the emergency department for continued abdominal pain.  According to the patient for the past 1 week she has been expensing lower abdominal pain and fever as high as 102 earlier in the week.  States nausea with one episode of vomiting today.  Denies diarrhea.  Denies black or bloody stool.  Denies upper abdominal pain.  Currently describes her pain as moderate to severe located in the suprapubic region.   Past Medical History:  Diagnosis Date  . Arthritis   . Cirrhosis ([place])   . Diabetes mellitus   . GERD (gastroesophageal reflux disease)   . Hypertension     Patient Active Problem List   Diagnosis Date Noted  . S/P drug eluting coronary stent placement 09/13/2016  . Elevated liver enzymes 09/23/2015  . Diabetes mellitus ([place]) 06/02/2015  . Adaptation reaction 02/15/2015  . Allergic rhinitis 02/15/2015  . Diabetes mellitus, type 2 ([place]) 02/15/2015  . Bite from dog 02/15/2015  . Foot pain 02/15/2015  . Big thyroid 02/15/2015  . BP (high blood pressure) 02/15/2015  . Hypercholesteremia 02/15/2015  . Diabetes ([place]) 02/15/2015  . Gonalgia 02/15/2015  . LBP (low back pain) 02/15/2015  . Hernia of anterior abdominal wall 02/15/2015  . Dermatophytosis of groin 02/15/2015  . Candida vaginitis 02/15/2015  . Acid reflux 11/03/2014  . Abdominal lump 11/19/2012  . Hernia, lateral ventral 11/19/2012  . Intra-abdominal and pelvic swelling, mass and lump, unspecified site 11/19/2012  . Adnexal mass 11/06/2012    Past Surgical History:  Procedure Laterality Date  . ABDOMINAL HYSTERECTOMY     2009  . CHOLECYSTECTOMY  1997  .  CORONARY STENT INTERVENTION N/A 09/13/2016   Procedure: Coronary Stent Intervention;  Surgeon: Yolonda Kida, MD;  Location: [place] [facility];  Service: Cardiovascular;  Laterality: N/A;  . GALLBLADDER SURGERY    . HERNIA REPAIR  1/79/1505   periumbilical with right ooph  . KNEE SURGERY  12/2011   total knee replacement Dr. Duayne Cal  . LEFT HEART CATH AND CORONARY ANGIOGRAPHY N/A 09/13/2016   Procedure: Left Heart Cath and Coronary Angiography;  Surgeon: Yolonda Kida, MD;  Location: [place] [facility];  Service: Cardiovascular;  Laterality: N/A;  . TUBAL LIGATION      Prior to Admission medications   Medication Sig Start Date End Date Taking? Authorizing Provider  aspirin 81 MG tablet Take 1 tablet by mouth daily.    [provider]  atorvastatin (LIPITOR) 80 MG tablet Take 1 tablet (80 mg total) by mouth daily at 6 PM. 09/14/16 10/14/16  Callwood, Dwayne D, MD  Blood Glucose Monitoring Suppl (FIFTY50 GLUCOSE METER 2.0) W/DEVICE KIT Use as directed. 03/17/14   [provider]  cephALEXin (KEFLEX) 500 MG capsule Take 1 capsule (500 mg total) by mouth 3 (three) times daily. 02/26/17 03/05/17  Arta Silence, MD  glyBURIDE (DIABETA) 2.5 MG tablet Take 1 tablet by mouth daily. 10/08/13   [provider]  insulin aspart (NOVOLOG) 100 UNIT/ML injection Take 12 units twice daily before meals 02/22/15   [provider]  insulin aspart (NOVOLOG) 100 UNIT/ML injection Inject 15 Units into the skin 3 (three) times daily before  meals. 09/14/16   Callwood, Dwayne D, MD  insulin glargine (LANTUS) 100 UNIT/ML injection Inject 50 Units into the skin daily.    [provider]  insulin lispro (HUMALOG) 100 UNIT/ML injection Inject 15 Units into the skin 3 (three) times daily before meals.    [provider]  Insulin Pen Needle (B-D UF III MINI PEN NEEDLES) 31G X 5 MM MISC Use 1 needle daily with victoza injection 02/01/15   [provider]   isosorbide mononitrate (IMDUR) 60 MG 24 hr tablet Take 1 tablet (60 mg total) by mouth daily. 09/15/16 10/15/16  Callwood, Dwayne D, MD  lisinopril (PRINIVIL,ZESTRIL) 20 MG tablet Take 1 tablet (20 mg total) by mouth daily. 09/20/15   Margarita Rana, MD  metFORMIN (GLUMETZA) 1000 MG (MOD) 24 hr tablet Take 1,000 mg by mouth daily with breakfast.      [provider]  metoprolol succinate (TOPROL-XL) 25 MG 24 hr tablet Take 1 tablet (25 mg total) by mouth daily. Take with or immediately following a meal. 09/15/16   Callwood, Dwayne D, MD  MULTIPLE VITAMINS-MINERALS PO Take 1 tablet by mouth daily.    [provider]  ondansetron (ZOFRAN) 4 MG tablet Take 1 tablet (4 mg total) by mouth every 8 (eight) hours as needed for nausea or vomiting. 02/26/17 03/28/17  Arta Silence, MD  pantoprazole (PROTONIX) 40 MG tablet Take 1 tablet (40 mg total) by mouth daily. 10/20/16   Mar Daring, PA-C  ursodiol (ACTIGALL) 300 MG capsule Take 300 mg by mouth 2 (two) times daily.    [provider]    Allergies  Allergen Reactions  . Amoxicillin-Pot Clavulanate   . Ciprofloxacin   . Clarithromycin     Family History  Problem Relation Age of Onset  . Diabetes Sister   . Diabetes Sister   . Diabetes Sister   . Diabetes Mother   . Heart disease Father     Social History Social History  Substance Use Topics  . Smoking status: Never Smoker  . Smokeless tobacco: Never Used  . Alcohol use No    Review of Systems Constitutional: Positive for fever Cardiovascular: Negative for chest pain. Respiratory: Negative for shortness of breath. Gastrointestinal: Positive for lower abdominal pain, one episode of vomiting today.  Negative for diarrhea.  Negative for black or bloody stool. Genitourinary: Negative for dysuria.  Negative for hematuria Musculoskeletal: Negative for back pain. Neurological: Negative for headache All other ROS  negative  ____________________________________________   PHYSICAL EXAM:  VITAL SIGNS: ED Triage Vitals  Enc Vitals Group     BP 03/03/17 1212 116/62     Pulse Rate 03/03/17 1212 (!) 102     Resp 03/03/17 1212 18     Temp 03/03/17 1212 100.2 F (37.9 C)     Temp Source 03/03/17 1212 Oral     SpO2 03/03/17 1212 98 %     Weight 03/03/17 1213 203 lb (92.1 kg)     Height 03/03/17 1213 _0  (1.753 m)     Head Circumference --      Peak Flow --      Pain Score 03/03/17 1211 10     Pain Loc --      Pain Edu? --      Excl. in [place]? --     Constitutional: Alert and oriented. Well appearing and in no distress. Eyes: Normal exam ENT   Head: Normocephalic and atraumatic.   Mouth/Throat: Mucous membranes are moist. Cardiovascular: Normal rate,  regular rhythm. No murmur Respiratory: Normal respiratory effort without tachypnea nor retractions. Breath sounds are clear  Gastrointestinal: Soft, moderate lower abdominal tenderness in the right lower quadrant midline and left lower quadrant.  No rebound or guarding.  No distention. Musculoskeletal: Nontender with normal range of motion in all extremities.  Neurologic:  Normal speech and language. No gross focal neurologic deficits Skin:  Skin is warm, dry and intact.  Psychiatric: Mood and affect are normal.   ____________________________________________    RADIOLOGY  IMPRESSION: No acute findings in abdomen/pelvis.  Heterogeneous opacification over the lung bases left greater than right likely atelectasis, although cannot exclude infection. 6 mm nodule over the lingula. Recommend follow-up CT 4-6 weeks.  Nonspecific adenopathy over the lower chest and upper abdomen as described most prominent over the porta hepatis with the largest node measuring 1.9 cm by short axis. No definite primary neoplasm identified, although cannot exclude lymphoma/leukemia. Consider complete chest CT for further evaluation.  Bilateral hernia  repairs as described. There is a recurrent hernia just inferior to the left lower abdominal hernia repair site containing only peritoneal fat. Tiny umbilical hernia containing only peritoneal fat.  Subcentimeter hypodensity over the upper pole left kidney too small to characterize but likely a cyst.  Atherosclerotic coronary artery disease.  ____________________________________________   INITIAL IMPRESSION / ASSESSMENT AND PLAN / ED COURSE  Pertinent labs & imaging results that were available during my care of the patient were reviewed by me and considered in my medical decision making (see chart for details).  Patient presents to the emergency department for lower abdominal pain continued fever over the past 1 week.  Patient was diagnosed with urinary tract infection naproxen 1 week ago was placed on Keflex which she has finished but continues to have pain as well as low-grade fever including 100.2 today.  Differential would include urinary tract infection, pyelonephritis, colitis, diverticulitis, appendicitis or other intra-abdominal pathology.  We will recheck labs today.  We will obtain a CT scan of the abdomen/pelvis.  We will treat pain and nausea as well as IV hydrate.  Patient's labs show elevated LFTs however this appears to be baseline for the patient.  Normal white blood cell count.  Urinalysis shows 6-30 white and red cells but also 6-30 squamous cells and no significant amount of bacteria.  Urine culture will be sent.  Will obtain a CT scan to further evaluate.  Patient agreeable with this plan of care.  I have reviewed the patient's records including her most recent ER visit and workup.  CT scan essentially shows no acute findings in the abdomen or pelvis.  The patient does have opacification in the left lung base as well as enlarged lymph nodes.  We will consider a chest CT to further evaluate.  Patient states her pain is gone at this time.  If the chest CT continues to show  opacification of the lung base we will likely cover with antibiotics given the patient's continued fever, and a short course of pain medication.  CT of the chest is pending, patient care signed out to Dr. Archie Balboa. ____________________________________________   FINAL CLINICAL IMPRESSION(S) / ED DIAGNOSES  Abdominal pain Fever   Harvest Dark, MD 03/03/17 1506

## 2017-03-03 NOTE — ED Provider Notes (Signed)
CT chest without obvious infection. Again visualized some enlarged lymph nodes concerning for possible lymphoma. I discussed this finding with the patient. Discussed importance of follow up with PCP to arrange biopsy and repeat CT scan for pulmonary nodule identified.    Nance Pear, MD 03/03/17 [phone]

## 2017-03-03 NOTE — ED Notes (Signed)
Pt refused a wheelchair and was able to ambulate to parking lot independently. RN explained temperature management at home and pt verbalized understanding of all discharge instructions. Pt in NAD at time of discharge.

## 2017-03-03 NOTE — ED Triage Notes (Signed)
Pt seen here on Monday, for cystitis, pt states she is not getting any better. States she finished the atbx. Pt states she is still running a fever and having suprapubic abdominal pain 10/10.  Pt states she never got better since being here. No meds taken at home for fever. States it was 100 at home.

## 2017-03-05 ENCOUNTER — Ambulatory Visit (INDEPENDENT_AMBULATORY_CARE_PROVIDER_SITE_OTHER): Payer: 59 | Admitting: Physician Assistant

## 2017-03-05 ENCOUNTER — Ambulatory Visit: Payer: 59 | Admitting: Family Medicine

## 2017-03-05 VITALS — BP 104/60 | HR 76 | Temp 99.7°F | Resp 16 | Wt 206.0 lb

## 2017-03-05 DIAGNOSIS — R509 Fever, unspecified: Secondary | ICD-10-CM

## 2017-03-05 DIAGNOSIS — R300 Dysuria: Secondary | ICD-10-CM | POA: Diagnosis not present

## 2017-03-05 DIAGNOSIS — R911 Solitary pulmonary nodule: Secondary | ICD-10-CM | POA: Diagnosis not present

## 2017-03-05 DIAGNOSIS — I10 Essential (primary) hypertension: Secondary | ICD-10-CM | POA: Diagnosis not present

## 2017-03-05 DIAGNOSIS — R42 Dizziness and giddiness: Secondary | ICD-10-CM | POA: Diagnosis not present

## 2017-03-05 DIAGNOSIS — R634 Abnormal weight loss: Secondary | ICD-10-CM | POA: Diagnosis not present

## 2017-03-05 DIAGNOSIS — E119 Type 2 diabetes mellitus without complications: Secondary | ICD-10-CM

## 2017-03-05 DIAGNOSIS — B3731 Acute candidiasis of vulva and vagina: Secondary | ICD-10-CM

## 2017-03-05 DIAGNOSIS — E876 Hypokalemia: Secondary | ICD-10-CM

## 2017-03-05 DIAGNOSIS — R918 Other nonspecific abnormal finding of lung field: Secondary | ICD-10-CM

## 2017-03-05 DIAGNOSIS — R599 Enlarged lymph nodes, unspecified: Secondary | ICD-10-CM

## 2017-03-05 DIAGNOSIS — B373 Candidiasis of vulva and vagina: Secondary | ICD-10-CM

## 2017-03-05 MED ORDER — TERCONAZOLE 0.4 % VA CREA: 1.0000 | TOPICAL_CREAM | Freq: Every day | VAGINAL | 0 refills | Status: DC

## 2017-03-05 MED ORDER — POTASSIUM CHLORIDE ER 10 MEQ PO TBCR: 10.0000 meq | EXTENDED_RELEASE_TABLET | Freq: Two times a day (BID) | ORAL | 0 refills | Status: DC

## 2017-03-05 NOTE — Progress Notes (Signed)
Patient: Lisa [name] Female    DOB: 10-16-1960   56 y.o.   MRN: [id number] Visit Date: 03/05/2017  Today's Provider: Trinna Post, PA-C   Chief Complaint  Patient presents with  . Follow-up    ER Follow up on 03/03/2017   Subjective:    Lisa [name] is a 56 y/o woman with uncontrolled Type II DM on insulin, unstable angina with multivessel disease and drug eluting stent since 09/2016, and primary sclerosing cholangitis with chronically elevated LFTs presenting today for ER follow up. [name] had previously presented to ER and was treated with Keflex on 02/26/2017. She represented to the ER over the weekend for abdominal pain, nausea, and vomiting. Labs were significant for elevated LFTs, elevated alk phos, hypokalemia, hyperglycemia. Her white count was normal. Urinalysis with large leukocytes but no cx was sent. CT abdomen/pelvis significant for adenopathy over lower chest and upper abdomen, specifically around porta hepatis. There was lung base opacification that was confirmed on CT chest and thought to represent atelectasis. Additional carinal adenopathy was found on CT chest was well as 7 mm pulmonary nodule in lingula with recommendations for follow up future CT. She was prescribed doxycycline and told to follow up with PCP.  Today, she is in the clinic currently taking doxycycline. She had completed her prior course of Keflex. She says she feels dizzy, has a temperature of 99.7 F, SOB. She relates a two month history of cold chills and sweating. Since 09/2016, she has lost 22 pounds unintentionally. Of note, she is on insulin for Type II DM and she started trulicity 1 mo ago. She discontinued this medication last week 2/2 nausea and vomiting, which has improved since discontinuing. She has a history of unstable angina and multi vessel disease with stent placement in 09/2016. She is on Lisinopril, metoprolol, and Aspirin for this purpose.   Also asking for refill of  terconazole for candida vaginitis.   Fever   This is a recurrent problem. The current episode started 1 to 4 weeks ago. The problem has been waxing and waning. The maximum temperature noted was 103 to 103.9 F (The highest temp was 103 seven days ago.  Most recent was 102 on Saturday). Associated symptoms include abdominal pain, coughing, diarrhea, headaches, nausea and vomiting. Pertinent negatives include no congestion or wheezing.       Allergies  Allergen Reactions  . Amoxicillin-Pot Clavulanate   . Ciprofloxacin   . Clarithromycin      Current Outpatient Prescriptions:  .  aspirin 81 MG tablet, Take 1 tablet by mouth daily., Disp: , Rfl:  .  Blood Glucose Monitoring Suppl (FIFTY50 GLUCOSE METER 2.0) W/DEVICE KIT, Use as directed., Disp: , Rfl:  .  doxycycline (VIBRA-TABS) 100 MG tablet, Take 1 tablet (100 mg total) by mouth 2 (two) times daily., Disp: 20 tablet, Rfl: 0 .  famotidine (PEPCID) 40 MG tablet, Take 1 tablet (40 mg total) by mouth every evening., Disp: 30 tablet, Rfl: 1 .  glyBURIDE (DIABETA) 2.5 MG tablet, Take 1 tablet by mouth daily., Disp: , Rfl:  .  insulin glargine (LANTUS) 100 UNIT/ML injection, Inject 50 Units into the skin daily., Disp: , Rfl:  .  Insulin Pen Needle (B-D UF III MINI PEN NEEDLES) 31G X 5 MM MISC, Use 1 needle daily with victoza injection, Disp: , Rfl:  .  lisinopril (PRINIVIL,ZESTRIL) 20 MG tablet, Take 1 tablet (20 mg total) by mouth daily., Disp: 90 tablet, Rfl: 3 .  metFORMIN (GLUMETZA) 1000 MG (MOD) 24 hr tablet, Take 1,000 mg by mouth daily with breakfast.  , Disp: , Rfl:  .  MULTIPLE VITAMINS-MINERALS PO, Take 1 tablet by mouth daily., Disp: , Rfl:  .  ursodiol (ACTIGALL) 300 MG capsule, Take 300 mg by mouth 2 (two) times daily., Disp: , Rfl:  .  atorvastatin (LIPITOR) 80 MG tablet, Take 1 tablet (80 mg total) by mouth daily at 6 PM., Disp: 30 tablet, Rfl: 12 .  HYDROcodone-acetaminophen (NORCO/VICODIN) 5-325 MG tablet, Take 1 tablet by  mouth every 4 (four) hours as needed. (Patient not taking: Reported on 03/05/2017), Disp: 15 tablet, Rfl: 0 .  insulin aspart (NOVOLOG) 100 UNIT/ML injection, Take 12 units twice daily before meals, Disp: , Rfl:  .  insulin aspart (NOVOLOG) 100 UNIT/ML injection, Inject 15 Units into the skin 3 (three) times daily before meals. (Patient not taking: Reported on 03/05/2017), Disp: 10 mL, Rfl: 11 .  insulin lispro (HUMALOG) 100 UNIT/ML injection, Inject 15 Units into the skin 3 (three) times daily before meals., Disp: , Rfl:  .  isosorbide mononitrate (IMDUR) 60 MG 24 hr tablet, Take 1 tablet (60 mg total) by mouth daily., Disp: 30 tablet, Rfl: 6 .  metoprolol succinate (TOPROL-XL) 25 MG 24 hr tablet, Take 1 tablet (25 mg total) by mouth daily. Take with or immediately following a meal. (Patient not taking: Reported on 03/05/2017), Disp: 30 tablet, Rfl: 6 .  ondansetron (ZOFRAN) 4 MG tablet, Take 1 tablet (4 mg total) by mouth every 8 (eight) hours as needed for nausea or vomiting. (Patient not taking: Reported on 03/05/2017), Disp: 10 tablet, Rfl: 0 .  pantoprazole (PROTONIX) 40 MG tablet, Take 1 tablet (40 mg total) by mouth daily. (Patient not taking: Reported on 03/05/2017), Disp: 90 tablet, Rfl: 1  Review of Systems  Constitutional: Positive for fatigue and fever. Negative for activity change, appetite change, chills, diaphoresis and unexpected weight change.  HENT: Negative for congestion.   Respiratory: Positive for cough. Negative for apnea, choking, chest tightness, shortness of breath, wheezing and stridor.   Cardiovascular: Negative.   Gastrointestinal: Positive for abdominal pain, constipation, diarrhea, nausea and vomiting. Negative for abdominal distention, anal bleeding, blood in stool and rectal pain.  Neurological: Positive for dizziness, weakness, light-headedness and headaches.    Social History  Substance Use Topics  . Smoking status: Never Smoker  . Smokeless tobacco: Never  Used  . Alcohol use No   Objective:   BP 104/60 (BP Location: Left Arm, Patient Position: Sitting, Cuff Size: Large)   Pulse 76   Temp 99.7 F (37.6 C) (Oral)   Wt 206 lb (93.4 kg)   BMI 30.42 kg/m  Vitals:   03/05/17 1602  BP: 104/60  Pulse: 76  Temp: 99.7 F (37.6 C)  TempSrc: Oral  Weight: 206 lb (93.4 kg)     Physical Exam  Constitutional: She is oriented to person, place, and time. She appears well-nourished. She appears ill. No distress.  Eyes: Conjunctivae are normal.  Neck: Neck supple. No thyromegaly present.  Cardiovascular: Normal rate, regular rhythm and normal heart sounds.   Pulmonary/Chest: Effort normal and breath sounds normal.  Abdominal: Soft. Bowel sounds are normal. She exhibits no distension. There is no tenderness.  Lymphadenopathy:    She has no cervical adenopathy.  Neurological: She is alert and oriented to person, place, and time.  Skin: Skin is warm and dry.  Psychiatric: She has a normal mood and affect. Her behavior is normal.  Assessment & Plan:     1. Enlarged lymph nodes  Presentation concerning for malignancy. Will refer to oncology for further management of biopsy. Should complete course of doxycycline in the mean time.   - Ambulatory referral to Oncology  2. Fever, unspecified fever cause  - Ambulatory referral to Oncology  3. Unintended weight loss  - Ambulatory referral to Oncology  4. Essential hypertension  History of drug eluting stent in 09/2016, on Lisinopril, metoprolol, and ASA. Due to low BP and dizziness, should push fluids. Can cut metoprolol in half until BP comes back up.  5. Type 2 diabetes mellitus without complication, without long-term current use of insulin ([place])  Should call endocrinology for sick plan regarding her insulin.   6. Candida vaginitis  - terconazole (TERAZOL 7) 0.4 % vaginal cream; Place 1 applicator vaginally at bedtime.  Dispense: 45 g; Refill: 0  7. Pulmonary nodule  Ordered  CT for 6 months future.  - CT Chest Wo Contrast; Future  8. Abnormal findings on diagnostic imaging of lung  - CT Chest Wo Contrast; Future  9. Dysuria  - POCT urinalysis dipstick - Urine Culture  10. Hypokalemia  - potassium chloride (K-DUR) 10 MEQ tablet; Take 1 tablet (10 mEq total) by mouth 2 (two) times daily.  Dispense: 60 tablet; Refill: 0  11. Dizziness  Push fluids. May cut metoprolol until BP normalizes.   Return if symptoms worsen or fail to improve.  The entirety of the information documented in the History of Present Illness, Review of Systems and Physical Exam were personally obtained by me. Portions of this information were initially documented by Ashley Royalty, CMA and reviewed by me for thoroughness and accuracy.        Trinna Post, PA-C  [facility]

## 2017-03-05 NOTE — Patient Instructions (Addendum)
Please cut 25 mg metoprolol in half Please take 10 Meq potassium twice daily    Lymphadenopathy Lymphadenopathy refers to swollen or enlarged lymph glands, also called lymph nodes. Lymph glands are part of your body's defense (immune) system, which protects the body from infections, germs, and diseases. Lymph glands are found in many locations in your body, including the neck, underarm, and groin. Many things can cause lymph glands to become enlarged. When your immune system responds to germs, such as viruses or bacteria, infection-fighting cells and fluid build up. This causes the glands to grow in size. Usually, this is not something to worry about. The swelling and any soreness often go away without treatment. However, swollen lymph glands can also be caused by a number of diseases. Your health care provider may do various tests to help determine the cause. If the cause of your swollen lymph glands cannot be found, it is important to monitor your condition to make sure the swelling goes away. Follow these instructions at home: Watch your condition for any changes. The following actions may help to lessen any discomfort you are feeling:  Get plenty of rest.  Take medicines only as directed by your health care provider. Your health care provider may recommend over-the-counter medicines for pain.  Apply moist heat compresses to the site of swollen lymph nodes as directed by your health care provider. This can help reduce any pain.  Check your lymph nodes daily for any changes.  Keep all follow-up visits as directed by your health care provider. This is important.  Contact a health care provider if:  Your lymph nodes are still swollen after 2 weeks.  Your swelling increases or spreads to other areas.  Your lymph nodes are hard, seem fixed to the skin, or are growing rapidly.  Your skin over the lymph nodes is red and inflamed.  You have a fever.  You have chills.  You have  fatigue.  You develop a sore throat.  You have abdominal pain.  You have weight loss.  You have night sweats. Get help right away if:  You notice fluid leaking from the area of the enlarged lymph node.  You have severe pain in any area of your body.  You have chest pain.  You have shortness of breath. This information is not intended to replace advice given to you by your health care provider. Make sure you discuss any questions you have with your health care provider. Document Released: 02/01/2008 Document Revised: 09/30/2015 Document Reviewed: 11/27/2013 Elsevier Interactive Patient Education  [employer].

## 2017-03-06 ENCOUNTER — Telehealth: Payer: Self-pay | Admitting: Physician Assistant

## 2017-03-06 LAB — POCT URINALYSIS DIPSTICK
Bilirubin, UA: NEGATIVE
Blood, UA: NEGATIVE
Glucose, UA: 250
Ketones, UA: NEGATIVE
Nitrite, UA: NEGATIVE
Spec Grav, UA: 1.02
Urobilinogen, UA: 1 U/dL
pH, UA: 6.5

## 2017-03-06 NOTE — Telephone Encounter (Signed)
Spoke with Judson Roch, CT to be scheduled in 6 mo for pulmonary nodule.

## 2017-03-06 NOTE — Telephone Encounter (Signed)
Do you want CT of chest scheduled now since she just recently had one or do I need to wait 6 months to schedule ?

## 2017-03-08 LAB — URINE CULTURE
MICRO NUMBER:: 81215200
SPECIMEN QUALITY:: ADEQUATE

## 2017-03-09 ENCOUNTER — Telehealth: Payer: Self-pay

## 2017-03-09 NOTE — Telephone Encounter (Signed)
-----   Message from Trinna Post, [place] sent at 03/08/2017  1:28 PM EDT ----- Urine culture did not grow anything. Please continue with oncology referral.

## 2017-03-09 NOTE — Telephone Encounter (Signed)
Pt advised.   Thanks,   -Laura  

## 2017-03-18 DIAGNOSIS — R591 Generalized enlarged lymph nodes: Secondary | ICD-10-CM

## 2017-03-18 HISTORY — DX: Generalized enlarged lymph nodes: R59.1

## 2017-03-18 NOTE — Progress Notes (Signed)
[place]  [phone] [phone]  ID: Lisa [name] OB: 08/18/1960  MR#: [id number]  JJK#:[id number]  Patient Care Team: Rubye Beach as PCP - General (Family Medicine)  CHIEF COMPLAINT: Lymphadenopathy.  INTERVAL HISTORY: Patient is a 56 year old female who recently presented to the emergency room complaining of diffuse abdominal pain.  Subsequent CT scan revealed mild lymphadenopathy, but no distinct etiology for her pain.  She is highly anxious, but otherwise feels well.  She has no neurologic complaints.  She denies any recent fevers, night sweats, or weight loss.  She has no chest pain or shortness of breath.  She denies any nausea, vomiting, constipation, or diarrhea.  She continues to have intermittent abdominal pain.  She has no urinary complaints.  Patient otherwise feels well and offers no further specific complaints today.  REVIEW OF SYSTEMS:   Review of Systems  Constitutional: Negative.  Negative for chills, diaphoresis, fever, malaise/fatigue and weight loss.  Respiratory: Negative.  Negative for cough, hemoptysis and shortness of breath.   Cardiovascular: Negative.  Negative for chest pain and leg swelling.  Gastrointestinal: Positive for abdominal pain. Negative for constipation, diarrhea and nausea.  Genitourinary: Negative.  Negative for hematuria.  Musculoskeletal: Negative.   Skin: Negative.  Negative for rash.  Neurological: Negative.  Negative for weakness.  Psychiatric/Behavioral: The patient is nervous/anxious.     As per HPI. Otherwise, a complete review of systems is negative.  PAST MEDICAL HISTORY: Past Medical History:  Diagnosis Date  . Arthritis   . Cirrhosis ([place])   . Diabetes mellitus   . GERD (gastroesophageal reflux disease)   . Hypertension     PAST SURGICAL HISTORY: Past Surgical History:  Procedure Laterality Date  . ABDOMINAL HYSTERECTOMY     2009  . CHOLECYSTECTOMY  1997    . GALLBLADDER SURGERY    . HERNIA REPAIR  3/71/6967   periumbilical with right ooph  . KNEE SURGERY  12/2011   total knee replacement Dr. Duayne Cal  . TUBAL LIGATION      FAMILY HISTORY: Family History  Problem Relation Age of Onset  . Diabetes Sister   . Diabetes Sister   . Diabetes Sister   . Diabetes Mother   . Heart disease Father     ADVANCED DIRECTIVES (Y/N):  N  HEALTH MAINTENANCE: Social History   Tobacco Use  . Smoking status: Never Smoker  . Smokeless tobacco: Never Used  Substance Use Topics  . Alcohol use: No  . Drug use: No     Colonoscopy:  PAP:  Bone density:  Lipid panel:  Allergies  Allergen Reactions  . Amoxicillin-Pot Clavulanate   . Ciprofloxacin   . Clarithromycin     Current Outpatient Medications  Medication Sig Dispense Refill  . aspirin 81 MG tablet Take 1 tablet by mouth daily.    . Blood Glucose Monitoring Suppl (FIFTY50 GLUCOSE METER 2.0) W/DEVICE KIT Use as directed.    . doxycycline (VIBRA-TABS) 100 MG tablet Take 1 tablet (100 mg total) by mouth 2 (two) times daily. 20 tablet 0  . glyBURIDE (DIABETA) 2.5 MG tablet Take 1 tablet by mouth daily.    . insulin glargine (LANTUS) 100 UNIT/ML injection Inject 50 Units into the skin daily.    . Insulin Pen Needle (B-D UF III MINI PEN NEEDLES) 31G X 5 MM MISC Use 1 needle daily with victoza injection    . lisinopril (PRINIVIL,ZESTRIL) 20 MG tablet Take 1 tablet (20 mg total) by mouth daily. [place]  tablet 3  . metFORMIN (GLUMETZA) 1000 MG (MOD) 24 hr tablet Take 1,000 mg by mouth daily with breakfast.      . metoprolol succinate (TOPROL-XL) 25 MG 24 hr tablet Take 1 tablet (25 mg total) by mouth daily. Take with or immediately following a meal. 30 tablet 6  . ursodiol (ACTIGALL) 300 MG capsule Take 300 mg by mouth 2 (two) times daily.    Marland Kitchen atorvastatin (LIPITOR) 80 MG tablet Take 1 tablet (80 mg total) by mouth daily at 6 PM. 30 tablet 12  . famotidine (PEPCID) 40 MG tablet Take 1 tablet (40 mg  total) by mouth every evening. (Patient not taking: Reported on 03/19/2017) 30 tablet 1  . HYDROcodone-acetaminophen (NORCO/VICODIN) 5-325 MG tablet Take 1 tablet by mouth every 4 (four) hours as needed. (Patient not taking: Reported on 03/05/2017) 15 tablet 0  . insulin aspart (NOVOLOG) 100 UNIT/ML injection Take 12 units twice daily before meals    . insulin aspart (NOVOLOG) 100 UNIT/ML injection Inject 15 Units into the skin 3 (three) times daily before meals. (Patient not taking: Reported on 03/05/2017) 10 mL 11  . insulin lispro (HUMALOG) 100 UNIT/ML injection Inject 15 Units into the skin 3 (three) times daily before meals.    . isosorbide mononitrate (IMDUR) 60 MG 24 hr tablet Take 1 tablet (60 mg total) by mouth daily. 30 tablet 6  . JARDIANCE 25 MG TABS tablet Take 25 mg daily by mouth.  6  . MULTIPLE VITAMINS-MINERALS PO Take 1 tablet by mouth daily.    . ondansetron (ZOFRAN) 4 MG tablet Take 1 tablet (4 mg total) by mouth every 8 (eight) hours as needed for nausea or vomiting. (Patient not taking: Reported on 03/05/2017) 10 tablet 0  . pantoprazole (PROTONIX) 40 MG tablet TAKE 1 TABLET BY MOUTH  DAILY 90 tablet 1  . potassium chloride (K-DUR) 10 MEQ tablet Take 1 tablet (10 mEq total) by mouth 2 (two) times daily. (Patient not taking: Reported on 03/19/2017) 60 tablet 0  . terconazole (TERAZOL 7) 0.4 % vaginal cream Place 1 applicator vaginally at bedtime. (Patient not taking: Reported on 03/19/2017) 45 g 0   No current facility-administered medications for this visit.     OBJECTIVE: Vitals:   03/19/17 1155  BP: 133/82  Pulse: 83  Resp: 18  Temp: 98.1 F (36.7 C)     Body mass index is 30.02 kg/m.    ECOG FS:0 - Asymptomatic  General: Well-developed, well-nourished, no acute distress. Eyes: Pink conjunctiva, anicteric sclera. HEENT: Normocephalic, moist mucous membranes, clear oropharnyx. Lungs: Clear to auscultation bilaterally. Heart: Regular rate and rhythm. No rubs,  murmurs, or gallops. Abdomen: Soft, nontender, nondistended. No organomegaly noted, normoactive bowel sounds. Musculoskeletal: No edema, cyanosis, or clubbing. Neuro: Alert, answering all questions appropriately. Cranial nerves grossly intact. Skin: No rashes or petechiae noted. Psych: Normal affect. Lymphatics: No cervical, calvicular, axillary or inguinal LAD.   LAB RESULTS:  Lab Results  Component Value Date   NA 134 (L) 03/03/2017   K 3.0 (L) 03/03/2017   CL 96 (L) 03/03/2017   CO2 26 03/03/2017   GLUCOSE 208 (H) 03/03/2017   BUN 12 03/03/2017   CREATININE 0.65 03/03/2017   CALCIUM 9.0 03/03/2017   PROT 9.1 (H) 03/03/2017   ALBUMIN 3.2 (L) 03/03/2017   AST 87 (H) 03/03/2017   ALT 114 (H) 03/03/2017   ALKPHOS 573 (H) 03/03/2017   BILITOT 0.9 03/03/2017   GFRNONAA >60 03/03/2017   GFRAA >60 03/03/2017  Lab Results  Component Value Date   WBC 5.4 03/19/2017   NEUTROABS 2.7 03/19/2017   HGB 13.1 03/19/2017   HCT 40.2 03/19/2017   MCV 80.5 03/19/2017   PLT 161 03/19/2017     STUDIES: Ct Chest Wo Contrast  Result Date: 03/03/2017 CLINICAL DATA:  Fever, cough. Opacification at the lung bases seen on earlier CT abdomen. Nonspecific lymphadenopathy also described in the lower chest and upper abdomen on recent CT abdomen. EXAM: CT CHEST WITHOUT CONTRAST TECHNIQUE: Multidetector CT imaging of the chest was performed following the standard protocol without IV contrast. COMPARISON:  CT abdomen from earlier same day. Chest CT dated 10/07/2007. FINDINGS: Cardiovascular: Heart size is upper normal. No pericardial effusion. Coronary artery calcifications noted. Mild aortic atherosclerosis. No aortic aneurysm. Mediastinum/Nodes: No mass or enlarged lymph nodes appreciated within the mediastinum or perihilar regions. Borderline enlarged lymph node within the sub-carinal region, measuring 11 mm short axis, was better demonstrated on the earlier contrast-enhanced abdomen CT. Esophagus  is unremarkable. Apparent interval surgical resection of a left thyroid mass/enlargement seen on earlier chest CT of 10/07/2007. Trachea and central bronchi are unremarkable. Lungs/Pleura: Small patchy consolidations within the lower lobes bilaterally, left greater than right, as described on earlier CT abdomen, most compatible with atelectasis. 7 mm nodule within the lingula, as also described on the CT abdomen from earlier same day, suspected additional nodular atelectasis. Lungs otherwise clear.  No pleural effusion or pneumothorax. Upper Abdomen: Limited images of the upper abdomen are provided. Upper abdomen is better characterized on the contrast-enhanced CT abdomen earlier today. No appreciable change in the short-term interval. Musculoskeletal: Mild degenerative change within the thoracic spine. No acute or suspicious osseous finding. IMPRESSION: 1. No additional pathologic appearing adenopathy is identified within the upper mediastinum or perihilar regions. The borderline enlarged lymph node in the sub-carinal region, measuring 11 mm short axis, was better seen on today's earlier contrast-enhanced CT. The lymphadenopathy within the upper abdomen, again better demonstrated on today's earlier contrast-enhanced CT abdomen, is suspicious for neoplastic lymphadenopathy, possibly lymphoma as suggested on the earlier CT report. Consider tissue sampling for definitive characterization. 2. Small patchy consolidations at each lung base, most likely atelectasis. 3. 7 mm nodular density within the lingula is favored to represent additional nodular atelectasis. Non-contrast chest CT at 6-12 months is recommended. If the nodule is stable at time of repeat CT, then future CT at 18-24 months (from today's scan) is considered optional for low-risk patients, but is recommended for high-risk patients. This recommendation follows the consensus statement: Guidelines for Management of Incidental Pulmonary Nodules Detected on CT  Images: From the Fleischner Society 2017; Radiology 2017; 284:228-243. 4. Coronary artery calcifications, particularly dense within the left anterior descending coronary artery. Recommend correlation with any possible associated cardiac symptoms. 5. Aortic atherosclerosis. Electronically Signed   By: Franki Cabot M.D.   On: 03/03/2017 16:09   Ct Abdomen Pelvis W Contrast  Result Date: 03/03/2017 CLINICAL DATA:  Seen at few days ago for cystitis without improvement after finishing antibiotic. Persistent fever and suprapubic pain. EXAM: CT ABDOMEN AND PELVIS WITH CONTRAST TECHNIQUE: Multidetector CT imaging of the abdomen and pelvis was performed using the standard protocol following bolus administration of intravenous contrast. CONTRAST:  182m ISOVUE-300 IOPAMIDOL (ISOVUE-300) INJECTION 61% COMPARISON:  07/29/2010 FINDINGS: Lower chest: 7 x 5 mm nodule over the lingula. Mild opacification over the bilateral lower lobes left greater than right which may be due to atelectasis or infection. Heart is normal size. There is calcified plaque  over the left and descending and right coronary arteries. 1.1 cm right subcarinal lymph node. Couple small subcentimeter right pericardial phrenic lymph nodes. Hepatobiliary: Previous cholecystectomy. Liver and biliary tree are normal. Pancreas: Within normal. Spleen: Within normal. Adrenals/Urinary Tract: Adrenal glands are normal. Kidneys are normal in size without hydronephrosis or nephrolithiasis. Subcentimeter hypodensity over the upper pole cortex of the left kidney likely a cyst but too small to characterize. Ureters and bladder are normal. Stomach/Bowel: Stomach and small bowel are within normal. Appendix is normal. Colon is within normal. Vascular/Lymphatic: Minimal calcified plaque over the distal abdominal aorta. Moderate adenopathy over the upper abdomen in the region of the gastrohepatic ligament and celiac axis with the 1.9 cm node over the celiac axis and 1.9 cm  lymph node in the portacaval region when measured by short axis. Mild periaortic adenopathy with the largest node measuring 1.4 cm by short axis in the left periaortic region. Reproductive: Unremarkable. Other: No free fluid or focal inflammatory change. Evidence of previous right inguinal hernia repair as well as previous left lower quadrant ventral hernia repair. There is recurrent ventral hernia over the left lower quadrant just below the previous repair site with fascial defect measuring 3.1 cm in diameter as this hernia contains only mesenteric fat. Small umbilical hernia containing only mesenteric fat. Musculoskeletal: Mild degenerate change of the spine hips. Mild sclerosis along the sacroiliac joints right worse than left. IMPRESSION: No acute findings in abdomen/pelvis. Heterogeneous opacification over the lung bases left greater than right likely atelectasis, although cannot exclude infection. 6 mm nodule over the lingula. Recommend follow-up CT 4-6 weeks. Nonspecific adenopathy over the lower chest and upper abdomen as described most prominent over the porta hepatis with the largest node measuring 1.9 cm by short axis. No definite primary neoplasm identified, although cannot exclude lymphoma/leukemia. Consider complete chest CT for further evaluation. Bilateral hernia repairs as described. There is a recurrent hernia just inferior to the left lower abdominal hernia repair site containing only peritoneal fat. Tiny umbilical hernia containing only peritoneal fat. Subcentimeter hypodensity over the upper pole left kidney too small to characterize but likely a cyst. Atherosclerotic coronary artery disease. Aortic Atherosclerosis (ICD10-I70.0). Electronically Signed   By: Marin Olp M.D.   On: 03/03/2017 14:45    ASSESSMENT: Lymphadenopathy  PLAN:    1. Lymphadenopathy: CT scan results from March 03, 2017 reviewed independently and reported as above.  Patient noted to have nonspecific adenopathy.   Will get a PET scan to further evaluate.  CBC is within normal limits.  Peripheral blood flow cytometry is pending at time of dictation.  Return to clinic in 1-2 weeks after her PET scan to discuss the results and additional diagnostic planning if necessary. 2.  Microcytosis: Will get hemoglobinopathy profile for completeness.  Approximately 60 minutes was spent in discussion of which greater than 50% was consultation.  Patient expressed understanding and was in agreement with this plan. She also understands that She can call clinic at any time with any questions, concerns, or complaints.   Cancer Staging No matching staging information was found for the patient.  Lloyd Huger, MD   03/20/2017 8:40 AM

## 2017-03-19 ENCOUNTER — Encounter: Payer: Self-pay | Admitting: Oncology

## 2017-03-19 ENCOUNTER — Other Ambulatory Visit: Payer: Self-pay | Admitting: Physician Assistant

## 2017-03-19 ENCOUNTER — Inpatient Hospital Stay: Payer: 59

## 2017-03-19 ENCOUNTER — Inpatient Hospital Stay: Payer: 59 | Attending: Oncology | Admitting: Oncology

## 2017-03-19 DIAGNOSIS — I2510 Atherosclerotic heart disease of native coronary artery without angina pectoris: Secondary | ICD-10-CM | POA: Diagnosis not present

## 2017-03-19 DIAGNOSIS — Z794 Long term (current) use of insulin: Secondary | ICD-10-CM | POA: Diagnosis not present

## 2017-03-19 DIAGNOSIS — K429 Umbilical hernia without obstruction or gangrene: Secondary | ICD-10-CM | POA: Insufficient documentation

## 2017-03-19 DIAGNOSIS — Z79899 Other long term (current) drug therapy: Secondary | ICD-10-CM | POA: Diagnosis not present

## 2017-03-19 DIAGNOSIS — R109 Unspecified abdominal pain: Secondary | ICD-10-CM | POA: Insufficient documentation

## 2017-03-19 DIAGNOSIS — I10 Essential (primary) hypertension: Secondary | ICD-10-CM | POA: Insufficient documentation

## 2017-03-19 DIAGNOSIS — R630 Anorexia: Secondary | ICD-10-CM | POA: Diagnosis not present

## 2017-03-19 DIAGNOSIS — M129 Arthropathy, unspecified: Secondary | ICD-10-CM | POA: Insufficient documentation

## 2017-03-19 DIAGNOSIS — D509 Iron deficiency anemia, unspecified: Secondary | ICD-10-CM | POA: Diagnosis not present

## 2017-03-19 DIAGNOSIS — R110 Nausea: Secondary | ICD-10-CM | POA: Insufficient documentation

## 2017-03-19 DIAGNOSIS — R748 Abnormal levels of other serum enzymes: Secondary | ICD-10-CM | POA: Diagnosis not present

## 2017-03-19 DIAGNOSIS — K219 Gastro-esophageal reflux disease without esophagitis: Secondary | ICD-10-CM | POA: Insufficient documentation

## 2017-03-19 DIAGNOSIS — R591 Generalized enlarged lymph nodes: Secondary | ICD-10-CM

## 2017-03-19 DIAGNOSIS — K432 Incisional hernia without obstruction or gangrene: Secondary | ICD-10-CM | POA: Insufficient documentation

## 2017-03-19 DIAGNOSIS — F419 Anxiety disorder, unspecified: Secondary | ICD-10-CM | POA: Diagnosis not present

## 2017-03-19 DIAGNOSIS — R590 Localized enlarged lymph nodes: Secondary | ICD-10-CM | POA: Insufficient documentation

## 2017-03-19 DIAGNOSIS — R531 Weakness: Secondary | ICD-10-CM | POA: Diagnosis not present

## 2017-03-19 DIAGNOSIS — I700 Atherosclerosis of aorta: Secondary | ICD-10-CM | POA: Insufficient documentation

## 2017-03-19 DIAGNOSIS — E119 Type 2 diabetes mellitus without complications: Secondary | ICD-10-CM | POA: Diagnosis not present

## 2017-03-19 DIAGNOSIS — Z7982 Long term (current) use of aspirin: Secondary | ICD-10-CM | POA: Diagnosis not present

## 2017-03-19 DIAGNOSIS — K7460 Unspecified cirrhosis of liver: Secondary | ICD-10-CM | POA: Insufficient documentation

## 2017-03-19 DIAGNOSIS — R509 Fever, unspecified: Secondary | ICD-10-CM | POA: Diagnosis not present

## 2017-03-19 LAB — CBC WITH DIFFERENTIAL/PLATELET
Basophils Absolute: 0 10*3/uL (ref 0–0.1)
Basophils Relative: 1 %
Eosinophils Absolute: 0.2 10*3/uL (ref 0–0.7)
Eosinophils Relative: 3 %
HCT: 40.2 % (ref 35.0–47.0)
Hemoglobin: 13.1 g/dL (ref 12.0–16.0)
Lymphocytes Relative: 34 %
Lymphs Abs: 1.8 10*3/uL (ref 1.0–3.6)
MCH: 26.2 pg (ref 26.0–34.0)
MCHC: 32.6 g/dL (ref 32.0–36.0)
MCV: 80.5 fL (ref 80.0–100.0)
Monocytes Absolute: 0.6 10*3/uL (ref 0.2–0.9)
Monocytes Relative: 12 %
Neutro Abs: 2.7 10*3/uL (ref 1.4–6.5)
Neutrophils Relative %: 50 %
Platelets: 161 10*3/uL (ref 150–440)
RBC: 4.99 MIL/uL (ref 3.80–5.20)
RDW: 14.5 % (ref 11.5–14.5)
WBC: 5.4 10*3/uL (ref 3.6–11.0)

## 2017-03-19 NOTE — Progress Notes (Signed)
New patient in with husband.  Pt with flat affect, had medication with her which was verified with med list.  Pt misunderstood and thought she was having a "procedure today"  Pt diabetic and has not eaten or drank anything today. RN gave pt diet soda (per request) and peanut butter crackers.

## 2017-03-21 ENCOUNTER — Other Ambulatory Visit: Payer: Self-pay | Admitting: Oncology

## 2017-03-21 ENCOUNTER — Encounter
Admission: RE | Admit: 2017-03-21 | Discharge: 2017-03-21 | Disposition: A | Discharge status: Home / Self Care | Payer: 59 | Source: Ambulatory Visit | Attending: Oncology | Admitting: Oncology

## 2017-03-21 ENCOUNTER — Telehealth: Payer: Self-pay | Admitting: *Deleted

## 2017-03-21 DIAGNOSIS — R591 Generalized enlarged lymph nodes: Secondary | ICD-10-CM | POA: Insufficient documentation

## 2017-03-21 DIAGNOSIS — K4090 Unilateral inguinal hernia, without obstruction or gangrene, not specified as recurrent: Secondary | ICD-10-CM | POA: Diagnosis not present

## 2017-03-21 LAB — HEMOGLOBINOPATHY EVALUATION
Hgb A2 Quant: 2.4 % (ref 1.8–3.2)
Hgb A: 97.6 % (ref 96.4–98.8)
Hgb C: 0 %
Hgb F Quant: 0 % (ref 0.0–2.0)
Hgb S Quant: 0 %
Hgb Variant: 0 %

## 2017-03-21 LAB — GLUCOSE, CAPILLARY: Glucose-Capillary: 97 mg/dL (ref 65–99)

## 2017-03-21 MED ORDER — FLUDEOXYGLUCOSE F - 18 (FDG) INJECTION
12.0000 | Freq: Once | INTRAVENOUS | Status: AC | PRN
  Administered 2017-03-21: 12.4 via INTRAVENOUS

## 2017-03-21 NOTE — Telephone Encounter (Signed)
[facility] called asking for diagnosed code for Flow cytometry  test ordered for patient Reference # [id number] when you call them back

## 2017-03-21 NOTE — Telephone Encounter (Signed)
Returned call and diagnosed code given

## 2017-03-21 NOTE — Progress Notes (Signed)
[name]  [phone] [phone]  ID: Lisa [name] OB: [birth year]  MR#: [id number]  [id number]#:[id number]  Patient Care Team: Rubye Beach as PCP - General (Family Medicine)  CHIEF COMPLAINT: Lymphadenopathy.  INTERVAL HISTORY: Patient returns to clinic today for further evaluation, discussion of her imaging results, diagnostic planning. Her performance status appears to be declining.  She reports persistent weakness and fatigue, poor appetite, intermittent fevers, and persistent nausea. She is highly anxious.  She has no neurologic complaints.  She has no chest pain or shortness of breath.  She denies any constipation or diarrhea.  She continues to have intermittent abdominal pain.  She has no urinary complaints.  Patient feels generally terrible, but offers no further specific complaints today.  REVIEW OF SYSTEMS:   Review of Systems  Constitutional: Positive for fever and malaise/fatigue. Negative for chills, diaphoresis and weight loss.  Respiratory: Negative.  Negative for cough, hemoptysis and shortness of breath.   Cardiovascular: Negative.  Negative for chest pain and leg swelling.  Gastrointestinal: Positive for abdominal pain, nausea and vomiting. Negative for constipation and diarrhea.  Genitourinary: Negative.  Negative for hematuria.  Musculoskeletal: Negative.   Skin: Negative.  Negative for rash.  Neurological: Positive for weakness.  Psychiatric/Behavioral: The patient is nervous/anxious.     As per HPI. Otherwise, a complete review of systems is negative.  PAST MEDICAL HISTORY: Past Medical History:  Diagnosis Date  . Arthritis   . Cirrhosis ([place])   . Diabetes mellitus   . GERD (gastroesophageal reflux disease)   . Hypertension     PAST SURGICAL HISTORY: Past Surgical History:  Procedure Laterality Date  . ABDOMINAL HYSTERECTOMY     2009  . CHOLECYSTECTOMY  1997  . Coronary Stent Intervention N/A  09/13/2016   Performed by Yolonda Kida, MD at [facility]  . GALLBLADDER SURGERY    . HERNIA REPAIR  0/16/0109   periumbilical with right ooph  . KNEE SURGERY  12/2011   total knee replacement Dr. Duayne Cal  . Left Heart Cath and Coronary Angiography N/A 09/13/2016   Performed by Yolonda Kida, MD at [facility]  . TUBAL LIGATION      FAMILY HISTORY: Family History  Problem Relation Age of Onset  . Diabetes Sister   . Diabetes Sister   . Diabetes Sister   . Diabetes Mother   . Heart disease Father     ADVANCED DIRECTIVES (Y/N):  N  HEALTH MAINTENANCE: Social History   Tobacco Use  . Smoking status: Never Smoker  . Smokeless tobacco: Never Used  Substance Use Topics  . Alcohol use: No  . Drug use: No     Colonoscopy:  PAP:  Bone density:  Lipid panel:  Allergies  Allergen Reactions  . Amoxicillin-Pot Clavulanate   . Ciprofloxacin   . Clarithromycin     Current Outpatient Medications  Medication Sig Dispense Refill  . aspirin 81 MG tablet Take 1 tablet by mouth daily.    . Blood Glucose Monitoring Suppl (FIFTY50 GLUCOSE METER 2.0) W/DEVICE KIT Use as directed.    . doxycycline (VIBRA-TABS) 100 MG tablet Take 1 tablet (100 mg total) by mouth 2 (two) times daily. 20 tablet 0  . insulin aspart (NOVOLOG) 100 UNIT/ML injection Take 12 units twice daily before meals    . insulin glargine (LANTUS) 100 UNIT/ML injection Inject 50 Units into the skin daily.    . insulin lispro (HUMALOG) 100 UNIT/ML injection Inject 15  Units into the skin 3 (three) times daily before meals.    . Insulin Pen Needle (B-D UF III MINI PEN NEEDLES) 31G X 5 MM MISC Use 1 needle daily with victoza injection    . JARDIANCE 25 MG TABS tablet Take 25 mg daily by mouth.  6  . lisinopril (PRINIVIL,ZESTRIL) 20 MG tablet Take 1 tablet (20 mg total) by mouth daily. 90 tablet 3  . metFORMIN (GLUMETZA) 1000 MG (MOD) 24 hr tablet Take 1,000 mg by mouth daily with breakfast.      .  metoprolol succinate (TOPROL-XL) 25 MG 24 hr tablet Take 1 tablet (25 mg total) by mouth daily. Take with or immediately following a meal. 30 tablet 6  . MULTIPLE VITAMINS-MINERALS PO Take 1 tablet by mouth daily.    . pantoprazole (PROTONIX) 40 MG tablet TAKE 1 TABLET BY MOUTH  DAILY 90 tablet 1  . ursodiol (ACTIGALL) 300 MG capsule Take 300 mg by mouth 2 (two) times daily.    Marland Kitchen atorvastatin (LIPITOR) 80 MG tablet Take 1 tablet (80 mg total) by mouth daily at 6 PM. 30 tablet 12  . famotidine (PEPCID) 40 MG tablet Take 1 tablet (40 mg total) by mouth every evening. (Patient not taking: Reported on 03/19/2017) 30 tablet 1  . glyBURIDE (DIABETA) 2.5 MG tablet Take 1 tablet by mouth daily.    Marland Kitchen HYDROcodone-acetaminophen (NORCO/VICODIN) 5-325 MG tablet Take 1 tablet by mouth every 4 (four) hours as needed. (Patient not taking: Reported on 03/05/2017) 15 tablet 0  . insulin aspart (NOVOLOG) 100 UNIT/ML injection Inject 15 Units into the skin 3 (three) times daily before meals. (Patient not taking: Reported on 03/05/2017) 10 mL 11  . isosorbide mononitrate (IMDUR) 60 MG 24 hr tablet Take 1 tablet (60 mg total) by mouth daily. 30 tablet 6  . ondansetron (ZOFRAN) 4 MG tablet Take 1 tablet (4 mg total) by mouth every 8 (eight) hours as needed for nausea or vomiting. (Patient not taking: Reported on 03/05/2017) 10 tablet 0  . potassium chloride (K-DUR) 10 MEQ tablet Take 1 tablet (10 mEq total) by mouth 2 (two) times daily. (Patient not taking: Reported on 03/19/2017) 60 tablet 0  . prochlorperazine (COMPAZINE) 10 MG tablet Take 1 tablet (10 mg total) every 6 (six) hours as needed by mouth for nausea or vomiting. 30 tablet 1  . terconazole (TERAZOL 7) 0.4 % vaginal cream Place 1 applicator vaginally at bedtime. (Patient not taking: Reported on 03/19/2017) 45 g 0   No current facility-administered medications for this visit.     OBJECTIVE: Vitals:   03/23/17 1111  BP: 131/78  Pulse: 97  Resp: 20  Temp:  (!) 100.7 F (38.2 C)     Body mass index is 29.5 kg/m.    ECOG FS:0 - Asymptomatic  General: Well-developed, well-nourished, no acute distress. Eyes: Pink conjunctiva, anicteric sclera. HEENT: Normocephalic, moist mucous membranes, clear oropharnyx. Lungs: Clear to auscultation bilaterally. Heart: Regular rate and rhythm. No rubs, murmurs, or gallops. Abdomen: Soft, nontender, nondistended. No organomegaly noted, normoactive bowel sounds. Musculoskeletal: No edema, cyanosis, or clubbing. Neuro: Alert, answering all questions appropriately. Cranial nerves grossly intact. Skin: No rashes or petechiae noted. Psych: Normal affect. Lymphatics: No palpable cervical, calvicular, axillary or inguinal LAD.   LAB RESULTS:  Lab Results  Component Value Date   NA 134 (L) 03/03/2017   K 3.0 (L) 03/03/2017   CL 96 (L) 03/03/2017   CO2 26 03/03/2017   GLUCOSE 208 (H) 03/03/2017  BUN 12 03/03/2017   CREATININE 0.65 03/03/2017   CALCIUM 9.0 03/03/2017   PROT 9.1 (H) 03/03/2017   ALBUMIN 3.2 (L) 03/03/2017   AST 87 (H) 03/03/2017   ALT 114 (H) 03/03/2017   ALKPHOS 573 (H) 03/03/2017   BILITOT 0.9 03/03/2017   GFRNONAA >60 03/03/2017   GFRAA >60 03/03/2017    Lab Results  Component Value Date   WBC 5.4 03/19/2017   NEUTROABS 2.7 03/19/2017   HGB 13.1 03/19/2017   HCT 40.2 03/19/2017   MCV 80.5 03/19/2017   PLT 161 03/19/2017     STUDIES: Ct Chest Wo Contrast  Result Date: 03/03/2017 CLINICAL DATA:  Fever, cough. Opacification at the lung bases seen on earlier CT abdomen. Nonspecific lymphadenopathy also described in the lower chest and upper abdomen on recent CT abdomen. EXAM: CT CHEST WITHOUT CONTRAST TECHNIQUE: Multidetector CT imaging of the chest was performed following the standard protocol without IV contrast. COMPARISON:  CT abdomen from earlier same day. Chest CT dated 10/07/2007. FINDINGS: Cardiovascular: Heart size is upper normal. No pericardial effusion. Coronary  artery calcifications noted. Mild aortic atherosclerosis. No aortic aneurysm. Mediastinum/Nodes: No mass or enlarged lymph nodes appreciated within the mediastinum or perihilar regions. Borderline enlarged lymph node within the sub-carinal region, measuring 11 mm short axis, was better demonstrated on the earlier contrast-enhanced abdomen CT. Esophagus is unremarkable. Apparent interval surgical resection of a left thyroid mass/enlargement seen on earlier chest CT of 10/07/2007. Trachea and central bronchi are unremarkable. Lungs/Pleura: Small patchy consolidations within the lower lobes bilaterally, left greater than right, as described on earlier CT abdomen, most compatible with atelectasis. 7 mm nodule within the lingula, as also described on the CT abdomen from earlier same day, suspected additional nodular atelectasis. Lungs otherwise clear.  No pleural effusion or pneumothorax. Upper Abdomen: Limited images of the upper abdomen are provided. Upper abdomen is better characterized on the contrast-enhanced CT abdomen earlier today. No appreciable change in the short-term interval. Musculoskeletal: Mild degenerative change within the thoracic spine. No acute or suspicious osseous finding. IMPRESSION: 1. No additional pathologic appearing adenopathy is identified within the upper mediastinum or perihilar regions. The borderline enlarged lymph node in the sub-carinal region, measuring 11 mm short axis, was better seen on today's earlier contrast-enhanced CT. The lymphadenopathy within the upper abdomen, again better demonstrated on today's earlier contrast-enhanced CT abdomen, is suspicious for neoplastic lymphadenopathy, possibly lymphoma as suggested on the earlier CT report. Consider tissue sampling for definitive characterization. 2. Small patchy consolidations at each lung base, most likely atelectasis. 3. 7 mm nodular density within the lingula is favored to represent additional nodular atelectasis.  Non-contrast chest CT at 6-12 months is recommended. If the nodule is stable at time of repeat CT, then future CT at 18-24 months (from today's scan) is considered optional for low-risk patients, but is recommended for high-risk patients. This recommendation follows the consensus statement: Guidelines for Management of Incidental Pulmonary Nodules Detected on CT Images: From the Fleischner Society 2017; Radiology 2017; 284:228-243. 4. Coronary artery calcifications, particularly dense within the left anterior descending coronary artery. Recommend correlation with any possible associated cardiac symptoms. 5. Aortic atherosclerosis. Electronically Signed   By: Franki Cabot M.D.   On: 03/03/2017 16:09   Ct Abdomen Pelvis W Contrast  Result Date: 03/03/2017 CLINICAL DATA:  Seen at few days ago for cystitis without improvement after finishing antibiotic. Persistent fever and suprapubic pain. EXAM: CT ABDOMEN AND PELVIS WITH CONTRAST TECHNIQUE: Multidetector CT imaging of the abdomen and pelvis  was performed using the standard protocol following bolus administration of intravenous contrast. CONTRAST:  183m ISOVUE-300 IOPAMIDOL (ISOVUE-300) INJECTION 61% COMPARISON:  07/29/2010 FINDINGS: Lower chest: 7 x 5 mm nodule over the lingula. Mild opacification over the bilateral lower lobes left greater than right which may be due to atelectasis or infection. Heart is normal size. There is calcified plaque over the left and descending and right coronary arteries. 1.1 cm right subcarinal lymph node. Couple small subcentimeter right pericardial phrenic lymph nodes. Hepatobiliary: Previous cholecystectomy. Liver and biliary tree are normal. Pancreas: Within normal. Spleen: Within normal. Adrenals/Urinary Tract: Adrenal glands are normal. Kidneys are normal in size without hydronephrosis or nephrolithiasis. Subcentimeter hypodensity over the upper pole cortex of the left kidney likely a cyst but too small to characterize.  Ureters and bladder are normal. Stomach/Bowel: Stomach and small bowel are within normal. Appendix is normal. Colon is within normal. Vascular/Lymphatic: Minimal calcified plaque over the distal abdominal aorta. Moderate adenopathy over the upper abdomen in the region of the gastrohepatic ligament and celiac axis with the 1.9 cm node over the celiac axis and 1.9 cm lymph node in the portacaval region when measured by short axis. Mild periaortic adenopathy with the largest node measuring 1.4 cm by short axis in the left periaortic region. Reproductive: Unremarkable. Other: No free fluid or focal inflammatory change. Evidence of previous right inguinal hernia repair as well as previous left lower quadrant ventral hernia repair. There is recurrent ventral hernia over the left lower quadrant just below the previous repair site with fascial defect measuring 3.1 cm in diameter as this hernia contains only mesenteric fat. Small umbilical hernia containing only mesenteric fat. Musculoskeletal: Mild degenerate change of the spine hips. Mild sclerosis along the sacroiliac joints right worse than left. IMPRESSION: No acute findings in abdomen/pelvis. Heterogeneous opacification over the lung bases left greater than right likely atelectasis, although cannot exclude infection. 6 mm nodule over the lingula. Recommend follow-up CT 4-6 weeks. Nonspecific adenopathy over the lower chest and upper abdomen as described most prominent over the porta hepatis with the largest node measuring 1.9 cm by short axis. No definite primary neoplasm identified, although cannot exclude lymphoma/leukemia. Consider complete chest CT for further evaluation. Bilateral hernia repairs as described. There is a recurrent hernia just inferior to the left lower abdominal hernia repair site containing only peritoneal fat. Tiny umbilical hernia containing only peritoneal fat. Subcentimeter hypodensity over the upper pole left kidney too small to characterize  but likely a cyst. Atherosclerotic coronary artery disease. Aortic Atherosclerosis (ICD10-I70.0). Electronically Signed   By: DMarin OlpM.D.   On: 03/03/2017 14:45   Nm Pet Image Initial (pi) Skull Base To Thigh  Result Date: 03/21/2017 CLINICAL DATA:  Initial treatment strategy for lymphadenopathy. Abdominal pain and nausea. EXAM: NUCLEAR MEDICINE PET SKULL BASE TO THIGH TECHNIQUE: Twelve point for mCi F-18 FDG was injected intravenously. Full-ring PET imaging was performed from the skull base to thigh after the radiotracer. CT data was obtained and used for attenuation correction and anatomic localization. FASTING BLOOD GLUCOSE:  Value: 97 mg/dl COMPARISON:  CT chest abdomen pelvis 03/03/2017 and MR abdomen 01/12/2016. FINDINGS: NECK: Hypermetabolic bilateral level 2 lymph nodes measure up to 10 mm on the right, with an SUV max of 12.2. CT images show no acute findings. CHEST: No hypermetabolic mediastinal, hilar or axillary lymph nodes. No hypermetabolic pulmonary nodules. Atherosclerotic calcification of the arterial vasculature, including three-vessel involvement of the coronary arteries. No pericardial or pleural effusion. Lungs are grossly clear.  ABDOMEN/PELVIS: No abnormal hypermetabolism in the liver, adrenal glands, spleen or pancreas. There is hypermetabolic abdominal retroperitoneal and abdominal peritoneal ligament adenopathy. Index conglomerate porta hepatis adenopathy has an SUV max of 18.3 and is better measured on 03/03/2017. Index aortocaval lymph node measures approximately 1.4 cm (CT image 170) with an SUV max of 11.4. No hypermetabolic adenopathy in the pelvis. Liver, adrenal glands, kidneys, spleen, pancreas, stomach and bowel are otherwise unremarkable. Bilateral inguinal hernia repair with a large fat containing left inguinal hernia. SKELETON: No abnormal osseous hypermetabolism. IMPRESSION: 1. Hypermetabolic adenopathy in the neck and abdomen, most indicative of lymphoma. 2. Aortic  atherosclerosis (ICD10-170.0). Three-vessel coronary artery calcification. 3. Bilateral inguinal hernia repairs with a large fat containing left inguinal hernia. Electronically Signed   By: Lorin Picket M.D.   On: 03/21/2017 13:39    ASSESSMENT: Lymphadenopathy  PLAN:    1. Lymphadenopathy: PET scan results from March 21, 2017 reviewed independently and reported as above with lymphadenopathy above and below the diaphragm suspicious for lymphoma.  Proceed with CT-guided biopsy as well as bone marrow biopsy at the same time to obtain a diagnosis and complete the staging workup. Patient will return to clinic in approximately 2 weeks to discuss the results and any treatment planning if necessary. 2.  Nausea/vomiting: Unclear etiology.  Does not appear related to underlying pathology.  Patient was given a prescription for Compazine. 3.  Fevers: No obvious infection, monitor.  Patient was instructed to ibuprofen sparingly. 4.  Microcytosis: Hemoglobinopathy profile was normal. 5.  Elevated liver enzymes: Chronic, monitor.  Approximately 30 minutes was spent in discussion of which greater than 50% was consultation.  Patient expressed understanding and was in agreement with this plan. She also understands that She can call clinic at any time with any questions, concerns, or complaints.   Cancer Staging No matching staging information was found for the patient.  Lloyd Huger, MD   03/24/2017 3:26 PM

## 2017-03-21 NOTE — Telephone Encounter (Signed)
Lymphadenopathy.

## 2017-03-22 LAB — COMP PANEL: LEUKEMIA/LYMPHOMA

## 2017-03-23 ENCOUNTER — Other Ambulatory Visit: Payer: Self-pay

## 2017-03-23 ENCOUNTER — Inpatient Hospital Stay (HOSPITAL_BASED_OUTPATIENT_CLINIC_OR_DEPARTMENT_OTHER): Payer: 59 | Admitting: Oncology

## 2017-03-23 VITALS — BP 131/78 | HR 97 | Temp 100.7°F | Resp 20 | Wt 199.7 lb

## 2017-03-23 DIAGNOSIS — R509 Fever, unspecified: Secondary | ICD-10-CM

## 2017-03-23 DIAGNOSIS — K7460 Unspecified cirrhosis of liver: Secondary | ICD-10-CM | POA: Diagnosis not present

## 2017-03-23 DIAGNOSIS — R531 Weakness: Secondary | ICD-10-CM | POA: Diagnosis not present

## 2017-03-23 DIAGNOSIS — K429 Umbilical hernia without obstruction or gangrene: Secondary | ICD-10-CM

## 2017-03-23 DIAGNOSIS — R591 Generalized enlarged lymph nodes: Secondary | ICD-10-CM

## 2017-03-23 DIAGNOSIS — M129 Arthropathy, unspecified: Secondary | ICD-10-CM

## 2017-03-23 DIAGNOSIS — D509 Iron deficiency anemia, unspecified: Secondary | ICD-10-CM

## 2017-03-23 DIAGNOSIS — Z794 Long term (current) use of insulin: Secondary | ICD-10-CM

## 2017-03-23 DIAGNOSIS — K219 Gastro-esophageal reflux disease without esophagitis: Secondary | ICD-10-CM

## 2017-03-23 DIAGNOSIS — I700 Atherosclerosis of aorta: Secondary | ICD-10-CM

## 2017-03-23 DIAGNOSIS — F419 Anxiety disorder, unspecified: Secondary | ICD-10-CM | POA: Diagnosis not present

## 2017-03-23 DIAGNOSIS — R110 Nausea: Secondary | ICD-10-CM | POA: Diagnosis not present

## 2017-03-23 DIAGNOSIS — K432 Incisional hernia without obstruction or gangrene: Secondary | ICD-10-CM

## 2017-03-23 DIAGNOSIS — R748 Abnormal levels of other serum enzymes: Secondary | ICD-10-CM | POA: Diagnosis not present

## 2017-03-23 DIAGNOSIS — Z79899 Other long term (current) drug therapy: Secondary | ICD-10-CM

## 2017-03-23 DIAGNOSIS — E119 Type 2 diabetes mellitus without complications: Secondary | ICD-10-CM | POA: Diagnosis not present

## 2017-03-23 DIAGNOSIS — I2510 Atherosclerotic heart disease of native coronary artery without angina pectoris: Secondary | ICD-10-CM

## 2017-03-23 DIAGNOSIS — R630 Anorexia: Secondary | ICD-10-CM

## 2017-03-23 DIAGNOSIS — Z7982 Long term (current) use of aspirin: Secondary | ICD-10-CM

## 2017-03-23 DIAGNOSIS — R109 Unspecified abdominal pain: Secondary | ICD-10-CM | POA: Diagnosis not present

## 2017-03-23 DIAGNOSIS — I10 Essential (primary) hypertension: Secondary | ICD-10-CM

## 2017-03-23 DIAGNOSIS — R590 Localized enlarged lymph nodes: Secondary | ICD-10-CM | POA: Diagnosis not present

## 2017-03-23 MED ORDER — PROCHLORPERAZINE MALEATE 10 MG PO TABS: 10.0000 mg | ORAL_TABLET | Freq: Four times a day (QID) | ORAL | 1 refills | Status: DC | PRN

## 2017-03-23 NOTE — Progress Notes (Signed)
Patient here today for follow up and PET results. Patient reports continued nausea, weakness today.

## 2017-04-04 ENCOUNTER — Other Ambulatory Visit: Payer: Self-pay | Admitting: Student

## 2017-04-05 ENCOUNTER — Other Ambulatory Visit (HOSPITAL_COMMUNITY)
Admission: RE | Admit: 2017-04-05 | Discharge: 2017-04-05 | Disposition: A | Discharge status: Home / Self Care | Payer: 59 | Source: Ambulatory Visit | Attending: Oncology | Admitting: Oncology

## 2017-04-05 ENCOUNTER — Other Ambulatory Visit: Payer: Self-pay | Admitting: Physician Assistant

## 2017-04-05 ENCOUNTER — Ambulatory Visit
Admission: RE | Admit: 2017-04-05 | Discharge: 2017-04-05 | Disposition: A | Discharge status: Home / Self Care | Payer: 59 | Source: Ambulatory Visit | Attending: Oncology | Admitting: Oncology

## 2017-04-05 DIAGNOSIS — R591 Generalized enlarged lymph nodes: Secondary | ICD-10-CM

## 2017-04-05 DIAGNOSIS — R590 Localized enlarged lymph nodes: Secondary | ICD-10-CM | POA: Insufficient documentation

## 2017-04-05 DIAGNOSIS — E876 Hypokalemia: Secondary | ICD-10-CM

## 2017-04-05 LAB — PROTIME-INR
INR: 0.92
Prothrombin Time: 12.3 seconds (ref 11.4–15.2)

## 2017-04-05 LAB — CBC
HCT: 35.1 % (ref 35.0–47.0)
Hemoglobin: 11.2 g/dL — ABNORMAL LOW (ref 12.0–16.0)
MCH: 25.4 pg — ABNORMAL LOW (ref 26.0–34.0)
MCHC: 32 g/dL (ref 32.0–36.0)
MCV: 79.3 fL — ABNORMAL LOW (ref 80.0–100.0)
Platelets: 171 10*3/uL (ref 150–440)
RBC: 4.43 MIL/uL (ref 3.80–5.20)
RDW: 14.4 % (ref 11.5–14.5)
WBC: 5.4 10*3/uL (ref 3.6–11.0)

## 2017-04-05 LAB — APTT: aPTT: 34 seconds (ref 24–36)

## 2017-04-05 LAB — GLUCOSE, CAPILLARY: Glucose-Capillary: 177 mg/dL — ABNORMAL HIGH (ref 65–99)

## 2017-04-05 MED ORDER — HEPARIN SOD (PORK) LOCK FLUSH 100 UNIT/ML IV SOLN
INTRAVENOUS | Status: AC
  Filled 2017-04-05: qty 5

## 2017-04-05 MED ORDER — FENTANYL CITRATE (PF) 100 MCG/2ML IJ SOLN
INTRAMUSCULAR | Status: AC
  Filled 2017-04-05: qty 4

## 2017-04-05 MED ORDER — MIDAZOLAM HCL 5 MG/5ML IJ SOLN
INTRAMUSCULAR | Status: AC
  Filled 2017-04-05: qty 5

## 2017-04-05 MED ORDER — SODIUM CHLORIDE 0.9 % IV SOLN: INTRAVENOUS | Status: DC

## 2017-04-05 NOTE — Progress Notes (Signed)
Patient rescheduled for biopsies d/t being on Plavix-see MD note.

## 2017-04-09 ENCOUNTER — Ambulatory Visit: Payer: 59

## 2017-04-09 ENCOUNTER — Ambulatory Visit
Admission: RE | Admit: 2017-04-09 | Discharge: 2017-04-09 | Disposition: A | Discharge status: Home / Self Care | Payer: 59 | Source: Ambulatory Visit | Attending: Oncology | Admitting: Oncology

## 2017-04-09 ENCOUNTER — Other Ambulatory Visit (HOSPITAL_COMMUNITY)
Admission: RE | Admit: 2017-04-09 | Discharge: 2017-04-09 | Disposition: A | Discharge status: Home / Self Care | Payer: 59 | Source: Ambulatory Visit | Attending: Oncology | Admitting: Oncology

## 2017-04-09 DIAGNOSIS — R591 Generalized enlarged lymph nodes: Secondary | ICD-10-CM | POA: Insufficient documentation

## 2017-04-09 DIAGNOSIS — R634 Abnormal weight loss: Secondary | ICD-10-CM | POA: Insufficient documentation

## 2017-04-09 DIAGNOSIS — K4020 Bilateral inguinal hernia, without obstruction or gangrene, not specified as recurrent: Secondary | ICD-10-CM | POA: Insufficient documentation

## 2017-04-09 DIAGNOSIS — D509 Iron deficiency anemia, unspecified: Secondary | ICD-10-CM | POA: Diagnosis not present

## 2017-04-09 DIAGNOSIS — Z794 Long term (current) use of insulin: Secondary | ICD-10-CM | POA: Diagnosis not present

## 2017-04-09 DIAGNOSIS — Z955 Presence of coronary angioplasty implant and graft: Secondary | ICD-10-CM | POA: Insufficient documentation

## 2017-04-09 DIAGNOSIS — E119 Type 2 diabetes mellitus without complications: Secondary | ICD-10-CM | POA: Diagnosis not present

## 2017-04-09 DIAGNOSIS — Z7902 Long term (current) use of antithrombotics/antiplatelets: Secondary | ICD-10-CM | POA: Insufficient documentation

## 2017-04-09 DIAGNOSIS — I10 Essential (primary) hypertension: Secondary | ICD-10-CM | POA: Diagnosis not present

## 2017-04-09 DIAGNOSIS — K7460 Unspecified cirrhosis of liver: Secondary | ICD-10-CM | POA: Insufficient documentation

## 2017-04-09 DIAGNOSIS — Z792 Long term (current) use of antibiotics: Secondary | ICD-10-CM | POA: Insufficient documentation

## 2017-04-09 DIAGNOSIS — Z7982 Long term (current) use of aspirin: Secondary | ICD-10-CM | POA: Diagnosis not present

## 2017-04-09 DIAGNOSIS — K219 Gastro-esophageal reflux disease without esophagitis: Secondary | ICD-10-CM | POA: Insufficient documentation

## 2017-04-09 DIAGNOSIS — Z79899 Other long term (current) drug therapy: Secondary | ICD-10-CM | POA: Insufficient documentation

## 2017-04-09 DIAGNOSIS — Z96659 Presence of unspecified artificial knee joint: Secondary | ICD-10-CM | POA: Insufficient documentation

## 2017-04-09 DIAGNOSIS — D72822 Plasmacytosis: Secondary | ICD-10-CM | POA: Diagnosis not present

## 2017-04-09 DIAGNOSIS — Z880 Allergy status to penicillin: Secondary | ICD-10-CM | POA: Insufficient documentation

## 2017-04-09 DIAGNOSIS — Z9049 Acquired absence of other specified parts of digestive tract: Secondary | ICD-10-CM | POA: Insufficient documentation

## 2017-04-09 DIAGNOSIS — R590 Localized enlarged lymph nodes: Secondary | ICD-10-CM | POA: Diagnosis not present

## 2017-04-09 DIAGNOSIS — R599 Enlarged lymph nodes, unspecified: Secondary | ICD-10-CM | POA: Diagnosis not present

## 2017-04-09 LAB — PROTIME-INR
INR: 0.96
Prothrombin Time: 12.7 s (ref 11.4–15.2)

## 2017-04-09 LAB — CBC WITH DIFFERENTIAL/PLATELET
Basophils Absolute: 0.1 10*3/uL (ref 0–0.1)
Basophils Relative: 1 %
Eosinophils Absolute: 0.1 10*3/uL (ref 0–0.7)
Eosinophils Relative: 1 %
HCT: 36.8 % (ref 35.0–47.0)
Hemoglobin: 11.7 g/dL — ABNORMAL LOW (ref 12.0–16.0)
Lymphocytes Relative: 16 %
Lymphs Abs: 1.3 10*3/uL (ref 1.0–3.6)
MCH: 25.3 pg — ABNORMAL LOW (ref 26.0–34.0)
MCHC: 31.9 g/dL — ABNORMAL LOW (ref 32.0–36.0)
MCV: 79.3 fL — ABNORMAL LOW (ref 80.0–100.0)
Monocytes Absolute: 0.5 10*3/uL (ref 0.2–0.9)
Monocytes Relative: 7 %
Neutro Abs: 5.8 10*3/uL (ref 1.4–6.5)
Neutrophils Relative %: 75 %
Platelets: 189 10*3/uL (ref 150–440)
RBC: 4.64 MIL/uL (ref 3.80–5.20)
RDW: 14.3 % (ref 11.5–14.5)
WBC: 7.7 10*3/uL (ref 3.6–11.0)

## 2017-04-09 LAB — GLUCOSE, CAPILLARY: Glucose-Capillary: 150 mg/dL — ABNORMAL HIGH (ref 65–99)

## 2017-04-09 MED ORDER — MIDAZOLAM HCL 5 MG/5ML IJ SOLN
INTRAMUSCULAR | Status: AC
  Filled 2017-04-09: qty 5

## 2017-04-09 MED ORDER — FENTANYL CITRATE (PF) 100 MCG/2ML IJ SOLN
INTRAMUSCULAR | Status: AC
  Filled 2017-04-09: qty 4

## 2017-04-09 MED ORDER — HEPARIN SOD (PORK) LOCK FLUSH 100 UNIT/ML IV SOLN
INTRAVENOUS | Status: AC
  Filled 2017-04-09: qty 5

## 2017-04-09 MED ORDER — MIDAZOLAM HCL 5 MG/5ML IJ SOLN
INTRAMUSCULAR | Status: AC | PRN
  Administered 2017-04-09 (×3): 1 mg via INTRAVENOUS

## 2017-04-09 MED ORDER — MIDAZOLAM HCL 5 MG/5ML IJ SOLN
INTRAMUSCULAR | Status: AC | PRN
  Administered 2017-04-09 (×2): 0.5 mg via INTRAVENOUS

## 2017-04-09 MED ORDER — SODIUM CHLORIDE 0.9 % IV SOLN
INTRAVENOUS | Status: DC
  Administered 2017-04-09: 10:00:00 via INTRAVENOUS

## 2017-04-09 MED ORDER — FENTANYL CITRATE (PF) 100 MCG/2ML IJ SOLN
INTRAMUSCULAR | Status: AC | PRN
  Administered 2017-04-09: 25 ug via INTRAVENOUS
  Administered 2017-04-09: 50 ug via INTRAVENOUS
  Administered 2017-04-09 (×2): 25 ug via INTRAVENOUS

## 2017-04-09 MED ORDER — LIDOCAINE HCL (PF) 1 % IJ SOLN
INTRAMUSCULAR | Status: AC | PRN
  Administered 2017-04-09: 10 mL

## 2017-04-09 MED ORDER — FENTANYL CITRATE (PF) 100 MCG/2ML IJ SOLN
INTRAMUSCULAR | Status: AC | PRN
  Administered 2017-04-09: 25 ug via INTRAVENOUS

## 2017-04-09 MED ORDER — OXYCODONE HCL 5 MG PO TABS
5.0000 mg | ORAL_TABLET | ORAL | Status: DC | PRN
  Filled 2017-04-09: qty 1

## 2017-04-09 NOTE — Consult Note (Signed)
Chief Complaint: Patient was seen in consultation today for CT-guided retroperitoneal lymph node biopsy and CT-guided bone marrow biopsy at the request of Finnegan,Timothy J  Referring Physician(s): Finnegan,Timothy J  Patient Status: [facility] - Out-pt  History of Present Illness: Lisa [name] is a 56 y.o. female with recent onset fatigue and fevers.  Symptoms started at the end of October.  The fatigue has mildly improved but she continues to have intermittent fevers.  She also had decreased appetite and weight loss.  Cross-sectional imaging has demonstrated abnormal lymph nodes in the neck and abdomen.  Imaging findings raise concern for lymphoma.  Tissue diagnosis is needed.  Patient denies any significant pain.  She has no difficulty breathing and no focal neurologic deficits..  Past Medical History:  Diagnosis Date  . Arthritis   . Cirrhosis ([place])   . Diabetes mellitus   . GERD (gastroesophageal reflux disease)   . Hypertension     Past Surgical History:  Procedure Laterality Date  . ABDOMINAL HYSTERECTOMY     2009  . CHOLECYSTECTOMY  1997  . CORONARY ANGIOPLASTY    . CORONARY STENT INTERVENTION N/A 09/13/2016   Procedure: Coronary Stent Intervention;  Surgeon: Yolonda Kida, MD;  Location: [place] [facility];  Service: Cardiovascular;  Laterality: N/A;  . GALLBLADDER SURGERY    . HERNIA REPAIR  9/48/0165   periumbilical with right ooph  . KNEE SURGERY  12/2011   total knee replacement Dr. Duayne Cal  . LEFT HEART CATH AND CORONARY ANGIOGRAPHY N/A 09/13/2016   Procedure: Left Heart Cath and Coronary Angiography;  Surgeon: Yolonda Kida, MD;  Location: [place] [facility];  Service: Cardiovascular;  Laterality: N/A;  . TUBAL LIGATION      Allergies: Amoxicillin-pot clavulanate; Ciprofloxacin; and Clarithromycin  Medications: Prior to Admission medications   Medication Sig Start Date End Date Taking? Authorizing Provider  aspirin 81 MG tablet Take 1  tablet by mouth daily.   Yes [provider]  Blood Glucose Monitoring Suppl (FIFTY50 GLUCOSE METER 2.0) W/DEVICE KIT Use as directed. 03/17/14  Yes [provider]  doxycycline (VIBRA-TABS) 100 MG tablet Take 1 tablet (100 mg total) by mouth 2 (two) times daily. 03/03/17  Yes Harvest Dark, MD  famotidine (PEPCID) 40 MG tablet Take 1 tablet (40 mg total) by mouth every evening. 03/03/17 03/03/18 Yes Nance Pear, MD  glyBURIDE (DIABETA) 2.5 MG tablet Take 1 tablet by mouth daily. 10/08/13  Yes [provider]  HYDROcodone-acetaminophen (NORCO/VICODIN) 5-325 MG tablet Take 1 tablet by mouth every 4 (four) hours as needed. 03/03/17  Yes Harvest Dark, MD  insulin aspart (NOVOLOG) 100 UNIT/ML injection Take 12 units twice daily before meals 02/22/15  Yes [provider]  insulin aspart (NOVOLOG) 100 UNIT/ML injection Inject 15 Units into the skin 3 (three) times daily before meals. 09/14/16  Yes Callwood, Dwayne D, MD  insulin glargine (LANTUS) 100 UNIT/ML injection Inject 50 Units into the skin daily.   Yes [provider]  JARDIANCE 25 MG TABS tablet Take 25 mg daily by mouth. 02/17/17  Yes [provider]  KLOR-CON 10 10 MEQ tablet TAKE 1 TABLET (10 MEQ TOTAL) BY MOUTH 2 (TWO) TIMES DAILY. 04/05/17  Yes Carles Collet M, PA-C  lisinopril (PRINIVIL,ZESTRIL) 20 MG tablet Take 1 tablet (20 mg total) by mouth daily. 09/20/15  Yes Margarita Rana, MD  metFORMIN (GLUMETZA) 1000 MG (MOD) 24 hr tablet Take 1,000 mg by mouth daily with breakfast.     Yes [provider]  metoprolol succinate (TOPROL-XL) 25 MG 24 hr tablet Take 1 tablet (25 mg total) by mouth daily. Take with or immediately following a meal. 09/15/16  Yes Callwood, Dwayne D, MD  MULTIPLE VITAMINS-MINERALS PO Take 1 tablet by mouth daily.   Yes [provider]  pantoprazole (PROTONIX) 40 MG tablet TAKE 1 TABLET BY MOUTH  DAILY 03/19/17  Yes Fenton Malling M,  PA-C  prochlorperazine (COMPAZINE) 10 MG tablet Take 1 tablet (10 mg total) every 6 (six) hours as needed by mouth for nausea or vomiting. 03/23/17  Yes Lloyd Huger, MD  terconazole (TERAZOL 7) 0.4 % vaginal cream Place 1 applicator vaginally at bedtime. 03/05/17  Yes Carles Collet M, PA-C  ursodiol (ACTIGALL) 300 MG capsule Take 300 mg by mouth 2 (two) times daily.   Yes [provider]  atorvastatin (LIPITOR) 80 MG tablet Take 1 tablet (80 mg total) by mouth daily at 6 PM. 09/14/16 10/14/16  Callwood, Dwayne D, MD  clopidogrel (PLAVIX) 75 MG tablet Take 75 mg by mouth daily.    [provider]  insulin lispro (HUMALOG) 100 UNIT/ML injection Inject 15 Units into the skin 3 (three) times daily before meals.    [provider]  Insulin Pen Needle (B-D UF III MINI PEN NEEDLES) 31G X 5 MM MISC Use 1 needle daily with victoza injection 02/01/15   [provider]  isosorbide mononitrate (IMDUR) 60 MG 24 hr tablet Take 1 tablet (60 mg total) by mouth daily. 09/15/16 10/15/16  Callwood, Loran Senters, MD     Family History  Problem Relation Age of Onset  . Diabetes Sister   . Diabetes Sister   . Diabetes Sister   . Diabetes Mother   . Heart disease Father     Social History   Socioeconomic History  . Marital status: Married    Spouse name: None  . Number of children: 1  . Years of education: None  . Highest education level: None  Social Needs  . Financial resource strain: None  . Food insecurity - worry: None  . Food insecurity - inability: None  . Transportation needs - medical: None  . Transportation needs - non-medical: None  Occupational History  . None  Tobacco Use  . Smoking status: Never Smoker  . Smokeless tobacco: Never Used  Substance and Sexual Activity  . Alcohol use: No  . Drug use: No  . Sexual activity: None  Other Topics Concern  . None  Social History Narrative  . None     Review of Systems: A 12 point ROS discussed and  pertinent positives are indicated in the HPI above.  All other systems are negative.  Review of Systems  Constitutional: Positive for activity change, appetite change, fatigue, fever and unexpected weight change.  Respiratory: Negative.   Cardiovascular: Negative.   Gastrointestinal: Negative.   Genitourinary: Negative.   Neurological: Positive for weakness.    Vital Signs: BP 100/84   Pulse 91   Temp 100 F (37.8 C) (Oral)   Resp 20   Ht 5' 9"  (1.753 m)   Wt 189 lb (85.7 kg)   SpO2 99%   BMI 27.91 kg/m   Physical Exam  Constitutional: No distress.  HENT:  Mouth/Throat: Oropharynx is clear and moist.  Cardiovascular: Normal rate, regular rhythm and normal heart sounds.  Pulmonary/Chest: Effort normal and breath sounds normal.  Abdominal: Soft. Bowel sounds are normal.  Musculoskeletal: She exhibits no edema.    Imaging: Nm Pet Image  Initial (pi) Skull Base To Thigh  Result Date: 03/21/2017 CLINICAL DATA:  Initial treatment strategy for lymphadenopathy. Abdominal pain and nausea. EXAM: NUCLEAR MEDICINE PET SKULL BASE TO THIGH TECHNIQUE: Twelve point for mCi F-18 FDG was injected intravenously. Full-ring PET imaging was performed from the skull base to thigh after the radiotracer. CT data was obtained and used for attenuation correction and anatomic localization. FASTING BLOOD GLUCOSE:  Value: 97 mg/dl COMPARISON:  CT chest abdomen pelvis 03/03/2017 and MR abdomen 01/12/2016. FINDINGS: NECK: Hypermetabolic bilateral level 2 lymph nodes measure up to 10 mm on the right, with an SUV max of 12.2. CT images show no acute findings. CHEST: No hypermetabolic mediastinal, hilar or axillary lymph nodes. No hypermetabolic pulmonary nodules. Atherosclerotic calcification of the arterial vasculature, including three-vessel involvement of the coronary arteries. No pericardial or pleural effusion. Lungs are grossly clear. ABDOMEN/PELVIS: No abnormal hypermetabolism in the liver, adrenal glands,  spleen or pancreas. There is hypermetabolic abdominal retroperitoneal and abdominal peritoneal ligament adenopathy. Index conglomerate porta hepatis adenopathy has an SUV max of 18.3 and is better measured on 03/03/2017. Index aortocaval lymph node measures approximately 1.4 cm (CT image 170) with an SUV max of 11.4. No hypermetabolic adenopathy in the pelvis. Liver, adrenal glands, kidneys, spleen, pancreas, stomach and bowel are otherwise unremarkable. Bilateral inguinal hernia repair with a large fat containing left inguinal hernia. SKELETON: No abnormal osseous hypermetabolism. IMPRESSION: 1. Hypermetabolic adenopathy in the neck and abdomen, most indicative of lymphoma. 2. Aortic atherosclerosis (ICD10-170.0). Three-vessel coronary artery calcification. 3. Bilateral inguinal hernia repairs with a large fat containing left inguinal hernia. Electronically Signed   By: Lorin Picket M.D.   On: 03/21/2017 13:39    Labs:  CBC: Recent Labs    03/03/17 1216 03/19/17 1145 04/05/17 0734 04/09/17 0920  WBC 7.7 5.4 5.4 7.7  HGB 12.8 13.1 11.2* 11.7*  HCT 39.3 40.2 35.1 36.8  PLT 138* 161 171 189    COAGS: Recent Labs    04/05/17 0734 04/09/17 0920  INR 0.92 0.96  APTT 34  --     BMP: Recent Labs    02/26/17 1611 03/03/17 1216  NA 136 134*  K 3.3* 3.0*  CL 100* 96*  CO2 26 26  GLUCOSE 196* 208*  BUN 10 12  CALCIUM 9.0 9.0  CREATININE 0.65 0.65  GFRNONAA >60 >60  GFRAA >60 >60    LIVER FUNCTION TESTS: Recent Labs    02/26/17 1611 03/03/17 1216  BILITOT 0.8 0.9  AST 104* 87*  ALT 134* 114*  ALKPHOS 430* 573*  PROT 8.5* 9.1*  ALBUMIN 3.3* 3.2*    TUMOR MARKERS: No results for input(s): AFPTM, CEA, CA199, CHROMGRNA in the last 8760 hours.  Assessment and Plan:  56 year old female with abnormal lymph nodes in the neck and abdomen.  Findings are concerning for a neoplastic process and a tissue diagnosis is needed.  Patient is scheduled for CT-guided bone marrow  biopsy and a CT-guided retroperitoneal lymph node biopsy.  Both procedures were discussed with the patient.  The risks include bleeding and infection.  Patient has a good understanding of the procedures and informed consent was obtained.  Plan for CT-guided biopsies with moderate sedation.  Thank you for this interesting consult.  I greatly enjoyed meeting Lisa [name] and look forward to participating in their care.  A copy of this report was sent to the requesting provider on this date.  Electronically Signed: Burman Riis, MD 04/09/2017, 10:10 AM   I spent a  total of  15 Minutes   in face to face in clinical consultation, greater than 50% of which was counseling/coordinating care for CT guided biopsies.

## 2017-04-09 NOTE — Procedures (Signed)
CT guided bone marrow biopsy:  2 aspirates and 2 cores  CT guided core biopsies of left retroperitoneal lymph node.  7 cores obtained.    Minimal blood loss and no immediate complication.

## 2017-04-12 LAB — SURGICAL PATHOLOGY

## 2017-04-26 ENCOUNTER — Encounter (HOSPITAL_COMMUNITY): Payer: Self-pay

## 2017-04-26 LAB — CHROMOSOME ANALYSIS, BONE MARROW

## 2017-05-02 ENCOUNTER — Telehealth: Payer: Self-pay | Admitting: *Deleted

## 2017-05-02 NOTE — Telephone Encounter (Signed)
Patient informed of Dr Virgel Manifold response ans accepted appointment for 05/09/16 @ 315

## 2017-05-02 NOTE — Telephone Encounter (Signed)
Bone marrow biopsy was negative.  Lymph node biopsy did not reveal enough tissue for diagnosis.  Please ensure patient has follow-up next week for additional diagnostic planning.

## 2017-05-02 NOTE — Telephone Encounter (Signed)
Patient called requesting someone call her ASAP with results from her biopsy 3 weeks ago. She has no follow up appointment scheduled. Please return her call [phone]

## 2017-05-07 NOTE — Progress Notes (Signed)
[facility]  Telephone:([phone]) [phone] [phone]) [phone]  ID: Lisa [name] OB: 1960/10/13  MR#: [id number]  GGY#:[id number]  Patient Care Team: Rubye Beach as PCP - General (Family Medicine)  CHIEF COMPLAINT: Lymphadenopathy.  INTERVAL HISTORY: Patient returns to clinic today for further evaluation and discussion of her biopsy results.  She complains of worsening weight loss and persistent fevers.  She also has increased nausea and vomiting with a poor appetite.  She has no neurologic complaints.  She has no chest pain or shortness of breath.  She denies any constipation or diarrhea.  She continues to have intermittent abdominal pain.  She has no urinary complaints.  Patient feels generally terrible, but offers no further specific complaints today.  REVIEW OF SYSTEMS:   Review of Systems  Constitutional: Positive for fever and malaise/fatigue. Negative for chills, diaphoresis and weight loss.  Respiratory: Negative.  Negative for cough, hemoptysis and shortness of breath.   Cardiovascular: Negative.  Negative for chest pain and leg swelling.  Gastrointestinal: Positive for abdominal pain, nausea and vomiting. Negative for constipation and diarrhea.  Genitourinary: Negative.  Negative for hematuria.  Musculoskeletal: Negative.   Skin: Negative.  Negative for rash.  Neurological: Positive for weakness.  Psychiatric/Behavioral: The patient is nervous/anxious.     As per HPI. Otherwise, a complete review of systems is negative.  PAST MEDICAL HISTORY: Past Medical History:  Diagnosis Date  . Arthritis   . Cirrhosis ([place])   . Diabetes mellitus   . GERD (gastroesophageal reflux disease)   . Hypertension     PAST SURGICAL HISTORY: Past Surgical History:  Procedure Laterality Date  . ABDOMINAL HYSTERECTOMY     2009  . CHOLECYSTECTOMY  1997  . CORONARY ANGIOPLASTY    . CORONARY STENT INTERVENTION N/A 09/13/2016   Procedure: Coronary Stent  Intervention;  Surgeon: Yolonda Kida, MD;  Location: [place] [facility];  Service: Cardiovascular;  Laterality: N/A;  . GALLBLADDER SURGERY    . HERNIA REPAIR  0/35/0093   periumbilical with right ooph  . KNEE SURGERY  12/2011   total knee replacement Dr. Duayne Cal  . LEFT HEART CATH AND CORONARY ANGIOGRAPHY N/A 09/13/2016   Procedure: Left Heart Cath and Coronary Angiography;  Surgeon: Yolonda Kida, MD;  Location: [place] [facility];  Service: Cardiovascular;  Laterality: N/A;  . TUBAL LIGATION      FAMILY HISTORY: Family History  Problem Relation Age of Onset  . Diabetes Sister   . Diabetes Sister   . Diabetes Sister   . Diabetes Mother   . Heart disease Father     ADVANCED DIRECTIVES (Y/N):  N  HEALTH MAINTENANCE: Social History   Tobacco Use  . Smoking status: Never Smoker  . Smokeless tobacco: Never Used  Substance Use Topics  . Alcohol use: No  . Drug use: No     Colonoscopy:  PAP:  Bone density:  Lipid panel:  Allergies  Allergen Reactions  . Amoxicillin-Pot Clavulanate   . Ciprofloxacin   . Clarithromycin     Current Outpatient Medications  Medication Sig Dispense Refill  . aspirin 81 MG tablet Take 1 tablet by mouth daily.    . Blood Glucose Monitoring Suppl (FIFTY50 GLUCOSE METER 2.0) W/DEVICE KIT Use as directed.    . clopidogrel (PLAVIX) 75 MG tablet Take 75 mg by mouth daily.    Marland Kitchen doxycycline (VIBRA-TABS) 100 MG tablet Take 1 tablet (100 mg total) by mouth 2 (two) times daily. 20 tablet 0  .  Dulaglutide (TRULICITY) 1.5 EX/5.2WU SOPN Inject into the skin.    . famotidine (PEPCID) 40 MG tablet Take 1 tablet (40 mg total) by mouth every evening. 30 tablet 1  . glyBURIDE (DIABETA) 2.5 MG tablet Take 1 tablet by mouth daily.    Marland Kitchen HYDROcodone-acetaminophen (NORCO/VICODIN) 5-325 MG tablet Take 1 tablet by mouth every 4 (four) hours as needed. 15 tablet 0  . insulin aspart (NOVOLOG) 100 UNIT/ML injection Take 12 units twice daily before meals    .  insulin aspart (NOVOLOG) 100 UNIT/ML injection Inject 15 Units into the skin 3 (three) times daily before meals. 10 mL 11  . insulin glargine (LANTUS) 100 UNIT/ML injection Inject 50 Units into the skin daily.    . insulin lispro (HUMALOG) 100 UNIT/ML injection Inject 15 Units into the skin 3 (three) times daily before meals.    . Insulin Pen Needle (B-D UF III MINI PEN NEEDLES) 31G X 5 MM MISC Use 1 needle daily with victoza injection    . JARDIANCE 25 MG TABS tablet Take 25 mg daily by mouth.  6  . KLOR-CON 10 10 MEQ tablet TAKE 1 TABLET (10 MEQ TOTAL) BY MOUTH 2 (TWO) TIMES DAILY. 60 tablet 0  . lisinopril (PRINIVIL,ZESTRIL) 20 MG tablet Take 1 tablet (20 mg total) by mouth daily. 90 tablet 3  . metFORMIN (GLUMETZA) 1000 MG (MOD) 24 hr tablet Take 1,000 mg by mouth daily with breakfast.      . metoprolol succinate (TOPROL-XL) 25 MG 24 hr tablet Take 1 tablet (25 mg total) by mouth daily. Take with or immediately following a meal. 30 tablet 6  . MULTIPLE VITAMINS-MINERALS PO Take 1 tablet by mouth daily.    . pantoprazole (PROTONIX) 40 MG tablet TAKE 1 TABLET BY MOUTH  DAILY 90 tablet 1  . prochlorperazine (COMPAZINE) 10 MG tablet Take 1 tablet (10 mg total) every 6 (six) hours as needed by mouth for nausea or vomiting. 30 tablet 1  . terconazole (TERAZOL 7) 0.4 % vaginal cream Place 1 applicator vaginally at bedtime. 45 g 0  . ursodiol (ACTIGALL) 300 MG capsule Take 300 mg by mouth 2 (two) times daily.    Marland Kitchen atorvastatin (LIPITOR) 80 MG tablet Take 1 tablet (80 mg total) by mouth daily at 6 PM. 30 tablet 12  . isosorbide mononitrate (IMDUR) 60 MG 24 hr tablet Take 1 tablet (60 mg total) by mouth daily. 30 tablet 6   No current facility-administered medications for this visit.     OBJECTIVE: Vitals:   05/09/17 1528  BP: 126/80  Pulse: 98  Resp: 18  Temp: 99.7 F (37.6 C)     Body mass index is 28.15 kg/m.    ECOG FS:1 - Symptomatic but completely ambulatory  General: Well-developed,  well-nourished, no acute distress. Eyes: Pink conjunctiva, anicteric sclera. Lungs: Clear to auscultation bilaterally. Heart: Regular rate and rhythm. No rubs, murmurs, or gallops. Abdomen: Soft, nontender, nondistended. No organomegaly noted, normoactive bowel sounds. Musculoskeletal: No edema, cyanosis, or clubbing. Neuro: Alert, answering all questions appropriately. Cranial nerves grossly intact. Skin: No rashes or petechiae noted. Psych: Normal affect. Lymphatics: No palpable cervical, calvicular, axillary or inguinal LAD.   LAB RESULTS:  Lab Results  Component Value Date   NA 134 (L) 03/03/2017   K 3.0 (L) 03/03/2017   CL 96 (L) 03/03/2017   CO2 26 03/03/2017   GLUCOSE 208 (H) 03/03/2017   BUN 12 03/03/2017   CREATININE 0.65 03/03/2017   CALCIUM 9.0 03/03/2017  PROT 9.1 (H) 03/03/2017   ALBUMIN 3.2 (L) 03/03/2017   AST 87 (H) 03/03/2017   ALT 114 (H) 03/03/2017   ALKPHOS 573 (H) 03/03/2017   BILITOT 0.9 03/03/2017   GFRNONAA >60 03/03/2017   GFRAA >60 03/03/2017    Lab Results  Component Value Date   WBC 7.7 04/09/2017   NEUTROABS 5.8 04/09/2017   HGB 11.7 (L) 04/09/2017   HCT 36.8 04/09/2017   MCV 79.3 (L) 04/09/2017   PLT 189 04/09/2017     STUDIES: No results found.  ASSESSMENT: Lymphadenopathy  PLAN:    1. Lymphadenopathy: PET scan results from March 21, 2017 reviewed independently with lymphadenopathy above and below the diaphragm suspicious for lymphoma.  Patient had a CT-guided bone marrow biopsy as well as a lymph node biopsy on April 09, 2017.  Bone marrow biopsy was negative for any lymphoma.  CT biopsy was insufficient specimen to make a diagnosis.  After lengthy discussion with the patient, she is agreed to a second CT-guided biopsy.  Patient will follow-up 1 week after her biopsy discuss the results and treatment planning if necessary.   2.  Nausea/vomiting: Unclear etiology.  Does not appear related to underlying pathology.  Continue  Compazine and Zofran as prescribed.   3.  Fevers: Patient is afebrile in clinic today.  No obvious infectious source, monitor.  Patient was instructed to ibuprofen sparingly. 4.  Microcytosis: Hemoglobinopathy profile was normal. 5.  Elevated liver enzymes: Chronic, monitor.  Approximately 30 minutes was spent in discussion of which greater than 50% was consultation.  Patient expressed understanding and was in agreement with this plan. She also understands that She can call clinic at any time with any questions, concerns, or complaints.   Cancer Staging No matching staging information was found for the patient.  Lloyd Huger, MD   05/09/2017 3:40 PM

## 2017-05-09 ENCOUNTER — Inpatient Hospital Stay: Payer: 59 | Attending: Oncology | Admitting: Oncology

## 2017-05-09 VITALS — BP 126/80 | HR 98 | Temp 99.7°F | Resp 18 | Wt 190.6 lb

## 2017-05-09 DIAGNOSIS — Z90710 Acquired absence of both cervix and uterus: Secondary | ICD-10-CM | POA: Insufficient documentation

## 2017-05-09 DIAGNOSIS — R634 Abnormal weight loss: Secondary | ICD-10-CM | POA: Diagnosis not present

## 2017-05-09 DIAGNOSIS — R591 Generalized enlarged lymph nodes: Secondary | ICD-10-CM | POA: Diagnosis present

## 2017-05-09 DIAGNOSIS — M129 Arthropathy, unspecified: Secondary | ICD-10-CM | POA: Diagnosis not present

## 2017-05-09 DIAGNOSIS — Z794 Long term (current) use of insulin: Secondary | ICD-10-CM | POA: Insufficient documentation

## 2017-05-09 DIAGNOSIS — K219 Gastro-esophageal reflux disease without esophagitis: Secondary | ICD-10-CM | POA: Diagnosis not present

## 2017-05-09 DIAGNOSIS — R112 Nausea with vomiting, unspecified: Secondary | ICD-10-CM | POA: Diagnosis not present

## 2017-05-09 DIAGNOSIS — K7460 Unspecified cirrhosis of liver: Secondary | ICD-10-CM | POA: Diagnosis not present

## 2017-05-09 DIAGNOSIS — R509 Fever, unspecified: Secondary | ICD-10-CM | POA: Insufficient documentation

## 2017-05-09 DIAGNOSIS — Z9049 Acquired absence of other specified parts of digestive tract: Secondary | ICD-10-CM | POA: Diagnosis not present

## 2017-05-09 DIAGNOSIS — I10 Essential (primary) hypertension: Secondary | ICD-10-CM | POA: Diagnosis not present

## 2017-05-09 DIAGNOSIS — E119 Type 2 diabetes mellitus without complications: Secondary | ICD-10-CM | POA: Insufficient documentation

## 2017-05-09 DIAGNOSIS — R630 Anorexia: Secondary | ICD-10-CM | POA: Diagnosis not present

## 2017-05-09 DIAGNOSIS — Z7982 Long term (current) use of aspirin: Secondary | ICD-10-CM | POA: Diagnosis not present

## 2017-05-09 DIAGNOSIS — R748 Abnormal levels of other serum enzymes: Secondary | ICD-10-CM | POA: Diagnosis not present

## 2017-05-09 DIAGNOSIS — Z79899 Other long term (current) drug therapy: Secondary | ICD-10-CM | POA: Diagnosis not present

## 2017-05-11 ENCOUNTER — Other Ambulatory Visit: Payer: Self-pay | Admitting: *Deleted

## 2017-05-11 DIAGNOSIS — R591 Generalized enlarged lymph nodes: Secondary | ICD-10-CM

## 2017-05-22 ENCOUNTER — Other Ambulatory Visit: Payer: Self-pay | Admitting: Radiology

## 2017-05-22 DIAGNOSIS — E1165 Type 2 diabetes mellitus with hyperglycemia: Secondary | ICD-10-CM | POA: Diagnosis not present

## 2017-05-22 DIAGNOSIS — E1169 Type 2 diabetes mellitus with other specified complication: Secondary | ICD-10-CM | POA: Diagnosis not present

## 2017-05-22 DIAGNOSIS — E1159 Type 2 diabetes mellitus with other circulatory complications: Secondary | ICD-10-CM | POA: Diagnosis not present

## 2017-05-22 DIAGNOSIS — Z794 Long term (current) use of insulin: Secondary | ICD-10-CM | POA: Diagnosis not present

## 2017-05-22 LAB — HEMOGLOBIN A1C: Hemoglobin A1C: 8.3

## 2017-05-23 ENCOUNTER — Ambulatory Visit
Admission: RE | Admit: 2017-05-23 | Discharge: 2017-05-23 | Disposition: A | Discharge status: Home / Self Care | Payer: 59 | Source: Ambulatory Visit | Attending: Oncology | Admitting: Oncology

## 2017-05-23 ENCOUNTER — Other Ambulatory Visit: Payer: Self-pay | Admitting: *Deleted

## 2017-05-23 DIAGNOSIS — R590 Localized enlarged lymph nodes: Secondary | ICD-10-CM | POA: Diagnosis not present

## 2017-05-23 DIAGNOSIS — R591 Generalized enlarged lymph nodes: Secondary | ICD-10-CM | POA: Diagnosis present

## 2017-05-23 LAB — GLUCOSE, CAPILLARY
Glucose-Capillary: 61 mg/dL — ABNORMAL LOW (ref 65–99)
Glucose-Capillary: 110 mg/dL — ABNORMAL HIGH (ref 65–99)

## 2017-05-23 LAB — CBC
HCT: 38.3 % (ref 35.0–47.0)
Hemoglobin: 12.1 g/dL (ref 12.0–16.0)
MCH: 24.8 pg — ABNORMAL LOW (ref 26.0–34.0)
MCHC: 31.7 g/dL — ABNORMAL LOW (ref 32.0–36.0)
MCV: 78.5 fL — ABNORMAL LOW (ref 80.0–100.0)
Platelets: 188 10*3/uL (ref 150–440)
RBC: 4.88 MIL/uL (ref 3.80–5.20)
RDW: 16 % — ABNORMAL HIGH (ref 11.5–14.5)
WBC: 6.6 10*3/uL (ref 3.6–11.0)

## 2017-05-23 LAB — APTT: aPTT: 34 seconds (ref 24–36)

## 2017-05-23 LAB — PROTIME-INR
INR: 0.93
Prothrombin Time: 12.4 seconds (ref 11.4–15.2)

## 2017-05-23 MED ORDER — FENTANYL CITRATE (PF) 100 MCG/2ML IJ SOLN
INTRAMUSCULAR | Status: AC | PRN
  Administered 2017-05-23: 50 ug via INTRAVENOUS

## 2017-05-23 MED ORDER — DEXTROSE 50 % IV SOLN
12.5000 g | Freq: Once | INTRAVENOUS | Status: AC
  Administered 2017-05-23: 25 g via INTRAVENOUS
  Filled 2017-05-23: qty 50

## 2017-05-23 MED ORDER — DEXTROSE 50 % IV SOLN
INTRAVENOUS | Status: AC
  Filled 2017-05-23: qty 50

## 2017-05-23 MED ORDER — MIDAZOLAM HCL 5 MG/5ML IJ SOLN
INTRAMUSCULAR | Status: AC | PRN
  Administered 2017-05-23: 1 mg via INTRAVENOUS

## 2017-05-23 MED ORDER — FENTANYL CITRATE (PF) 100 MCG/2ML IJ SOLN
INTRAMUSCULAR | Status: AC
  Filled 2017-05-23: qty 4

## 2017-05-23 MED ORDER — SODIUM CHLORIDE 0.9 % IV SOLN
INTRAVENOUS | Status: DC
  Administered 2017-05-23: 14:00:00 via INTRAVENOUS

## 2017-05-23 MED ORDER — MIDAZOLAM HCL 5 MG/5ML IJ SOLN
INTRAMUSCULAR | Status: AC
  Filled 2017-05-23: qty 5

## 2017-05-23 NOTE — Procedures (Signed)
  Procedure: [place] core R cervical level 3 lymph node (attempted) too close to CCA and vagus nerve  EBL:   minimal Complications:  none immediate  See full dictation in [facility].  Dillard Cannon MD Main # [phone] Pager  [phone]

## 2017-05-23 NOTE — Progress Notes (Signed)
ambu

## 2017-05-30 ENCOUNTER — Ambulatory Visit: Payer: 59 | Admitting: Surgery

## 2017-05-30 ENCOUNTER — Telehealth: Payer: Self-pay

## 2017-05-30 ENCOUNTER — Encounter: Payer: Self-pay | Admitting: Surgery

## 2017-05-30 ENCOUNTER — Inpatient Hospital Stay: Payer: 59 | Admitting: Oncology

## 2017-05-30 VITALS — BP 124/73 | HR 79 | Temp 99.3°F | Ht 69.0 in | Wt 190.6 lb

## 2017-05-30 DIAGNOSIS — Z711 Person with feared health complaint in whom no diagnosis is made: Secondary | ICD-10-CM | POA: Diagnosis not present

## 2017-05-30 DIAGNOSIS — R590 Localized enlarged lymph nodes: Secondary | ICD-10-CM

## 2017-05-30 DIAGNOSIS — R591 Generalized enlarged lymph nodes: Secondary | ICD-10-CM | POA: Diagnosis not present

## 2017-05-30 NOTE — H&P (View-Only) (Signed)
Surgical Clinic History and Physical  Referring provider:  Rubye [place] [address] [place], [place] [address]  HISTORY OF PRESENT ILLNESS (HPI):  57 y.o. female presents upon referral for excisional biopsy of Right cervical lymphadenopathy. Patient reports she began experiencing lethargy, nausea, and fevers 4 months ago, since which she has also lost 40 lbs. Workup including PET-CT revealed Right cervical and retroperitoneal lymphadenopathy concerning for lymphoma. However, subsequent CT-guided retroperitoneal core needle biopsy was non-diagnostic, and ultrasound-guided biopsy of Right cervical lymphadenopathy could not be performed due to proximity of neck vessels. Plavix prescribed following placement of drug-eluting stent to LAD (09/2016) was stopped 1/3 for attempted biopsy and has not since been restarted. Patient otherwise denies CP or SOB.  PAST MEDICAL HISTORY (PMH):  Past Medical History:  Diagnosis Date  . Allergic rhinitis 02/15/2015  . Arthritis   . BP (high blood pressure) 02/15/2015  . Cirrhosis ([place])   . Diabetes mellitus   . Elevated liver enzymes 09/23/2015  . Elevated liver enzymes 09/23/2015  . GERD (gastroesophageal reflux disease)   . Gonalgia 02/15/2015  . Hernia, lateral ventral 11/19/2012  . Hypertension   . Intra-abdominal and pelvic swelling, mass and lump, unspecified site 11/19/2012     PAST SURGICAL HISTORY [facility]):  Past Surgical History:  Procedure Laterality Date  . ABDOMINAL HYSTERECTOMY     2009  . CHOLECYSTECTOMY  1997  . CORONARY ANGIOPLASTY    . CORONARY STENT INTERVENTION N/A 09/13/2016   Procedure: Coronary Stent Intervention;  Surgeon: Yolonda Kida, MD;  Location: [place] [facility];  Service: Cardiovascular;  Laterality: N/A;  . GALLBLADDER SURGERY    . HERNIA REPAIR  6/76/7209   periumbilical with right ooph  . KNEE SURGERY  12/2011   total knee replacement Dr. Duayne Cal  . LEFT HEART CATH AND CORONARY  ANGIOGRAPHY N/A 09/13/2016   Procedure: Left Heart Cath and Coronary Angiography;  Surgeon: Yolonda Kida, MD;  Location: [place] [facility];  Service: Cardiovascular;  Laterality: N/A;  . TUBAL LIGATION       MEDICATIONS:  Prior to Admission medications   Medication Sig Start Date End Date Taking? Authorizing Provider  aspirin 81 MG tablet Take 1 tablet by mouth daily.   Yes [provider]  Blood Glucose Monitoring Suppl (FIFTY50 GLUCOSE METER 2.0) W/DEVICE KIT Use as directed. 03/17/14  Yes [provider]  clopidogrel (PLAVIX) 75 MG tablet Take 75 mg by mouth daily.   Yes [provider]  doxycycline (VIBRA-TABS) 100 MG tablet Take 1 tablet (100 mg total) by mouth 2 (two) times daily. 03/03/17  Yes Harvest Dark, MD  Dulaglutide (TRULICITY) 1.5 OB/0.9GG SOPN Inject into the skin. 04/02/17  Yes [provider]  famotidine (PEPCID) 40 MG tablet Take 1 tablet (40 mg total) by mouth every evening. 03/03/17 03/03/18 Yes Nance Pear, MD  glyBURIDE (DIABETA) 2.5 MG tablet Take 1 tablet by mouth daily. 10/08/13  Yes [provider]  HYDROcodone-acetaminophen (NORCO/VICODIN) 5-325 MG tablet Take 1 tablet by mouth every 4 (four) hours as needed. 03/03/17  Yes Harvest Dark, MD  insulin aspart (NOVOLOG) 100 UNIT/ML injection Take 12 units twice daily before meals 02/22/15  Yes [provider]  insulin aspart (NOVOLOG) 100 UNIT/ML injection Inject 15 Units into the skin 3 (three) times daily before meals. 09/14/16  Yes Callwood, Dwayne D, MD  insulin glargine (LANTUS) 100 UNIT/ML injection Inject 50 Units into the skin daily.   Yes [provider]  insulin lispro (  HUMALOG) 100 UNIT/ML injection Inject 15 Units into the skin 3 (three) times daily before meals.   Yes [provider]  Insulin Pen Needle (B-D UF III MINI PEN NEEDLES) 31G X 5 MM MISC Use 1 needle daily with victoza injection 02/01/15  Yes [provider]  JARDIANCE 25 MG TABS tablet Take 25 mg daily by mouth. 02/17/17  Yes [provider]  KLOR-CON 10 10 MEQ tablet TAKE 1 TABLET (10 MEQ TOTAL) BY MOUTH 2 (TWO) TIMES DAILY. 04/05/17  Yes Carles Collet M, PA-C  lisinopril (PRINIVIL,ZESTRIL) 20 MG tablet Take 1 tablet (20 mg total) by mouth daily. 09/20/15  Yes Margarita Rana, MD  metFORMIN (GLUMETZA) 1000 MG (MOD) 24 hr tablet Take 1,000 mg by mouth daily with breakfast.     Yes [provider]  metoprolol succinate (TOPROL-XL) 25 MG 24 hr tablet Take 1 tablet (25 mg total) by mouth daily. Take with or immediately following a meal. 09/15/16  Yes Callwood, Dwayne D, MD  MULTIPLE VITAMINS-MINERALS PO Take 1 tablet by mouth daily.   Yes [provider]  prochlorperazine (COMPAZINE) 10 MG tablet Take 1 tablet (10 mg total) every 6 (six) hours as needed by mouth for nausea or vomiting. 03/23/17  Yes Lloyd Huger, MD  terconazole (TERAZOL 7) 0.4 % vaginal cream Place 1 applicator vaginally at bedtime. 03/05/17  Yes Carles Collet M, PA-C  ursodiol (ACTIGALL) 300 MG capsule Take 300 mg by mouth 2 (two) times daily.   Yes [provider]  atorvastatin (LIPITOR) 80 MG tablet Take 1 tablet (80 mg total) by mouth daily at 6 PM. 09/14/16 10/14/16  Callwood, Dwayne D, MD  isosorbide mononitrate (IMDUR) 60 MG 24 hr tablet Take 1 tablet (60 mg total) by mouth daily. 09/15/16 10/15/16  Callwood, Loran Senters, MD     ALLERGIES:  Allergies  Allergen Reactions  . Amoxicillin-Pot Clavulanate   . Ciprofloxacin   . Clarithromycin      SOCIAL HISTORY:  Social History   Socioeconomic History  . Marital status: Married    Spouse name: Not on file  . Number of children: 1  . Years of education: Not on file  . Highest education level: Not on file  Social Needs  . Financial resource strain: Not on file  . Food insecurity - worry: Not on file  . Food insecurity - inability: Not on file  . Transportation needs -  medical: Not on file  . Transportation needs - non-medical: Not on file  Occupational History  . Not on file  Tobacco Use  . Smoking status: Never Smoker  . Smokeless tobacco: Never Used  Substance and Sexual Activity  . Alcohol use: No  . Drug use: No  . Sexual activity: Not on file  Other Topics Concern  . Not on file  Social History Narrative  . Not on file    The patient currently resides (home / rehab facility / nursing home): Home The patient normally is (ambulatory / bedbound): Ambulatory  FAMILY HISTORY:  Family History  Problem Relation Age of Onset  . Diabetes Sister   . Diabetes Sister   . Diabetes Sister   . Diabetes Mother   . Heart disease Father     Otherwise negative/non-contributory.  REVIEW OF SYSTEMS:  Constitutional: fever and weight loss as per HPI  Eyes: denies any other vision changes, history of eye injury  ENT: denies sore throat, hearing problems  Respiratory: denies shortness of breath, wheezing  Cardiovascular: denies  chest pain, palpitations  Gastrointestinal: nausea as per HPI; denies abdominal pain, diarrhea, or constipation Musculoskeletal: denies any other joint pains or cramps  Skin: Denies any other rashes or skin discolorations Neurological: denies any other headache, dizziness, weakness  Psychiatric: Denies any other depression, anxiety   All other review of systems were otherwise negative   VITAL SIGNS:  BP 124/73   Pulse 79   Temp 99.3 F (37.4 C) (Oral)   Ht _0  (1.753 m)   Wt 190 lb 9.6 oz (86.5 kg)   BMI 28.15 kg/m   PHYSICAL EXAM:  Constitutional:  -- Normal body habitus  -- Awake, alert, and oriented x3  Eyes:  -- Pupils equally round and reactive to light  -- No scleral icterus  Ear, nose, throat:  -- No jugular venous distension -- No nasal drainage, bleeding -- Right cervical lymphadenopathy non-palpable, but enlarged lymph node corresponding to location of hypermetabolic Right cervical lymphadenopathy  visualized on bedside in-office Right neck ultrasound Pulmonary:  -- No crackles  -- Equal breath sounds bilaterally -- Breathing non-labored at rest Cardiovascular:  -- S1, S2 present  -- No pericardial rubs  Gastrointestinal:  -- Abdomen soft, nontender, non-distended, no guarding/rebound  -- No abdominal masses appreciated, pulsatile or otherwise  Musculoskeletal and Integumentary:  -- Wounds or skin discoloration: None appreciated -- Extremities: B/L UE and LE FROM, hands and feet warm, no edema  Neurologic:  -- Motor function: Intact and symmetric -- Sensation: Intact and symmetric  Labs:  CBC Latest Ref Rng & Units 05/23/2017 04/09/2017 04/05/2017  WBC 3.6 - 11.0 K/uL 6.6 7.7 5.4  Hemoglobin 12.0 - 16.0 g/dL 12.1 11.7(L) 11.2(L)  Hematocrit 35.0 - 47.0 % 38.3 36.8 35.1  Platelets 150 - 440 K/uL 188 189 171   CMP Latest Ref Rng & Units 03/03/2017 02/26/2017 01/12/2016  Glucose 65 - 99 mg/dL 208(H) 196(H) -  BUN 6 - 20 mg/dL 12 10 -  Creatinine 0.44 - 1.00 mg/dL 0.65 0.65 0.70  Sodium 135 - 145 mmol/L 134(L) 136 -  Potassium 3.5 - 5.1 mmol/L 3.0(L) 3.3(L) -  Chloride 101 - 111 mmol/L 96(L) 100(L) -  CO2 22 - 32 mmol/L 26 26 -  Calcium 8.9 - 10.3 mg/dL 9.0 9.0 -  Total Protein 6.5 - 8.1 g/dL 9.1(H) 8.5(H) -  Total Bilirubin 0.3 - 1.2 mg/dL 0.9 0.8 -  Alkaline Phos 38 - 126 U/L 573(H) 430(H) -  AST 15 - 41 U/L 87(H) 104(H) -  ALT 14 - 54 U/L 114(H) 134(H) -    Imaging studies:  PET-CT (03/21/2017) - personally reviewed with patient and her husband in office today 1. Hypermetabolic adenopathy in the neck and abdomen, most indicative of lymphoma. 2. Aortic atherosclerosis (ICD10-170.0). Three-vessel coronary artery calcification. 3. Bilateral inguinal hernia repairs with a large fat containing left inguinal hernia.  CT Abdomen and Pelvis with Contrast (03/03/2017) - personally reviewed and discussed with patient No acute findings in abdomen/pelvis.  Heterogeneous  opacification over the lung bases left greater than right likely atelectasis, although cannot exclude infection. 6 mm nodule over the lingula. Recommend follow-up CT 4-6 weeks.  Nonspecific adenopathy over the lower chest and upper abdomen as described most prominent over the porta hepatis with the largest node measuring 1.9 cm by short axis. No definite primary neoplasm identified, although cannot exclude lymphoma/leukemia. Consider complete chest CT for further evaluation.  Bilateral hernia repairs as described. There is a recurrent hernia just inferior to the left lower abdominal hernia repair site  containing only peritoneal fat. Tiny umbilical hernia containing only peritoneal fat.  Subcentimeter hypodensity over the upper pole left kidney too small to characterize but likely a cyst.  Atherosclerotic coronary artery disease.  Aortic Atherosclerosis  CT Chest without Contrast (03/03/2017) - personally reviewed and discussed with patient No additional pathologic appearing adenopathy is identified within the upper mediastinum or perihilar regions. The borderline enlarged lymph node in the sub-carinal region, measuring 11 mm short axis, was better seen on today's earlier contrast-enhanced CT. The lymphadenopathy within the upper abdomen, again better demonstrated on today's earlier contrast-enhanced CT abdomen, is suspicious for neoplastic lymphadenopathy, possibly lymphoma as suggested on the earlier CT report. Consider tissue sampling for definitive characterization. 2. Small patchy consolidations at each lung base, most likely atelectasis. 3. 7 mm nodular density within the lingula is favored to represent additional nodular atelectasis. 4. Coronary artery calcifications, particularly dense within the left anterior descending coronary artery. Recommend correlation with any possible associated cardiac symptoms. 5. Aortic atherosclerosis.  Assessment/Plan:  57 y.o.  female with symptoms and hypermetabolic lymphadenopathy concerning for lymphoma, but requiring tissue diagnosis for treatment, complicated by co-morbidities including DM on insulin, HTN, hypercholesterolemia, CAD 7 months s/p PCI with drug-eluting stent to LAD off Plavix for already 20 days, hepatic cirrhosis, GERD, and osteoarthritis s/p total knee replacement (2013).   - personally discussed with patient's cardiologist, Dr. Clayborn Bigness, who advises okay to continue holding Plavix another 1 - 2 weeks (as needed) for excisional biopsy of Right cervical lymphadenopathy, particularly considering it has already been held for 20 days at this time, resume as soon as able, also advises patient is moderate risk for general anesthesia due to medically managed CAD, already optimized, no further workup indicated nor likely to change workup nor peri-operative medical management  - all risks, benefits, and alternatives to open excisional biopsy of Right cervical lymphadenopathy with in-room pre-incisional ultrasound were discussed with the patient and her husband, all of their questions were answered to their expressed satisfaction, patient expresses she wishes to proceed, and informed consent was accordingly obtained.   - will plan for open excisional biopsy of Right cervical lymphadenopathy with pre-operative ultrasound guidance/marking (in OR) asap pending availability of OR and availability of patient (who requests mornings, as early as possible, and preferably Mondays if possible)  - okay to continue aspirin peri-operatively, will plan to resume Plavix post-operatively  - medical management of comorbidities as per primary medical physician and cardiology  - return to clinic 2 weeks after above planned procedure  - instructed to call if any questions or concerns  All of the above recommendations were discussed with the patient and patient's husband, and all of patient's and family's questions were answered to their  expressed satisfaction.  Thank you for the opportunity to participate in this patient's care.  -- Marilynne Drivers Rosana Hoes, MD, [place]: [facility] - Partnering for exceptional care. Office: [phone]

## 2017-05-30 NOTE — Patient Instructions (Signed)
We will call you with your surgery date and time.   Please see your blue pre-care sheet for surgery information.   Please call our office if you have questions or concerns.

## 2017-05-30 NOTE — Telephone Encounter (Signed)
Medical / anti-coagulation Clearance faxed to [facility] at this time.

## 2017-05-30 NOTE — Progress Notes (Signed)
Surgical Clinic History and Physical  Referring provider:  Rubye [address] [place], [place] [address]  HISTORY OF PRESENT ILLNESS (HPI):  57 y.o. female presents upon referral for excisional biopsy of Right cervical lymphadenopathy. Patient reports she began experiencing lethargy, nausea, and fevers 4 months ago, since which she has also lost 40 lbs. Workup including PET-CT revealed Right cervical and retroperitoneal lymphadenopathy concerning for lymphoma. However, subsequent CT-guided retroperitoneal core needle biopsy was non-diagnostic, and ultrasound-guided biopsy of Right cervical lymphadenopathy could not be performed due to proximity of neck vessels. Plavix prescribed following placement of drug-eluting stent to LAD (09/2016) was stopped 1/3 for attempted biopsy and has not since been restarted. Patient otherwise denies CP or SOB.  PAST MEDICAL HISTORY (PMH):  Past Medical History:  Diagnosis Date  . Allergic rhinitis 02/15/2015  . Arthritis   . BP (high blood pressure) 02/15/2015  . Cirrhosis ([place])   . Diabetes mellitus   . Elevated liver enzymes 09/23/2015  . Elevated liver enzymes 09/23/2015  . GERD (gastroesophageal reflux disease)   . Gonalgia 02/15/2015  . Hernia, lateral ventral 11/19/2012  . Hypertension   . Intra-abdominal and pelvic swelling, mass and lump, unspecified site 11/19/2012     PAST SURGICAL HISTORY [facility]):  Past Surgical History:  Procedure Laterality Date  . ABDOMINAL HYSTERECTOMY     2009  . CHOLECYSTECTOMY  1997  . CORONARY ANGIOPLASTY    . CORONARY STENT INTERVENTION N/A 09/13/2016   Procedure: Coronary Stent Intervention;  Surgeon: Yolonda Kida, MD;  Location: [place] [facility];  Service: Cardiovascular;  Laterality: N/A;  . GALLBLADDER SURGERY    . HERNIA REPAIR  6/76/7209   periumbilical with right ooph  . KNEE SURGERY  12/2011   total knee replacement Dr. Duayne Cal  . LEFT HEART CATH AND CORONARY  ANGIOGRAPHY N/A 09/13/2016   Procedure: Left Heart Cath and Coronary Angiography;  Surgeon: Yolonda Kida, MD;  Location: [place] [facility];  Service: Cardiovascular;  Laterality: N/A;  . TUBAL LIGATION       MEDICATIONS:  Prior to Admission medications   Medication Sig Start Date End Date Taking? Authorizing Provider  aspirin 81 MG tablet Take 1 tablet by mouth daily.   Yes [provider]  Blood Glucose Monitoring Suppl (FIFTY50 GLUCOSE METER 2.0) W/DEVICE KIT Use as directed. 03/17/14  Yes [provider]  clopidogrel (PLAVIX) 75 MG tablet Take 75 mg by mouth daily.   Yes [provider]  doxycycline (VIBRA-TABS) 100 MG tablet Take 1 tablet (100 mg total) by mouth 2 (two) times daily. 03/03/17  Yes Harvest Dark, MD  Dulaglutide (TRULICITY) 1.5 OB/0.9GG SOPN Inject into the skin. 04/02/17  Yes [provider]  famotidine (PEPCID) 40 MG tablet Take 1 tablet (40 mg total) by mouth every evening. 03/03/17 03/03/18 Yes Nance Pear, MD  glyBURIDE (DIABETA) 2.5 MG tablet Take 1 tablet by mouth daily. 10/08/13  Yes [provider]  HYDROcodone-acetaminophen (NORCO/VICODIN) 5-325 MG tablet Take 1 tablet by mouth every 4 (four) hours as needed. 03/03/17  Yes Harvest Dark, MD  insulin aspart (NOVOLOG) 100 UNIT/ML injection Take 12 units twice daily before meals 02/22/15  Yes [provider]  insulin aspart (NOVOLOG) 100 UNIT/ML injection Inject 15 Units into the skin 3 (three) times daily before meals. 09/14/16  Yes Callwood, Dwayne D, MD  insulin glargine (LANTUS) 100 UNIT/ML injection Inject 50 Units into the skin daily.   Yes [provider]  insulin lispro (  HUMALOG) 100 UNIT/ML injection Inject 15 Units into the skin 3 (three) times daily before meals.   Yes [provider]  Insulin Pen Needle (B-D UF III MINI PEN NEEDLES) 31G X 5 MM MISC Use 1 needle daily with victoza injection 02/01/15  Yes [provider]  JARDIANCE 25 MG TABS tablet Take 25 mg daily by mouth. 02/17/17  Yes [provider]  KLOR-CON 10 10 MEQ tablet TAKE 1 TABLET (10 MEQ TOTAL) BY MOUTH 2 (TWO) TIMES DAILY. 04/05/17  Yes Carles Collet M, PA-C  lisinopril (PRINIVIL,ZESTRIL) 20 MG tablet Take 1 tablet (20 mg total) by mouth daily. 09/20/15  Yes Margarita Rana, MD  metFORMIN (GLUMETZA) 1000 MG (MOD) 24 hr tablet Take 1,000 mg by mouth daily with breakfast.     Yes [provider]  metoprolol succinate (TOPROL-XL) 25 MG 24 hr tablet Take 1 tablet (25 mg total) by mouth daily. Take with or immediately following a meal. 09/15/16  Yes Callwood, Dwayne D, MD  MULTIPLE VITAMINS-MINERALS PO Take 1 tablet by mouth daily.   Yes [provider]  prochlorperazine (COMPAZINE) 10 MG tablet Take 1 tablet (10 mg total) every 6 (six) hours as needed by mouth for nausea or vomiting. 03/23/17  Yes Lloyd Huger, MD  terconazole (TERAZOL 7) 0.4 % vaginal cream Place 1 applicator vaginally at bedtime. 03/05/17  Yes Carles Collet M, PA-C  ursodiol (ACTIGALL) 300 MG capsule Take 300 mg by mouth 2 (two) times daily.   Yes [provider]  atorvastatin (LIPITOR) 80 MG tablet Take 1 tablet (80 mg total) by mouth daily at 6 PM. 09/14/16 10/14/16  Callwood, Dwayne D, MD  isosorbide mononitrate (IMDUR) 60 MG 24 hr tablet Take 1 tablet (60 mg total) by mouth daily. 09/15/16 10/15/16  Callwood, Loran Senters, MD     ALLERGIES:  Allergies  Allergen Reactions  . Amoxicillin-Pot Clavulanate   . Ciprofloxacin   . Clarithromycin      SOCIAL HISTORY:  Social History   Socioeconomic History  . Marital status: Married    Spouse name: Not on file  . Number of children: 1  . Years of education: Not on file  . Highest education level: Not on file  Social Needs  . Financial resource strain: Not on file  . Food insecurity - worry: Not on file  . Food insecurity - inability: Not on file  . Transportation needs -  medical: Not on file  . Transportation needs - non-medical: Not on file  Occupational History  . Not on file  Tobacco Use  . Smoking status: Never Smoker  . Smokeless tobacco: Never Used  Substance and Sexual Activity  . Alcohol use: No  . Drug use: No  . Sexual activity: Not on file  Other Topics Concern  . Not on file  Social History Narrative  . Not on file    The patient currently resides (home / rehab facility / nursing home): Home The patient normally is (ambulatory / bedbound): Ambulatory  FAMILY HISTORY:  Family History  Problem Relation Age of Onset  . Diabetes Sister   . Diabetes Sister   . Diabetes Sister   . Diabetes Mother   . Heart disease Father     Otherwise negative/non-contributory.  REVIEW OF SYSTEMS:  Constitutional: fever and weight loss as per HPI  Eyes: denies any other vision changes, history of eye injury  ENT: denies sore throat, hearing problems  Respiratory: denies shortness of breath, wheezing  Cardiovascular: denies  chest pain, palpitations  Gastrointestinal: nausea as per HPI; denies abdominal pain, diarrhea, or constipation Musculoskeletal: denies any other joint pains or cramps  Skin: Denies any other rashes or skin discolorations Neurological: denies any other headache, dizziness, weakness  Psychiatric: Denies any other depression, anxiety   All other review of systems were otherwise negative   VITAL SIGNS:  BP 124/73   Pulse 79   Temp 99.3 F (37.4 C) (Oral)   Ht _0  (1.753 m)   Wt 190 lb 9.6 oz (86.5 kg)   BMI 28.15 kg/m   PHYSICAL EXAM:  Constitutional:  -- Normal body habitus  -- Awake, alert, and oriented x3  Eyes:  -- Pupils equally round and reactive to light  -- No scleral icterus  Ear, nose, throat:  -- No jugular venous distension -- No nasal drainage, bleeding -- Right cervical lymphadenopathy non-palpable, but enlarged lymph node corresponding to location of hypermetabolic Right cervical lymphadenopathy  visualized on bedside in-office Right neck ultrasound Pulmonary:  -- No crackles  -- Equal breath sounds bilaterally -- Breathing non-labored at rest Cardiovascular:  -- S1, S2 present  -- No pericardial rubs  Gastrointestinal:  -- Abdomen soft, nontender, non-distended, no guarding/rebound  -- No abdominal masses appreciated, pulsatile or otherwise  Musculoskeletal and Integumentary:  -- Wounds or skin discoloration: None appreciated -- Extremities: B/L UE and LE FROM, hands and feet warm, no edema  Neurologic:  -- Motor function: Intact and symmetric -- Sensation: Intact and symmetric  Labs:  CBC Latest Ref Rng & Units 05/23/2017 04/09/2017 04/05/2017  WBC 3.6 - 11.0 K/uL 6.6 7.7 5.4  Hemoglobin 12.0 - 16.0 g/dL 12.1 11.7(L) 11.2(L)  Hematocrit 35.0 - 47.0 % 38.3 36.8 35.1  Platelets 150 - 440 K/uL 188 189 171   CMP Latest Ref Rng & Units 03/03/2017 02/26/2017 01/12/2016  Glucose 65 - 99 mg/dL 208(H) 196(H) -  BUN 6 - 20 mg/dL 12 10 -  Creatinine 0.44 - 1.00 mg/dL 0.65 0.65 0.70  Sodium 135 - 145 mmol/L 134(L) 136 -  Potassium 3.5 - 5.1 mmol/L 3.0(L) 3.3(L) -  Chloride 101 - 111 mmol/L 96(L) 100(L) -  CO2 22 - 32 mmol/L 26 26 -  Calcium 8.9 - 10.3 mg/dL 9.0 9.0 -  Total Protein 6.5 - 8.1 g/dL 9.1(H) 8.5(H) -  Total Bilirubin 0.3 - 1.2 mg/dL 0.9 0.8 -  Alkaline Phos 38 - 126 U/L 573(H) 430(H) -  AST 15 - 41 U/L 87(H) 104(H) -  ALT 14 - 54 U/L 114(H) 134(H) -    Imaging studies:  PET-CT (03/21/2017) - personally reviewed with patient and her husband in office today 1. Hypermetabolic adenopathy in the neck and abdomen, most indicative of lymphoma. 2. Aortic atherosclerosis (ICD10-170.0). Three-vessel coronary artery calcification. 3. Bilateral inguinal hernia repairs with a large fat containing left inguinal hernia.  CT Abdomen and Pelvis with Contrast (03/03/2017) - personally reviewed and discussed with patient No acute findings in abdomen/pelvis.  Heterogeneous  opacification over the lung bases left greater than right likely atelectasis, although cannot exclude infection. 6 mm nodule over the lingula. Recommend follow-up CT 4-6 weeks.  Nonspecific adenopathy over the lower chest and upper abdomen as described most prominent over the porta hepatis with the largest node measuring 1.9 cm by short axis. No definite primary neoplasm identified, although cannot exclude lymphoma/leukemia. Consider complete chest CT for further evaluation.  Bilateral hernia repairs as described. There is a recurrent hernia just inferior to the left lower abdominal hernia repair site  containing only peritoneal fat. Tiny umbilical hernia containing only peritoneal fat.  Subcentimeter hypodensity over the upper pole left kidney too small to characterize but likely a cyst.  Atherosclerotic coronary artery disease.  Aortic Atherosclerosis  CT Chest without Contrast (03/03/2017) - personally reviewed and discussed with patient No additional pathologic appearing adenopathy is identified within the upper mediastinum or perihilar regions. The borderline enlarged lymph node in the sub-carinal region, measuring 11 mm short axis, was better seen on today's earlier contrast-enhanced CT. The lymphadenopathy within the upper abdomen, again better demonstrated on today's earlier contrast-enhanced CT abdomen, is suspicious for neoplastic lymphadenopathy, possibly lymphoma as suggested on the earlier CT report. Consider tissue sampling for definitive characterization. 2. Small patchy consolidations at each lung base, most likely atelectasis. 3. 7 mm nodular density within the lingula is favored to represent additional nodular atelectasis. 4. Coronary artery calcifications, particularly dense within the left anterior descending coronary artery. Recommend correlation with any possible associated cardiac symptoms. 5. Aortic atherosclerosis.  Assessment/Plan:  57 y.o.  female with symptoms and hypermetabolic lymphadenopathy concerning for lymphoma, but requiring tissue diagnosis for treatment, complicated by co-morbidities including DM on insulin, HTN, hypercholesterolemia, CAD 7 months s/p PCI with drug-eluting stent to LAD off Plavix for already 20 days, hepatic cirrhosis, GERD, and osteoarthritis s/p total knee replacement (2013).   - personally discussed with patient's cardiologist, Dr. Clayborn Bigness, who advises okay to continue holding Plavix another 1 - 2 weeks (as needed) for excisional biopsy of Right cervical lymphadenopathy, particularly considering it has already been held for 20 days at this time, resume as soon as able, also advises patient is moderate risk for general anesthesia due to medically managed CAD, already optimized, no further workup indicated nor likely to change workup nor peri-operative medical management  - all risks, benefits, and alternatives to open excisional biopsy of Right cervical lymphadenopathy with in-room pre-incisional ultrasound were discussed with the patient and her husband, all of their questions were answered to their expressed satisfaction, patient expresses she wishes to proceed, and informed consent was accordingly obtained.   - will plan for open excisional biopsy of Right cervical lymphadenopathy with pre-operative ultrasound guidance/marking (in OR) asap pending availability of OR and availability of patient (who requests mornings, as early as possible, and preferably Mondays if possible)  - okay to continue aspirin peri-operatively, will plan to resume Plavix post-operatively  - medical management of comorbidities as per primary medical physician and cardiology  - return to clinic 2 weeks after above planned procedure  - instructed to call if any questions or concerns  All of the above recommendations were discussed with the patient and patient's husband, and all of patient's and family's questions were answered to their  expressed satisfaction.  Thank you for the opportunity to participate in this patient's care.  -- Marilynne Drivers Rosana Hoes, MD, [place]: [facility] - Partnering for exceptional care. Office: [phone]

## 2017-05-31 ENCOUNTER — Other Ambulatory Visit: Payer: Self-pay

## 2017-05-31 ENCOUNTER — Telehealth: Payer: Self-pay | Admitting: Surgery

## 2017-05-31 ENCOUNTER — Encounter
Admission: RE | Admit: 2017-05-31 | Discharge: 2017-05-31 | Disposition: A | Discharge status: Home / Self Care | Payer: 59 | Source: Ambulatory Visit | Attending: Surgery | Admitting: Surgery

## 2017-05-31 DIAGNOSIS — Z01810 Encounter for preprocedural cardiovascular examination: Secondary | ICD-10-CM | POA: Insufficient documentation

## 2017-05-31 DIAGNOSIS — Z01812 Encounter for preprocedural laboratory examination: Secondary | ICD-10-CM | POA: Diagnosis present

## 2017-05-31 DIAGNOSIS — I10 Essential (primary) hypertension: Secondary | ICD-10-CM | POA: Insufficient documentation

## 2017-05-31 LAB — COMPREHENSIVE METABOLIC PANEL
ALT: 93 U/L — ABNORMAL HIGH (ref 14–54)
AST: 99 U/L — ABNORMAL HIGH (ref 15–41)
Albumin: 3.5 g/dL (ref 3.5–5.0)
Alkaline Phosphatase: 593 U/L — ABNORMAL HIGH (ref 38–126)
Anion gap: 7 (ref 5–15)
BUN: 19 mg/dL (ref 6–20)
CO2: 28 mmol/L (ref 22–32)
Calcium: 9.2 mg/dL (ref 8.9–10.3)
Chloride: 99 mmol/L — ABNORMAL LOW (ref 101–111)
Creatinine, Ser: 0.69 mg/dL (ref 0.44–1.00)
GFR calc Af Amer: >60 mL/min (ref >60)
GFR calc non Af Amer: >60 mL/min (ref >60)
Glucose, Bld: 142 mg/dL — ABNORMAL HIGH (ref 65–99)
Potassium: 3.5 mmol/L (ref 3.5–5.1)
Sodium: 134 mmol/L — ABNORMAL LOW (ref 135–145)
Total Bilirubin: 1 mg/dL (ref 0.3–1.2)
Total Protein: 9.3 g/dL — ABNORMAL HIGH (ref 6.5–8.1)

## 2017-05-31 NOTE — Patient Instructions (Signed)
Your procedure is scheduled on: Mon 06/04/17 Report to Day Surgery. To find out your arrival time please call [phone] between 1PM - 3PM on Friday 06/01/17.  Remember: Instructions that are not followed completely may result in serious medical risk, up to and including death, or upon the discretion of your surgeon and anesthesiologist your surgery may need to be rescheduled.     _X__ 1. Do not eat food after midnight the night before your procedure.                 No gum chewing or hard candies. You may drink clear liquids up to 2 hours                 before you are scheduled to arrive for your surgery- DO not drink clear                 liquids within 2 hours of the start of your surgery.                 Clear Liquids include:  water, apple juice without pulp, clear carbohydrate                 drink such as Clearfast of Gartorade, Black Coffee or Tea (Do not add                 anything to coffee or tea).     _X__ 2.  No Alcohol for 24 hours before or after surgery.   __ 3.  Do Not Smoke or use e-cigarettes For 24 Hours Prior to Your Surgery.                 Do not use any chewable tobacco products for at least 6 hours prior to                 surgery.  ____  4.  Bring all medications with you on the day of surgery if instructed.   __x__  5.  Notify your doctor if there is any change in your medical condition      (cold, fever, infections).     Do not wear jewelry, make-up, hairpins, clips or nail polish. Do not wear lotions, powders, or perfumes. You may wear deodorant. Do not shave 48 hours prior to surgery. Men may shave face and neck. Do not bring valuables to the hospital.    [facility] is not responsible for any belongings or valuables.  Contacts, dentures or bridgework may not be worn into surgery. Leave your suitcase in the car. After surgery it may be brought to your room. For patients admitted to the hospital, discharge time is determined by  your treatment team.   Patients discharged the day of surgery will not be allowed to drive home.   Please read over the following fact sheets that you were given:    __x__ Take these medicines the morning of surgery with A SIP OF WATER:    1. famotidine (PEPCID) 40 MG tablet  2. metoprolol succinate (TOPROL-XL) 25 MG 24 hr tablet  3.   4.  5.  6.  ____ Fleet Enema (as directed)   _x___ Use CHG Soap as directed  ____ Use inhalers on the day of surgery  ____ Stop metformin 2 days prior to surgery    __x__ Take 1/2 of usual insulin dose the night before surgery. No insulin the morning          of surgery.  __x__ continue aspirin/ {Plavix already Dc'd  ____ Stop Anti-inflammatories on    ____ Stop supplements until after surgery.    ____ Bring C-Pap to the hospital.

## 2017-05-31 NOTE — Addendum Note (Signed)
Addended by: Priscille Heidelberg on: 05/31/2017 11:43 AM   Modules accepted: Orders, SmartSet

## 2017-05-31 NOTE — Telephone Encounter (Signed)
Pt advised of pre op date/time and sx date. Sx: 06/04/17 with Dr Marcy Siren excisional bx of right cervical lymph. Pre op: 05/31/17 @ 2:30pm--office interview.   Patient made aware to call [phone], between 1-3:00pm the day before surgery, to find out what time to arrive.

## 2017-06-03 MED ORDER — VANCOMYCIN HCL IN DEXTROSE 1-5 GM/200ML-% IV SOLN
1000.0000 mg | INTRAVENOUS | Status: AC
  Administered 2017-06-04: 1000 mg via INTRAVENOUS

## 2017-06-04 ENCOUNTER — Other Ambulatory Visit: Payer: Self-pay

## 2017-06-04 ENCOUNTER — Encounter
Admission: RE | Disposition: A | Discharge status: Home / Self Care | Payer: Self-pay | Source: Ambulatory Visit | Attending: Surgery

## 2017-06-04 ENCOUNTER — Encounter: Payer: Self-pay | Admitting: *Deleted

## 2017-06-04 ENCOUNTER — Ambulatory Visit
Admission: RE | Admit: 2017-06-04 | Discharge: 2017-06-04 | Disposition: A | Discharge status: Home / Self Care | Payer: 59 | Source: Ambulatory Visit | Attending: Surgery | Admitting: Surgery

## 2017-06-04 ENCOUNTER — Ambulatory Visit: Payer: 59 | Admitting: Anesthesiology

## 2017-06-04 DIAGNOSIS — I10 Essential (primary) hypertension: Secondary | ICD-10-CM | POA: Insufficient documentation

## 2017-06-04 DIAGNOSIS — Z880 Allergy status to penicillin: Secondary | ICD-10-CM | POA: Diagnosis not present

## 2017-06-04 DIAGNOSIS — Z794 Long term (current) use of insulin: Secondary | ICD-10-CM | POA: Insufficient documentation

## 2017-06-04 DIAGNOSIS — K219 Gastro-esophageal reflux disease without esophagitis: Secondary | ICD-10-CM | POA: Insufficient documentation

## 2017-06-04 DIAGNOSIS — Z7902 Long term (current) use of antithrombotics/antiplatelets: Secondary | ICD-10-CM | POA: Diagnosis not present

## 2017-06-04 DIAGNOSIS — K7460 Unspecified cirrhosis of liver: Secondary | ICD-10-CM | POA: Insufficient documentation

## 2017-06-04 DIAGNOSIS — R591 Generalized enlarged lymph nodes: Secondary | ICD-10-CM | POA: Diagnosis not present

## 2017-06-04 DIAGNOSIS — R599 Enlarged lymph nodes, unspecified: Secondary | ICD-10-CM | POA: Diagnosis not present

## 2017-06-04 DIAGNOSIS — Z881 Allergy status to other antibiotic agents status: Secondary | ICD-10-CM | POA: Insufficient documentation

## 2017-06-04 DIAGNOSIS — E7800 Pure hypercholesterolemia, unspecified: Secondary | ICD-10-CM | POA: Insufficient documentation

## 2017-06-04 DIAGNOSIS — Z7982 Long term (current) use of aspirin: Secondary | ICD-10-CM | POA: Insufficient documentation

## 2017-06-04 DIAGNOSIS — E119 Type 2 diabetes mellitus without complications: Secondary | ICD-10-CM | POA: Insufficient documentation

## 2017-06-04 DIAGNOSIS — Z79899 Other long term (current) drug therapy: Secondary | ICD-10-CM | POA: Diagnosis not present

## 2017-06-04 DIAGNOSIS — M1990 Unspecified osteoarthritis, unspecified site: Secondary | ICD-10-CM | POA: Diagnosis not present

## 2017-06-04 DIAGNOSIS — Z955 Presence of coronary angioplasty implant and graft: Secondary | ICD-10-CM | POA: Insufficient documentation

## 2017-06-04 DIAGNOSIS — R590 Localized enlarged lymph nodes: Secondary | ICD-10-CM | POA: Diagnosis present

## 2017-06-04 HISTORY — PX: LYMPH GLAND EXCISION: SHX13

## 2017-06-04 LAB — GLUCOSE, CAPILLARY
Glucose-Capillary: 107 mg/dL — ABNORMAL HIGH (ref 65–99)
Glucose-Capillary: 86 mg/dL (ref 65–99)

## 2017-06-04 SURGERY — EXCISION, LYMPH NODE, CERVICAL
Anesthesia: General | Wound class: Clean

## 2017-06-04 MED ORDER — ROCURONIUM BROMIDE 100 MG/10ML IV SOLN
INTRAVENOUS | Status: DC | PRN
  Administered 2017-06-04: 50 mg via INTRAVENOUS

## 2017-06-04 MED ORDER — MIDAZOLAM HCL 2 MG/2ML IJ SOLN
INTRAMUSCULAR | Status: DC | PRN
  Administered 2017-06-04: 2 mg via INTRAVENOUS

## 2017-06-04 MED ORDER — OXYCODONE HCL 5 MG PO TABS: 5.0000 mg | ORAL_TABLET | Freq: Once | ORAL | Status: DC | PRN

## 2017-06-04 MED ORDER — FENTANYL CITRATE (PF) 100 MCG/2ML IJ SOLN
25.0000 ug | INTRAMUSCULAR | Status: DC | PRN
  Administered 2017-06-04 (×2): 50 ug via INTRAVENOUS

## 2017-06-04 MED ORDER — BUPIVACAINE HCL (PF) 0.5 % IJ SOLN
INTRAMUSCULAR | Status: AC
  Filled 2017-06-04: qty 30

## 2017-06-04 MED ORDER — BUPIVACAINE HCL (PF) 0.5 % IJ SOLN
INTRAMUSCULAR | Status: DC | PRN
Start: 2017-06-04 — End: 2017-06-04
  Administered 2017-06-04: 5 mL

## 2017-06-04 MED ORDER — FENTANYL CITRATE (PF) 100 MCG/2ML IJ SOLN
INTRAMUSCULAR | Status: AC
  Filled 2017-06-04: qty 2

## 2017-06-04 MED ORDER — PROPOFOL 10 MG/ML IV BOLUS
INTRAVENOUS | Status: DC | PRN
  Administered 2017-06-04: 150 mg via INTRAVENOUS

## 2017-06-04 MED ORDER — FENTANYL CITRATE (PF) 100 MCG/2ML IJ SOLN
INTRAMUSCULAR | Status: DC
Start: 2017-06-04 — End: 2017-06-04
  Filled 2017-06-04: qty 2

## 2017-06-04 MED ORDER — FENTANYL CITRATE (PF) 100 MCG/2ML IJ SOLN
INTRAMUSCULAR | Status: DC | PRN
  Administered 2017-06-04: 25 ug via INTRAVENOUS
  Administered 2017-06-04: 50 ug via INTRAVENOUS
  Administered 2017-06-04: 25 ug via INTRAVENOUS

## 2017-06-04 MED ORDER — ONDANSETRON HCL 4 MG/2ML IJ SOLN
INTRAMUSCULAR | Status: DC | PRN
  Administered 2017-06-04: 4 mg via INTRAVENOUS

## 2017-06-04 MED ORDER — LIDOCAINE HCL (CARDIAC) 20 MG/ML IV SOLN
INTRAVENOUS | Status: DC | PRN
  Administered 2017-06-04: 100 mg via INTRAVENOUS

## 2017-06-04 MED ORDER — HYDROCODONE-ACETAMINOPHEN 5-325 MG PO TABS: 1.0000 | ORAL_TABLET | ORAL | Status: DC | PRN

## 2017-06-04 MED ORDER — FAMOTIDINE 20 MG PO TABS
ORAL_TABLET | ORAL | Status: AC
  Administered 2017-06-04: 20 mg
  Filled 2017-06-04: qty 1

## 2017-06-04 MED ORDER — LACTATED RINGERS IV SOLN
INTRAVENOUS | Status: DC | PRN
  Administered 2017-06-04: 07:00:00 via INTRAVENOUS

## 2017-06-04 MED ORDER — PROPOFOL 10 MG/ML IV BOLUS
INTRAVENOUS | Status: AC
  Filled 2017-06-04: qty 20

## 2017-06-04 MED ORDER — PHENYLEPHRINE HCL 10 MG/ML IJ SOLN
INTRAMUSCULAR | Status: DC | PRN
  Administered 2017-06-04 (×3): 100 ug via INTRAVENOUS
  Administered 2017-06-04: 200 ug via INTRAVENOUS
  Administered 2017-06-04 (×2): 100 ug via INTRAVENOUS

## 2017-06-04 MED ORDER — SODIUM CHLORIDE 0.9 % IV SOLN
INTRAVENOUS | Status: DC
  Administered 2017-06-04: 07:00:00 via INTRAVENOUS

## 2017-06-04 MED ORDER — CHLORHEXIDINE GLUCONATE CLOTH 2 % EX PADS: 6.0000 | MEDICATED_PAD | Freq: Once | CUTANEOUS | Status: DC

## 2017-06-04 MED ORDER — OXYCODONE-ACETAMINOPHEN 5-325 MG PO TABS: 1.0000 | ORAL_TABLET | ORAL | 0 refills | Status: DC | PRN

## 2017-06-04 MED ORDER — MIDAZOLAM HCL 2 MG/2ML IJ SOLN
INTRAMUSCULAR | Status: AC
  Filled 2017-06-04: qty 2

## 2017-06-04 MED ORDER — EPHEDRINE SULFATE 50 MG/ML IJ SOLN
INTRAMUSCULAR | Status: DC | PRN
  Administered 2017-06-04: 10 mg via INTRAVENOUS

## 2017-06-04 MED ORDER — LIDOCAINE HCL (PF) 1 % IJ SOLN
INTRAMUSCULAR | Status: DC | PRN
  Administered 2017-06-04: 5 mL

## 2017-06-04 MED ORDER — VANCOMYCIN HCL IN DEXTROSE 1-5 GM/200ML-% IV SOLN
INTRAVENOUS | Status: AC
  Administered 2017-06-04: 1000 mg via INTRAVENOUS
  Filled 2017-06-04: qty 200

## 2017-06-04 MED ORDER — OXYCODONE HCL 5 MG/5ML PO SOLN: 5.0000 mg | Freq: Once | ORAL | Status: DC | PRN

## 2017-06-04 MED ORDER — LIDOCAINE HCL (PF) 1 % IJ SOLN
INTRAMUSCULAR | Status: AC
  Filled 2017-06-04: qty 30

## 2017-06-04 SURGICAL SUPPLY — 29 items
APPLIER CLIP 9.375 SM OPEN (CLIP) ×3
CANISTER SUCT 1200ML W/VALVE (MISCELLANEOUS) ×3 IMPLANT
CLIP APPLIE 9.375 SM OPEN (CLIP) ×1 IMPLANT
CLOSURE WOUND 1/4X4 (GAUZE/BANDAGES/DRESSINGS) ×1
CNTNR SPEC 2.5X3XGRAD LEK (MISCELLANEOUS) ×1
CONT SPEC 4OZ STER OR WHT (MISCELLANEOUS) ×2
CONTAINER SPEC 2.5X3XGRAD LEK (MISCELLANEOUS) ×1 IMPLANT
COVER LIGHT HANDLE STERIS (MISCELLANEOUS) ×3 IMPLANT
DRSG TEGADERM 2-3/8X2-3/4 SM (GAUZE/BANDAGES/DRESSINGS) ×3 IMPLANT
DRSG TELFA 4X3 1S NADH ST (GAUZE/BANDAGES/DRESSINGS) ×3 IMPLANT
ELECT CAUTERY NEEDLE TIP 1.0 (MISCELLANEOUS) ×3
ELECT REM PT RETURN 9FT ADLT (ELECTROSURGICAL) ×3
ELECTRODE CAUTERY NEDL TIP 1.0 (MISCELLANEOUS) ×1 IMPLANT
ELECTRODE REM PT RTRN 9FT ADLT (ELECTROSURGICAL) ×1 IMPLANT
GLOVE BIO SURGEON STRL SZ7.5 (GLOVE) ×3 IMPLANT
GLOVE INDICATOR 8.0 STRL GRN (GLOVE) ×3 IMPLANT
GOWN STRL REUS W/ TWL LRG LVL3 (GOWN DISPOSABLE) ×2 IMPLANT
GOWN STRL REUS W/TWL LRG LVL3 (GOWN DISPOSABLE) ×4
KIT RM TURNOVER STRD PROC AR (KITS) ×3 IMPLANT
LABEL OR SOLS (LABEL) ×3 IMPLANT
NEEDLE HYPO 25X1 1.5 SAFETY (NEEDLE) ×6 IMPLANT
NS IRRIG 500ML POUR BTL (IV SOLUTION) ×3 IMPLANT
PACK BASIN MINOR ARMC (MISCELLANEOUS) ×3 IMPLANT
STRIP CLOSURE SKIN 1/4X4 (GAUZE/BANDAGES/DRESSINGS) ×2 IMPLANT
SUT VIC AB 3-0 SH 27 (SUTURE) ×2
SUT VIC AB 3-0 SH 27X BRD (SUTURE) ×1 IMPLANT
SUT VIC AB 4-0 FS2 27 (SUTURE) ×3 IMPLANT
SYR 10ML LL (SYRINGE) ×6 IMPLANT
SYR BULB IRRIG 60ML STRL (SYRINGE) ×3 IMPLANT

## 2017-06-04 NOTE — OR Nursing (Signed)
Discharge instructions discussed with pt and husband. Both voice understanding. 

## 2017-06-04 NOTE — Discharge Instructions (Addendum)
In addition to included general post-operative instructions for Excisional Biopsy of Right Cervical Lymph Node,  Diet: Resume home heart healthy diet.   Activity: No heavy lifting >20 pounds (children, pets, laundry, garbage) or strenuous activity until follow-up, but light activity and walking are encouraged. Do not drive or drink alcohol if taking narcotic pain medications.  Wound care: 2 days after surgery (Wednesday, 1/30), may shower/get incision wet with soapy water and pat dry (do not rub incisions), but no baths or submerging incision underwater until follow-up.   Medications: Resume all home medications, including Plavix TOMORROW (day after surgery). For mild to moderate pain: acetaminophen (Tylenol) or ibuprofen/naproxen (if no kidney disease). Combining Tylenol with alcohol can substantially increase your risk of causing liver disease. Narcotic pain medications, if prescribed, can be used for severe pain, though may cause nausea, constipation, and drowsiness. Do not combine Tylenol and Percocet (or similar) within a 6 hour period as Percocet (and similar) contain(s) Tylenol. If you do not need the narcotic pain medication, you do not need to fill the prescription.  Call office [phone]) at any time if any questions, worsening pain, fevers/chills, bleeding, drainage from incision site, or other concerns.    AMBULATORY SURGERY  DISCHARGE INSTRUCTIONS   1) The drugs that you were given will stay in your system until tomorrow so for the next 24 hours you should not:  A) Drive an automobile B) Make any legal decisions C) Drink any alcoholic beverage   2) You may resume regular meals tomorrow.  Today it is better to start with liquids and gradually work up to solid foods.  You may eat anything you prefer, but it is better to start with liquids, then soup and crackers, and gradually work up to solid foods.   3) Please notify your doctor immediately if you have any unusual  bleeding, trouble breathing, redness and pain at the surgery site, drainage, fever, or pain not relieved by medication.    4) Additional Instructions:        Please contact your physician with any problems or Same Day Surgery at [phone], Monday through Friday 6 am to 4 pm, or [place] at [facility] number at [phone].

## 2017-06-04 NOTE — Anesthesia Preprocedure Evaluation (Signed)
Anesthesia Evaluation  Patient identified by MRN, date of birth, ID band Patient awake    Reviewed: Allergy & Precautions, H&P , NPO status , Patient's Chart, lab work & pertinent test results  History of Anesthesia Complications (+) PROLONGED EMERGENCE and history of anesthetic complications  Airway Mallampati: III  TM Distance: >3 FB Neck ROM: limited    Dental  (+) Chipped   Pulmonary neg pulmonary ROS, neg shortness of breath,           Cardiovascular Exercise Tolerance: Good hypertension, (-) angina(-) Past MI and (-) DOE      Neuro/Psych PSYCHIATRIC DISORDERS negative neurological ROS     GI/Hepatic Neg liver ROS, GERD  Medicated and Controlled,  Endo/Other  diabetes, Type 2, Insulin Dependent  Renal/GU      Musculoskeletal  (+) Arthritis ,   Abdominal   Peds  Hematology negative hematology ROS (+)   Anesthesia Other Findings Past Medical History: 02/15/2015: Allergic rhinitis No date: Arthritis 02/15/2015: BP (high blood pressure) No date: Cirrhosis ([place]) No date: Diabetes mellitus 09/23/2015: Elevated liver enzymes 09/23/2015: Elevated liver enzymes No date: GERD (gastroesophageal reflux disease) 02/15/2015: Gonalgia 11/19/2012: Hernia, lateral ventral No date: Hypertension 11/19/2012: Intra-abdominal and pelvic swelling, mass and lump,  unspecified site  Past Surgical History: No date: ABDOMINAL HYSTERECTOMY     Comment:  2009 1997: CHOLECYSTECTOMY No date: CORONARY ANGIOPLASTY     Comment:  stints 09/13/2016: CORONARY STENT INTERVENTION; N/A     Comment:  Procedure: Coronary Stent Intervention;  Surgeon:               Yolonda Kida, MD;  Location: [place] [facility];               Service: Cardiovascular;  Laterality: N/A; No date: GALLBLADDER SURGERY 11/19/2012: HERNIA REPAIR     Comment:  periumbilical with right ooph No date: JOINT REPLACEMENT     Comment:  left knee 12/2011: KNEE  SURGERY     Comment:  total knee replacement Dr. Duayne Cal 09/13/2016: LEFT HEART CATH AND CORONARY ANGIOGRAPHY; N/A     Comment:  Procedure: Left Heart Cath and Coronary Angiography;                Surgeon: Yolonda Kida, MD;  Location: [place]              [facility];  Service: Cardiovascular;  Laterality: N/A; No date: TUBAL LIGATION  BMI    Body Mass Index:  28.06 kg/m      Reproductive/Obstetrics negative OB ROS                             Anesthesia Physical Anesthesia Plan  ASA: III  Anesthesia Plan: General ETT   Post-op Pain Management:    Induction: Intravenous  PONV Risk Score and Plan: 3 and Ondansetron, Dexamethasone, Midazolam and Treatment may vary due to age or medical condition  Airway Management Planned: Oral ETT  Additional Equipment:   Intra-op Plan:   Post-operative Plan: Extubation in OR  Informed Consent: I have reviewed the patients History and Physical, chart, labs and discussed the procedure including the risks, benefits and alternatives for the proposed anesthesia with the patient or authorized representative who has indicated his/her understanding and acceptance.   Dental Advisory Given  Plan Discussed with: Anesthesiologist, CRNA and Surgeon  Anesthesia Plan Comments: (Patient consented for risks of anesthesia including but not limited to:  - adverse reactions  to medications - damage to teeth, lips or other oral mucosa - sore throat or hoarseness - Damage to heart, brain, lungs or loss of life  Patient voiced understanding.)        Anesthesia Quick Evaluation

## 2017-06-04 NOTE — Anesthesia Procedure Notes (Signed)
Procedure Name: Intubation Performed by: Gaskill Riser, CRNA Pre-anesthesia Checklist: Patient identified Patient Re-evaluated:Patient Re-evaluated prior to induction Oxygen Delivery Method: Circle system utilized and Simple face mask Preoxygenation: Pre-oxygenation with 100% oxygen Induction Type: IV induction Ventilation: Mask ventilation without difficulty Laryngoscope Size: Mac and 3 Grade View: Grade I Tube type: Oral Tube size: 7.0 mm Number of attempts: 1 Airway Equipment and Method: Stylet Placement Confirmation: ETT inserted through vocal cords under direct vision Secured at: 22 cm Tube secured with: Tape Dental Injury: Teeth and Oropharynx as per pre-operative assessment

## 2017-06-04 NOTE — Interval H&P Note (Signed)
History and Physical Interval Note:  06/04/2017 7:30 AM  Lisa [name]  has presented today for surgery, with the diagnosis of lymphadenopathy of head and neck  The various methods of treatment have been discussed with the patient and family. After consideration of risks, benefits and other options for treatment, the patient has consented to  Procedure(s): CERVICAL LYMPH NODE BIOPSY (N/A) as a surgical intervention .  The patient's history has been reviewed, patient examined, no change in status, stable for surgery.  I have reviewed the patient's chart and labs.  Questions were answered to the patient's satisfaction.     Vickie Epley

## 2017-06-04 NOTE — Transfer of Care (Signed)
Immediate Anesthesia Transfer of Care Note  Patient: Lisa [name]  Procedure(s) Performed: CERVICAL LYMPH NODE BIOPSY (N/A )  Patient Location: PACU  Anesthesia Type:General  Level of Consciousness: sedated  Airway & Oxygen Therapy: Patient Spontanous Breathing  Post-op Assessment: Report given to RN  Post vital signs: Reviewed and stable  Last Vitals:  Vitals:   06/04/17 0607 06/04/17 1059  BP: 126/74 137/77  Pulse: 77 69  Resp: 18 13  Temp: 36.6 C (!) 36.2 C  SpO2: 100% 100%    Last Pain:  Vitals:   06/04/17 1059  TempSrc:   PainSc: Asleep         Complications: No apparent anesthesia complications

## 2017-06-04 NOTE — Anesthesia Post-op Follow-up Note (Signed)
Anesthesia QCDR form completed.        

## 2017-06-04 NOTE — Op Note (Signed)
VASCULAR SURGERY OPERATIVE REPORT  DATE OF PROCEDURE: 06/04/2017  ATTENDING Surgeon(s): Vickie Epley, MD  ASSISTANT(S): Caroleen Hamman, MD  ANESTHESIA: GETA  PRE-OPERATIVE DIAGNOSIS: Right cervical hypermetabolic lymphadenopathy and retroperitoneal lymphadenopathy with 40 lbs weight loss, fevers, lethargy, and nausea, together suggestive of lymphoma requiring tissue biopsy (icd-10: R59.0)  POST-OPERATIVE DIAGNOSIS: Right cervical hypermetabolic lymphadenopathy and retroperitoneal lymphadenopathy with 40 lbs weight loss, fevers, lethargy, and nausea, together suggestive of lymphoma requiring tissue biopsy (icd-10: R59.0)  PROCEDURE(S): Open excisional biopsy of 1 cm Right cervical lymph node posterior and deep to Right internal jugular vein  INTRAOPERATIVE FINDINGS: soft but enlarged 1 cm Right cervical lymph node posterior and deep to Right internal jugular vein  INTRAOPERATIVE FLUIDS: 900 mL crystalloid  ESTIMATED BLOOD LOSS: Minimal (< 20 mL)  URINE OUTPUT: No Foley catheter  SPECIMENS: soft but enlarged 1 cm Right cervical lymph node  IMPLANTS: None  DRAINS: None  COMPLICATIONS: None apparent  CONDITION AT COMPLETION: Hemodynamically stable and extubated  DISPOSITION: PACU  INDICATION(S) FOR PROCEDURE: Patient is a 57 y.o. female with recent 40 lbs unintentional weight loss, lethargy, fevers, and nausea, who was found upon workup to have hypermetabolic Right cervical and retroperitoneal lymphadenopathy. CT-guided retroperitoneal core needle biopsy was non-diagnostic, and ultrasound-guided biopsy of Right cervical lymphadenopathy could not be performed due to proximity of neck vessels. All risks, benefits, and alternatives to above elective procedures were discussed with the patient and her husband, who together elected to proceed, and informed consent was accordingly obtained at that time.  DETAILS OF PROCEDURE: Patient was brought to the operating suite and  appropriately identified. General anesthesia and peri-operative IV antibiotics were administered, and endotracheal intubation was performed by anesthesiologist. In supine position with an towel roll between the patient's scapulae, and ultrasound was used to visualize the suspicious enlarged lymph node deep to the Right jugular vein. Operative site was then prepped and draped in the usual sterile fashion. Following a brief timeout, a 2 cm longitudinal skin incision was made along the anterior border of the sternocleidomastoid muscle at the level of the visualized lymph node and extended deep through platysma and subcutaneous tissues using blunt dissection and selective electrocaudery. The internal jugular vein was identified and dissected circumferentially along the length of the incision with care taken to avoid manipulation of or injury to the carotid arteries or vagus nerve, which were identified and protected throughout the procedure. Despite pre-incisional sonographic visualization of the suspicious lymph node, it was not able to be appreciated despite thorough examination. Incision was accordingly extended proximally by 1 cm. At this time, Dr. Dahlia Byes was passing through the OR and was asked to evaluate for a second opinion. With minimal further dissection posterior to the exposed jugular vein, a soft and normal-feeling but clearly abnormally enlarged lymph node was identified, dissected free of surrounding tissue, and all lymphatic tissue clipped with the vascular pedicle tied using 3-0 silk ties.   Hemostasis and all instrument and sponge counts were confirmed, and incision was closed in layers with 3-0 Vicryl suture for platysma and 4-0 Monocryl suture in running subcuticular fashion for skin, which was then cleaned, dried, and surgical skin glue was applied and allowed to dry. Patient was then safely able to be extubated and transferred to PACU for post-operative monitoring and care.  I was present for  all aspects of the procedure, and there were no intraoperative complications apparent.

## 2017-06-05 ENCOUNTER — Encounter: Payer: Self-pay | Admitting: Surgery

## 2017-06-05 LAB — SURGICAL PATHOLOGY

## 2017-06-05 NOTE — Anesthesia Postprocedure Evaluation (Signed)
Anesthesia Post Note  Patient: Lisa [name]  Procedure(s) Performed: CERVICAL LYMPH NODE BIOPSY (N/A )  Patient location during evaluation: PACU Anesthesia Type: General Level of consciousness: awake and alert Pain management: pain level controlled Vital Signs Assessment: post-procedure vital signs reviewed and stable Respiratory status: spontaneous breathing, nonlabored ventilation, respiratory function stable and patient connected to nasal cannula oxygen Cardiovascular status: blood pressure returned to baseline and stable Postop Assessment: no apparent nausea or vomiting Anesthetic complications: no     Last Vitals:  Vitals:   06/04/17 1207 06/04/17 1249  BP: (!) 152/70 105/86  Pulse: 65   Resp: 17   Temp:    SpO2: 100% 100%    Last Pain:  Vitals:   06/05/17 1249  TempSrc:   PainSc: 8                  Joseph K Piscitello

## 2017-06-09 NOTE — Progress Notes (Signed)
[place]  [phone] [phone]) [phone]  ID: Lisa [name] OB: 05-19-1960  MR#: [id number]  YBO#:[id number]  Patient Care Team: Rubye Beach as PCP - General (Family Medicine)  CHIEF COMPLAINT: Lymphadenopathy.  INTERVAL HISTORY: Patient returns to clinic today for further evaluation and discussion of her biopsy results.  She states her appetite has improved and she no longer has weight loss.  She continues to have weakness and fatigue, but admits this has improved as well.  She does not complain of nausea today.  She denies any recent fevers.  She has no neurologic complaints.  She has no chest pain or shortness of breath.  She denies any constipation or diarrhea. She has no urinary complaints.  Patient offers no further specific complaints today.  REVIEW OF SYSTEMS:   Review of Systems  Constitutional: Positive for malaise/fatigue. Negative for chills, diaphoresis, fever and weight loss.  Respiratory: Negative.  Negative for cough, hemoptysis and shortness of breath.   Cardiovascular: Negative.  Negative for chest pain and leg swelling.  Gastrointestinal: Negative.  Negative for abdominal pain, constipation, diarrhea, nausea and vomiting.  Genitourinary: Negative.  Negative for hematuria.  Musculoskeletal: Negative.   Skin: Negative.  Negative for rash.  Neurological: Positive for weakness.  Psychiatric/Behavioral: The patient is nervous/anxious.     As per HPI. Otherwise, a complete review of systems is negative.  PAST MEDICAL HISTORY: Past Medical History:  Diagnosis Date  . Allergic rhinitis 02/15/2015  . Arthritis   . BP (high blood pressure) 02/15/2015  . Cirrhosis ([place])   . Diabetes mellitus   . Elevated liver enzymes 09/23/2015  . Elevated liver enzymes 09/23/2015  . GERD (gastroesophageal reflux disease)   . Gonalgia 02/15/2015  . Hernia, lateral ventral 11/19/2012  . Hypertension   . Intra-abdominal and pelvic  swelling, mass and lump, unspecified site 11/19/2012    PAST SURGICAL HISTORY: Past Surgical History:  Procedure Laterality Date  . ABDOMINAL HYSTERECTOMY     2009  . CHOLECYSTECTOMY  1997  . CORONARY ANGIOPLASTY     stints  . CORONARY STENT INTERVENTION N/A 09/13/2016   Procedure: Coronary Stent Intervention;  Surgeon: Yolonda Kida, MD;  Location: [facility];  Service: Cardiovascular;  Laterality: N/A;  . GALLBLADDER SURGERY    . HERNIA REPAIR  2/77/8242   periumbilical with right ooph  . JOINT REPLACEMENT     left knee  . KNEE SURGERY  12/2011   total knee replacement Dr. Duayne Cal  . LEFT HEART CATH AND CORONARY ANGIOGRAPHY N/A 09/13/2016   Procedure: Left Heart Cath and Coronary Angiography;  Surgeon: Yolonda Kida, MD;  Location: [facility];  Service: Cardiovascular;  Laterality: N/A;  . LYMPH GLAND EXCISION N/A 06/04/2017   Procedure: CERVICAL LYMPH NODE BIOPSY;  Surgeon: Vickie Epley, MD;  Location: [facility];  Service: General;  Laterality: N/A;  . TUBAL LIGATION      FAMILY HISTORY: Family History  Problem Relation Age of Onset  . Diabetes Sister   . Diabetes Sister   . Diabetes Sister   . Diabetes Mother   . Heart disease Father     ADVANCED DIRECTIVES (Y/N):  N  HEALTH MAINTENANCE: Social History   Tobacco Use  . Smoking status: Never Smoker  . Smokeless tobacco: Never Used  Substance Use Topics  . Alcohol use: No  . Drug use: No     Colonoscopy:  PAP:  Bone density:  Lipid panel:  Allergies  Allergen  Reactions  . Amoxicillin-Pot Clavulanate Swelling    lips  . Ciprofloxacin Swelling    lips  . Clarithromycin Swelling    lips    Current Outpatient Medications  Medication Sig Dispense Refill  . aspirin 81 MG tablet Take 1 tablet by mouth daily.    . Blood Glucose Monitoring Suppl (FIFTY50 GLUCOSE METER 2.0) W/DEVICE KIT Use as directed.    . clopidogrel (PLAVIX) 75 MG tablet Take 75 mg by mouth daily.    .  famotidine (PEPCID) 40 MG tablet Take 1 tablet (40 mg total) by mouth every evening. 30 tablet 1  . glyBURIDE (DIABETA) 2.5 MG tablet Take 1 tablet by mouth daily.    . insulin glargine (LANTUS) 100 UNIT/ML injection Inject 50 Units into the skin daily.    Marland Kitchen JARDIANCE 25 MG TABS tablet Take 25 mg daily by mouth.  6  . KLOR-CON 10 10 MEQ tablet TAKE 1 TABLET (10 MEQ TOTAL) BY MOUTH 2 (TWO) TIMES DAILY. 60 tablet 0  . lisinopril (PRINIVIL,ZESTRIL) 20 MG tablet Take 1 tablet (20 mg total) by mouth daily. 90 tablet 3  . metoprolol succinate (TOPROL-XL) 25 MG 24 hr tablet Take 1 tablet (25 mg total) by mouth daily. Take with or immediately following a meal. 30 tablet 6  . MULTIPLE VITAMINS-MINERALS PO Take 1 tablet by mouth daily.    Marland Kitchen terconazole (TERAZOL 7) 0.4 % vaginal cream Place 1 applicator vaginally at bedtime. 45 g 0  . ursodiol (ACTIGALL) 300 MG capsule Take 300 mg by mouth daily.     Marland Kitchen atorvastatin (LIPITOR) 80 MG tablet Take 1 tablet (80 mg total) by mouth daily at 6 PM. 30 tablet 12  . isosorbide mononitrate (IMDUR) 60 MG 24 hr tablet Take 1 tablet (60 mg total) by mouth daily. (Patient not taking: Reported on 06/04/2017) 30 tablet 6   No current facility-administered medications for this visit.     OBJECTIVE: Vitals:   06/12/17 1130  BP: 136/76  Pulse: 82  Resp: 20  Temp: (!) 96 F (35.6 C)     Body mass index is 28.31 kg/m.    ECOG FS:1 - Symptomatic but completely ambulatory  General: Well-developed, well-nourished, no acute distress. Eyes: Pink conjunctiva, anicteric sclera. Lungs: Clear to auscultation bilaterally. Heart: Regular rate and rhythm. No rubs, murmurs, or gallops. Abdomen: Soft, nontender, nondistended. No organomegaly noted, normoactive bowel sounds. Musculoskeletal: No edema, cyanosis, or clubbing. Neuro: Alert, answering all questions appropriately. Cranial nerves grossly intact. Skin: No rashes or petechiae noted. Psych: Normal affect. Lymphatics: No  palpable cervical, calvicular, axillary or inguinal LAD.   LAB RESULTS:  Lab Results  Component Value Date   NA 134 (L) 05/31/2017   K 3.5 05/31/2017   CL 99 (L) 05/31/2017   CO2 28 05/31/2017   GLUCOSE 142 (H) 05/31/2017   BUN 19 05/31/2017   CREATININE 0.69 05/31/2017   CALCIUM 9.2 05/31/2017   PROT 9.3 (H) 05/31/2017   ALBUMIN 3.5 05/31/2017   AST 99 (H) 05/31/2017   ALT 93 (H) 05/31/2017   ALKPHOS 593 (H) 05/31/2017   BILITOT 1.0 05/31/2017   GFRNONAA >60 05/31/2017   GFRAA >60 05/31/2017    Lab Results  Component Value Date   WBC 6.6 05/23/2017   NEUTROABS 5.8 04/09/2017   HGB 12.1 05/23/2017   HCT 38.3 05/23/2017   MCV 78.5 (L) 05/23/2017   PLT 188 05/23/2017     STUDIES: [place] Core Biopsy (lymph Nodes)  Result Date: 05/23/2017 CLINICAL DATA:  Hypermetabolic retroperitoneal and right cervical adenopathy. Inadequate biopsy from retroperitoneal adenopathy. EXAM: ULTRASOUND GUIDED CORE BIOPSY OF RIGHT CERVICAL ADENOPATHY (ATTEMPTED) MEDICATIONS: Intravenous Fentanyl and Versed were administered as conscious sedation during continuous monitoring of the patient's level of consciousness and physiological / cardiorespiratory status by the radiology RN, with a total moderate sedation time of 20 minutes. PROCEDURE: The procedure, risks, benefits, and alternatives were explained to the patient. Questions regarding the procedure were encouraged and answered. The patient understands and consents to the procedure. Survey ultrasound of the right neck was performed. A level 3 hypoechoic 1.4 x 0.6 cm node was identified anterior to the common carotid artery corresponding to the hypermetabolic focus on recent PET-CT. An appropriate skin entry site was determined and marked. The operative field was prepped with chlorhexidine in a sterile fashion, and a sterile drape was applied covering the operative field. A sterile gown and sterile gloves were used for the procedure. Local anesthesia was  provided with 1% Lidocaine. The node was positioned immediately superficial to the common carotid artery and immediately adjacent and lateral to the vagus nerve. This provided very limited window of approach. Under real-time ultrasound guidance, the 17 gauge guide needle was advanced towards the margin of the lesion. Ultimately, no adequate needle tip positioning could be achieved to allow safe throw of the automated biopsy gun even at its shortest setting of 1.3 cm due to proximity of the common carotid artery and vagus nerve. The procedure was then terminated. No immediate complication. The patient tolerated the procedure well. COMPLICATIONS: None. FINDINGS: The right level 3 pathologic adenopathy was localized. No safe approach for percutaneous biopsy however as detailed above. The procedure was terminated. IMPRESSION: 1. Technically unsuccessful core biopsy of right cervical lymph node due to anatomic factors as detailed above. Consider surgical excisional biopsy. Electronically Signed   By: Lucrezia Europe M.D.   On: 05/23/2017 17:30    ASSESSMENT: Lymphadenopathy  PLAN:    1. Lymphadenopathy: PET scan results from March 21, 2017 reviewed independently with lymphadenopathy above and below the diaphragm suspicious for lymphoma.  Patient had a CT-guided bone marrow biopsy as well as a lymph node biopsy on April 09, 2017.  Bone marrow biopsy was negative for any lymphoma.  CT biopsy was insufficient specimen to make a diagnosis.  Excisional right cervical lymph node biopsy revealed no evidence of malignancy.  After lengthy discussion with the patient, she does not wish to undergo any further biopsies but has agreed to repeat PET scan in approximately 1 month to assess for interval change.  Patient will return to clinic 1-2 days later to discuss the results.    2.  Nausea/vomiting: Improved.  Unclear etiology. Continue Compazine and Zofran as prescribed.   3.  Fevers: Patient is afebrile in clinic today.   No obvious infectious source, monitor.  Patient was instructed to ibuprofen sparingly. 4.  Microcytosis: Hemoglobinopathy profile was normal. 5.  Elevated liver enzymes: Chronic, monitor.  Approximately 30 minutes was spent in discussion of which greater than 50% was consultation.  Patient expressed understanding and was in agreement with this plan. She also understands that She can call clinic at any time with any questions, concerns, or complaints.   Cancer Staging No matching staging information was found for the patient.  Lloyd Huger, MD   06/15/2017 4:40 PM

## 2017-06-11 ENCOUNTER — Encounter: Payer: Self-pay | Admitting: Surgery

## 2017-06-11 ENCOUNTER — Ambulatory Visit (INDEPENDENT_AMBULATORY_CARE_PROVIDER_SITE_OTHER): Payer: 59 | Admitting: Surgery

## 2017-06-11 VITALS — BP 150/88 | HR 71 | Temp 98.7°F | Ht 69.0 in | Wt 190.5 lb

## 2017-06-11 DIAGNOSIS — R591 Generalized enlarged lymph nodes: Secondary | ICD-10-CM

## 2017-06-11 NOTE — Patient Instructions (Signed)

## 2017-06-11 NOTE — Progress Notes (Signed)
Surgical Clinic Progress/Follow-up Note   HPI:  57 y.o. Female presents to clinic for post-op follow-up evaluation s/p open excisional biopsy of enlarged and hypermetabolic Right cervical lymph node. Patient reports her recent fevers and lethargy have both resolved over the past 5 weeks with improved appetite and stabilization of her weight following recent unintentional weight loss ~40 lbs. Patient otherwise describes mild Right neck pain and swelling, not requiring narcotic pain medication and which has continued to improve each day. Patient otherwise denies any voice changes, CP, or SOB and says she feels better now than she has "in a while".  Review of Systems:  Constitutional: denies any other weight loss, fever, chills, or sweats  Eyes: denies any other vision changes, history of eye injury  ENT: denies sore throat, hearing problems  Respiratory: denies shortness of breath, wheezing  Cardiovascular: denies chest pain, palpitations  Gastrointestinal: denies abdominal pain, N/V, or diarrhea Musculoskeletal: denies any other joint pains or cramps  Skin: Denies any other rashes or skin discolorations except as described in HPI Neurological: denies any other headache, dizziness, weakness  Psychiatric: denies any other depression, anxiety  All other review of systems: otherwise negative   Vital Signs:  BP (!) 150/88   Pulse 71   Temp 98.7 F (37.1 C) (Oral)   Ht 5\' 9"  (1.753 m)   Wt 190 lb 8 oz (86.4 kg)   BMI 28.13 kg/m    Physical Exam:  Constitutional:  -- Normal body habitus  -- Awake, alert, and oriented x3  Eyes:  -- Pupils equally round and reactive to light  -- No scleral icterus  Ear, nose, throat:  -- No jugular venous distension  -- No nasal drainage, bleeding Pulmonary:  -- No crackles -- Equal breath sounds bilaterally -- Breathing non-labored at rest Cardiovascular:  -- S1, S2 present  -- No pericardial rubs  Gastrointestinal:  -- Soft, nontender,  non-distended, no guarding/rebound  -- No abdominal masses appreciated, pulsatile or otherwise  Musculoskeletal / Integumentary:  -- Wounds or skin discoloration: Right neck incision well-approximated and minimally tender to palpation with mild peri-incisional swelling consistent with likely seroma (though cannot exclude hematoma), no surrounding erythema, drainage, or any evidence of infection otherwise  -- Extremities: B/L UE and LE FROM, hands and feet warm, no edema  Neurologic:  -- Motor function: intact and symmetric  -- Sensation: intact and symmetric   Laboratory studies:  CBC:  Lab Results  Component Value Date   WBC 6.6 05/23/2017   RBC 4.88 05/23/2017   BMP:  Lab Results  Component Value Date   GLUCOSE 142 (H) 05/31/2017   GLUCOSE 184 (H) 12/22/2011   CO2 28 05/31/2017   CO2 28 12/22/2011   BUN 19 05/31/2017   BUN 15 09/22/2015   BUN 9 12/22/2011   CREATININE 0.69 05/31/2017   CREATININE 0.70 12/22/2011   CALCIUM 9.2 05/31/2017   CALCIUM 8.2 (L) 12/22/2011     Imaging: No new pertinent imaging studies available for review  Surgical Pathology (06/04/2017): 1.5 cm x 1 cm x 0.5 cm Right cervical lymph node: NO MORPHOLOGIC EVIDENCE OF MALIGNANCY  Note: A sample was sent for flow cytometry ([employer]/ [facility], Accession #[id number]) with the following interpretation:  No phenotypic evidence of B or T cell lymphoma.  Assessment:  56 y.o. yo Female with a problem list including...  Patient Active Problem List   Diagnosis Date Noted  . Lymphadenopathy 03/18/2017  . S/P drug eluting coronary stent placement 09/13/2016  . Elevated liver  enzymes 09/23/2015  . Diabetes mellitus ([place]) 06/02/2015  . Adaptation reaction 02/15/2015  . Allergic rhinitis 02/15/2015  . Diabetes mellitus, type 2 ([place]) 02/15/2015  . Bite from dog 02/15/2015  . Foot pain 02/15/2015  . Big thyroid 02/15/2015  . BP (high blood pressure) 02/15/2015  . Hypercholesteremia 02/15/2015   . Diabetes ([place]) 02/15/2015  . Gonalgia 02/15/2015  . LBP (low back pain) 02/15/2015  . Hernia of anterior abdominal wall 02/15/2015  . Dermatophytosis of groin 02/15/2015  . Candida vaginitis 02/15/2015  . Acid reflux 11/03/2014  . S/P TKR (total knee replacement) 07/28/2013  . Abdominal lump 11/19/2012  . Hernia, lateral ventral 11/19/2012  . Intra-abdominal and pelvic swelling, mass and lump, unspecified site 11/19/2012  . Adnexal mass 11/06/2012    presents to clinic for post-op follow-up evaluation, doing overall well with resolution vs near-resolution of recent fevers, weight loss, and lethargy, now s/p open excisional biopsy of hypermetabolic Right cervical lymphadenopathy (also hypermetabolic retroperitoneal lymphadenopathy) concerning for lymphoma without any evidence of lymphoma on pathology analysis of surgical specimen.  Plan:   - continue Plavix as previously directed  - okay to shower with water or soapy water  - warm compresses to Right neck for relief prn  - pathology discussed with patient and Dr. Grayland Ormond  - appointment with Dr. Grayland Ormond scheduled for tomorrow  - instructed to call office if any questions or concerns  - return to clinic as needed  All of the above recommendations were discussed with the patient and her daughter (present at this appointment) and all of patient's and family's questions were answered to their expressed satisfaction.  -- Marilynne Drivers Rosana Hoes, MD, [place]: [facility] - Partnering for exceptional care. Office: [phone]

## 2017-06-12 ENCOUNTER — Encounter: Payer: Self-pay | Admitting: Oncology

## 2017-06-12 ENCOUNTER — Inpatient Hospital Stay: Payer: 59 | Attending: Oncology | Admitting: Oncology

## 2017-06-12 VITALS — BP 136/76 | HR 82 | Temp 96.0°F | Resp 20 | Wt 191.7 lb

## 2017-06-12 DIAGNOSIS — Z794 Long term (current) use of insulin: Secondary | ICD-10-CM | POA: Diagnosis not present

## 2017-06-12 DIAGNOSIS — K7460 Unspecified cirrhosis of liver: Secondary | ICD-10-CM | POA: Diagnosis not present

## 2017-06-12 DIAGNOSIS — M129 Arthropathy, unspecified: Secondary | ICD-10-CM | POA: Insufficient documentation

## 2017-06-12 DIAGNOSIS — I10 Essential (primary) hypertension: Secondary | ICD-10-CM | POA: Diagnosis not present

## 2017-06-12 DIAGNOSIS — Z7982 Long term (current) use of aspirin: Secondary | ICD-10-CM | POA: Diagnosis not present

## 2017-06-12 DIAGNOSIS — R112 Nausea with vomiting, unspecified: Secondary | ICD-10-CM | POA: Diagnosis not present

## 2017-06-12 DIAGNOSIS — R531 Weakness: Secondary | ICD-10-CM

## 2017-06-12 DIAGNOSIS — Z79899 Other long term (current) drug therapy: Secondary | ICD-10-CM | POA: Diagnosis not present

## 2017-06-12 DIAGNOSIS — R948 Abnormal results of function studies of other organs and systems: Secondary | ICD-10-CM | POA: Insufficient documentation

## 2017-06-12 DIAGNOSIS — R5383 Other fatigue: Secondary | ICD-10-CM | POA: Insufficient documentation

## 2017-06-12 DIAGNOSIS — K219 Gastro-esophageal reflux disease without esophagitis: Secondary | ICD-10-CM | POA: Diagnosis not present

## 2017-06-12 DIAGNOSIS — R591 Generalized enlarged lymph nodes: Secondary | ICD-10-CM | POA: Diagnosis present

## 2017-06-12 DIAGNOSIS — E119 Type 2 diabetes mellitus without complications: Secondary | ICD-10-CM | POA: Diagnosis not present

## 2017-06-12 NOTE — Progress Notes (Signed)
Patient here today for results.  

## 2017-06-20 DIAGNOSIS — R590 Localized enlarged lymph nodes: Secondary | ICD-10-CM

## 2017-07-09 ENCOUNTER — Ambulatory Visit: Admission: RE | Admit: 2017-07-09 | Payer: 59 | Source: Ambulatory Visit

## 2017-07-16 ENCOUNTER — Inpatient Hospital Stay: Payer: 59 | Admitting: Oncology

## 2017-07-25 DIAGNOSIS — R69 Illness, unspecified: Secondary | ICD-10-CM | POA: Diagnosis not present

## 2017-08-16 ENCOUNTER — Encounter: Payer: Self-pay | Admitting: Surgery

## 2017-08-23 ENCOUNTER — Ambulatory Visit: Admission: RE | Admit: 2017-08-23 | Payer: 59 | Source: Ambulatory Visit

## 2017-08-23 ENCOUNTER — Other Ambulatory Visit: Payer: Self-pay | Admitting: Oncology

## 2017-08-23 ENCOUNTER — Ambulatory Visit: Payer: Self-pay

## 2017-09-24 ENCOUNTER — Ambulatory Visit: Payer: 59 | Admitting: Family Medicine

## 2017-09-25 ENCOUNTER — Ambulatory Visit: Payer: 59 | Admitting: Family Medicine

## 2017-09-25 ENCOUNTER — Encounter: Payer: Self-pay | Admitting: Surgery

## 2017-09-25 ENCOUNTER — Encounter: Payer: Self-pay | Admitting: Family Medicine

## 2017-09-25 ENCOUNTER — Ambulatory Visit: Payer: 59 | Admitting: Surgery

## 2017-09-25 VITALS — BP 136/72 | HR 79 | Temp 98.6°F | Resp 16 | Wt 195.0 lb

## 2017-09-25 VITALS — BP 168/85 | HR 68 | Ht 69.0 in | Wt 196.8 lb

## 2017-09-25 DIAGNOSIS — E785 Hyperlipidemia, unspecified: Secondary | ICD-10-CM

## 2017-09-25 DIAGNOSIS — R202 Paresthesia of skin: Secondary | ICD-10-CM

## 2017-09-25 DIAGNOSIS — K219 Gastro-esophageal reflux disease without esophagitis: Secondary | ICD-10-CM

## 2017-09-25 DIAGNOSIS — I10 Essential (primary) hypertension: Secondary | ICD-10-CM | POA: Diagnosis not present

## 2017-09-25 DIAGNOSIS — R200 Anesthesia of skin: Secondary | ICD-10-CM | POA: Diagnosis not present

## 2017-09-25 DIAGNOSIS — E1169 Type 2 diabetes mellitus with other specified complication: Secondary | ICD-10-CM

## 2017-09-25 DIAGNOSIS — B373 Candidiasis of vulva and vagina: Secondary | ICD-10-CM | POA: Diagnosis not present

## 2017-09-25 DIAGNOSIS — B3731 Acute candidiasis of vulva and vagina: Secondary | ICD-10-CM

## 2017-09-25 MED ORDER — ATORVASTATIN CALCIUM 80 MG PO TABS: 80.0000 mg | ORAL_TABLET | Freq: Every day | ORAL | 3 refills | Status: DC

## 2017-09-25 MED ORDER — OMEPRAZOLE 40 MG PO CPDR: 40.0000 mg | DELAYED_RELEASE_CAPSULE | Freq: Every day | ORAL | 3 refills | Status: DC

## 2017-09-25 MED ORDER — TERCONAZOLE 0.4 % VA CREA: 1.0000 | TOPICAL_CREAM | Freq: Every day | VAGINAL | 0 refills | Status: AC

## 2017-09-25 NOTE — Patient Instructions (Signed)
Please call our office if you have any questions or concerns.   Dr.Finnegan contact information:  [phone]  If you move forward with having the PET scan please let us know so Dr.Davis can look at the results.

## 2017-09-25 NOTE — Progress Notes (Signed)
Patient: Lisa [name] Female    DOB: 07/13/1960   57 y.o.   MRN: [id number] Visit Date: 09/25/2017  Today's Provider: Lavon Paganini, MD   I, Lisa [name], CMA, am acting as scribe for Lavon Paganini, MD.  Subjective:    HPI    Pt is c/o vaginitis x 1 week. She has tried OTC cream for a yeast infection, without relief. She denies vaginal discharge, but is c/o vaginal itching. She is requesting Terconazole 0.4% cream for this, as this has improved these sx in the past.  She  has been taking Jardiance for 5 months and has had several yeast infections since that time.  She sees endocrinology for management of her diabetes.  Pt's LOV was 03/05/2017 for an acute issue. She was previosuly seeing Dr. Venia Minks, and has been waiting for a new MD. She needs a refill of Lipitor, Omeprazole, and needs her handicap placard renewed. This is being used for chronic knee pain s/p left TKR.  Allergies  Allergen Reactions  . Amoxicillin-Pot Clavulanate Swelling    lips  . Ciprofloxacin Swelling    lips  . Clarithromycin Swelling    lips     Current Outpatient Medications:  .  aspirin 81 MG tablet, Take 1 tablet by mouth daily., Disp: , Rfl:  .  Blood Glucose Monitoring Suppl (FIFTY50 GLUCOSE METER 2.0) W/DEVICE KIT, Use as directed., Disp: , Rfl:  .  clopidogrel (PLAVIX) 75 MG tablet, Take 75 mg by mouth daily., Disp: , Rfl:  .  glyBURIDE (DIABETA) 2.5 MG tablet, Take 1 tablet by mouth daily., Disp: , Rfl:  .  insulin glargine (LANTUS) 100 UNIT/ML injection, Inject 50 Units into the skin daily., Disp: , Rfl:  .  JARDIANCE 25 MG TABS tablet, Take 25 mg daily by mouth., Disp: , Rfl: 6 .  lisinopril (PRINIVIL,ZESTRIL) 20 MG tablet, Take 1 tablet (20 mg total) by mouth daily., Disp: 90 tablet, Rfl: 3 .  metoprolol succinate (TOPROL-XL) 25 MG 24 hr tablet, Take 1 tablet (25 mg total) by mouth daily. Take with or immediately following a meal., Disp: 30 tablet, Rfl: 6 .  MULTIPLE  VITAMINS-MINERALS PO, Take 1 tablet by mouth daily., Disp: , Rfl:  .  ursodiol (ACTIGALL) 300 MG capsule, Take 300 mg by mouth daily. , Disp: , Rfl:  .  atorvastatin (LIPITOR) 80 MG tablet, Take 1 tablet (80 mg total) by mouth daily at 6 PM., Disp: 30 tablet, Rfl: 12 .  omeprazole (PRILOSEC) 40 MG capsule, Take 40 mg by mouth daily., Disp: , Rfl:   Review of Systems  Constitutional: Negative for activity change, appetite change, chills, diaphoresis, fatigue, fever and unexpected weight change.  Genitourinary: Negative for frequency, pelvic pain, urgency, vaginal bleeding, vaginal discharge and vaginal pain.  Musculoskeletal: Positive for arthralgias.    Social History   Tobacco Use  . Smoking status: Never Smoker  . Smokeless tobacco: Never Used  Substance Use Topics  . Alcohol use: No   Objective:   BP 136/72 (BP Location: Left Arm, Patient Position: Sitting, Cuff Size: Large)   Pulse 79   Temp 98.6 F (37 C) (Oral)   Resp 16   Wt 195 lb (88.5 kg)   SpO2 99%   BMI 28.80 kg/m  Vitals:   09/25/17 0941  BP: 136/72  Pulse: 79  Resp: 16  Temp: 98.6 F (37 C)  TempSrc: Oral  SpO2: 99%  Weight: 195 lb (88.5 kg)  Physical Exam  Constitutional: She is oriented to person, place, and time. She appears well-developed and well-nourished. No distress.  HENT:  Head: Normocephalic and atraumatic.  Eyes: Conjunctivae are normal. No scleral icterus.  Neck: Neck supple. No thyromegaly present.  Cardiovascular: Normal rate, regular rhythm, normal heart sounds and intact distal pulses.  No murmur heard. Pulmonary/Chest: Effort normal and breath sounds normal. No respiratory distress. She has no wheezes. She has no rales.  Abdominal: Soft. She exhibits no distension. There is no tenderness.  Genitourinary:  Genitourinary Comments: GYN:  External genitalia within normal limits.  Vaginal mucosa pink, moist, normal rugae.  Nonfriable cervix without lesions, no bleeding, + thin white  discharge noted on speculum exam.   Musculoskeletal: She exhibits no edema or deformity.  Lymphadenopathy:    She has no cervical adenopathy.  Neurological: She is alert and oriented to person, place, and time.  Skin: Skin is warm and dry. Capillary refill takes less than 2 seconds. No rash noted.  Psychiatric: She has a normal mood and affect. Her behavior is normal.  Vitals reviewed.      Assessment & Plan:   Problem List Items Addressed This Visit      Cardiovascular and Mediastinum   BP (high blood pressure) - Primary    Well-controlled Continue metoprolol and lisinopril at current doses Check CMP      Relevant Medications   atorvastatin (LIPITOR) 80 MG tablet   Other Relevant Orders   Comprehensive metabolic panel     Digestive   Acid reflux    Well-controlled Continue omeprazole      Relevant Medications   omeprazole (PRILOSEC) 40 MG capsule     Endocrine   Hyperlipidemia due to type 2 diabetes mellitus (HCC)    Continue atorvastatin at current dose Recheck lipid panel Goal LDL less than 70 given diabetes Possible dose titration pending results      Relevant Medications   atorvastatin (LIPITOR) 80 MG tablet   Other Relevant Orders   Comprehensive metabolic panel   Lipid panel     Genitourinary   Candida vaginitis    Patient with history of recurrent vaginal candidiasis Jardiance could be exacerbating this issue Send swabs to check for BV and yeast Treat empirically with terconazole in the meantime      Relevant Medications   terconazole (TERAZOL 7) 0.4 % vaginal cream   Other Relevant Orders   NuSwab BV and Candida, NAA       Return in about 3 months (around 12/26/2017) for CPE.   The entirety of the information documented in the History of Present Illness, Review of Systems and Physical Exam were personally obtained by me. Portions of this information were initially documented by Raquel Sarna Ratchford, CMA and reviewed by me for thoroughness and  accuracy.    Virginia Crews, MD, MPH [facility] 09/26/2017 1:50 PM

## 2017-09-25 NOTE — Patient Instructions (Signed)

## 2017-09-26 NOTE — Assessment & Plan Note (Signed)
Continue atorvastatin at current dose Recheck lipid panel Goal LDL less than 70 given diabetes Possible dose titration pending results

## 2017-09-26 NOTE — Assessment & Plan Note (Signed)
Well-controlled Continue metoprolol and lisinopril at current doses Check CMP

## 2017-09-26 NOTE — Assessment & Plan Note (Signed)
Patient with history of recurrent vaginal candidiasis Jardiance could be exacerbating this issue Send swabs to check for BV and yeast Treat empirically with terconazole in the meantime

## 2017-09-26 NOTE — Assessment & Plan Note (Signed)
Well-controlled.  Continue omeprazole. 

## 2017-09-28 ENCOUNTER — Encounter: Payer: Self-pay | Admitting: Surgery

## 2017-09-28 ENCOUNTER — Telehealth: Payer: Self-pay

## 2017-09-28 LAB — NUSWAB BV AND CANDIDA, NAA
Atopobium vaginae: HIGH Score — AB
Candida albicans, NAA: POSITIVE — AB
Candida glabrata, NAA: NEGATIVE

## 2017-09-28 NOTE — Telephone Encounter (Signed)
Pt advised.

## 2017-09-28 NOTE — Telephone Encounter (Signed)
-----   Message from Virginia Crews, MD sent at 09/28/2017  8:49 AM EDT ----- Positive for yeast. No BV.  Virginia Crews, MD, MPH [facility] 09/28/2017 8:49 AM

## 2017-09-28 NOTE — Progress Notes (Signed)
Surgical Clinic Progress/Follow-up Note   HPI:  57 y.o. Female presents to clinic for evaluation of Right neck numbness and mild paresthesias. Patient underwent excisional biopsy of Right cervical lymphadenopathy 4 months ago with concern for lymphoma (no malignancy on pathology), but she reports her symptoms began just under 1 month ago. She otherwise denies any pain or neurologic symptoms otherwise and says her energy levels have improved with improved appetite and no further weight loss or fever/chills, but she does express anxiety every time she feels fatigued or warm/hot, stating that she worries that either of which worry her that she will start feeling as she did which prompted her workup. She also says she is uncertain whether declining the follow-up PET imaging advised by Dr. Grayland Ormond 3 months ago was the right choice for her.  Review of Systems:  Constitutional: denies any other weight loss, fever, chills, or sweats  Eyes: denies any other vision changes, history of eye injury  ENT: denies sore throat, hearing problems  Respiratory: denies shortness of breath, wheezing  Cardiovascular: denies chest pain, palpitations  Gastrointestinal: denies abdominal pain, N/V, or diarrhea Musculoskeletal: denies any other joint pains or cramps  Skin: Denies any other rashes or skin discolorations except as per interval history Neurological: denies any other headache, dizziness, weakness  Psychiatric: denies any other depression, anxiety  All other review of systems: otherwise negative   Vital Signs:  BP (!) 168/85   Pulse 68   Ht 5\' 9"  (1.753 m)   Wt 196 lb 12.8 oz (89.3 kg)   BMI 29.06 kg/m    Physical Exam:  Constitutional:  -- Normal body habitus  -- Awake, alert, and oriented x3  Eyes:  -- Pupils equally round and reactive to light  -- No scleral icterus  Ear, nose, throat:  -- No jugular venous distension -- Right neck linear post-surgical incisional wound very well healed and  completely NT without any surrounding erythema, drainage, or appreciable lymphadenopathy -- No nasal drainage, bleeding Pulmonary:  -- No crackles -- Equal breath sounds bilaterally -- Breathing non-labored at rest Cardiovascular:  -- S1, S2 present  -- No pericardial rubs  Gastrointestinal:  -- Soft, nontender, non-distended, no guarding/rebound  -- No abdominal masses appreciated, pulsatile or otherwise  Musculoskeletal / Integumentary:  -- Wounds or skin discoloration: None appreciated except as described above (ENT)  -- Extremities: B/L UE and LE FROM, hands and feet warm, no edema  Neurologic:  -- Motor function: intact and symmetric  -- Sensation: intact and symmetric   Laboratory studies:  CBC Latest Ref Rng & Units 05/23/2017 04/09/2017 04/05/2017  WBC 3.6 - 11.0 K/uL 6.6 7.7 5.4  Hemoglobin 12.0 - 16.0 g/dL 12.1 11.7(L) 11.2(L)  Hematocrit 35.0 - 47.0 % 38.3 36.8 35.1  Platelets 150 - 440 K/uL 188 189 171   CMP Latest Ref Rng & Units 05/31/2017 03/03/2017 02/26/2017  Glucose 65 - 99 mg/dL 142(H) 208(H) 196(H)  BUN 6 - 20 mg/dL 19 12 10   Creatinine 0.44 - 1.00 mg/dL 0.69 0.65 0.65  Sodium 135 - 145 mmol/L 134(L) 134(L) 136  Potassium 3.5 - 5.1 mmol/L 3.5 3.0(L) 3.3(L)  Chloride 101 - 111 mmol/L 99(L) 96(L) 100(L)  CO2 22 - 32 mmol/L 28 26 26   Calcium 8.9 - 10.3 mg/dL 9.2 9.0 9.0  Total Protein 6.5 - 8.1 g/dL 9.3(H) 9.1(H) 8.5(H)  Total Bilirubin 0.3 - 1.2 mg/dL 1.0 0.9 0.8  Alkaline Phos 38 - 126 U/L 593(H) 573(H) 430(H)  AST 15 - 41 U/L  99(H) 87(H) 104(H)  ALT 14 - 54 U/L 93(H) 114(H) 134(H)   Assessment:  57 y.o. yo Female with a problem list including...  Patient Active Problem List   Diagnosis Date Noted  . Cervical lymphadenopathy   . Lymphadenopathy 03/18/2017  . S/P drug eluting coronary stent placement 09/13/2016  . NASH (nonalcoholic steatohepatitis) 03/13/2016  . Fatty infiltration of liver 01/14/2016  . Primary biliary cholangitis ([place]) 01/14/2016  .  Elevated liver enzymes 09/23/2015  . Adaptation reaction 02/15/2015  . Allergic rhinitis 02/15/2015  . Diabetes mellitus, type 2 ([place]) 02/15/2015  . Bite from dog 02/15/2015  . Foot pain 02/15/2015  . Big thyroid 02/15/2015  . BP (high blood pressure) 02/15/2015  . Hypercholesteremia 02/15/2015  . Gonalgia 02/15/2015  . LBP (low back pain) 02/15/2015  . Hernia of anterior abdominal wall 02/15/2015  . Dermatophytosis of groin 02/15/2015  . Candida vaginitis 02/15/2015  . Acid reflux 11/03/2014  . Hyperlipidemia due to type 2 diabetes mellitus ([place]) 03/17/2014  . S/P TKR (total knee replacement) 07/28/2013  . Right hip pain 07/28/2013  . Abdominal lump 11/19/2012  . Hernia, lateral ventral 11/19/2012  . Intra-abdominal and pelvic swelling, mass and lump, unspecified site 11/19/2012  . Adnexal mass 11/06/2012    presents to clinic for evaluation of Right neck numbness and mild paresthesias possibly attributable to scar tissue remodeling vs diabetes mellitus vs underlying pathology not otherwise recognized 4 months s/p excisional biopsy of Right neck cervical lymphadenopathy for concern regarding lymphoma not identified on surgical pathology.  Plan:   - no indication for surgical intervention  - discussed with patient natural course for wound healing  - patient encouraged to follow-up with Dr. Grayland Ormond for follow-up PET as previously advised  - return to clinic as needed, instructed to call office if any questions or concerns  All of the above recommendations were discussed with the patient, and all of patient's questions were answered to her expressed satisfaction.  -- Marilynne Drivers Rosana Hoes, MD, [place]: [facility] - Partnering for exceptional care. Office: [phone]

## 2017-10-17 ENCOUNTER — Ambulatory Visit: Payer: Self-pay | Admitting: Surgery

## 2017-11-22 DIAGNOSIS — L819 Disorder of pigmentation, unspecified: Secondary | ICD-10-CM | POA: Diagnosis not present

## 2017-12-24 ENCOUNTER — Encounter: Payer: Self-pay | Admitting: Family Medicine

## 2017-12-24 ENCOUNTER — Other Ambulatory Visit: Payer: Self-pay

## 2017-12-24 ENCOUNTER — Ambulatory Visit (INDEPENDENT_AMBULATORY_CARE_PROVIDER_SITE_OTHER): Payer: 59 | Admitting: Family Medicine

## 2017-12-24 VITALS — BP 122/72 | HR 75 | Temp 98.5°F | Ht 69.0 in | Wt 203.4 lb

## 2017-12-24 DIAGNOSIS — Z1231 Encounter for screening mammogram for malignant neoplasm of breast: Secondary | ICD-10-CM

## 2017-12-24 DIAGNOSIS — I10 Essential (primary) hypertension: Secondary | ICD-10-CM

## 2017-12-24 DIAGNOSIS — E1169 Type 2 diabetes mellitus with other specified complication: Secondary | ICD-10-CM

## 2017-12-24 DIAGNOSIS — Z1159 Encounter for screening for other viral diseases: Secondary | ICD-10-CM

## 2017-12-24 DIAGNOSIS — E785 Hyperlipidemia, unspecified: Secondary | ICD-10-CM

## 2017-12-24 DIAGNOSIS — Z1239 Encounter for other screening for malignant neoplasm of breast: Secondary | ICD-10-CM

## 2017-12-24 DIAGNOSIS — I83813 Varicose veins of bilateral lower extremities with pain: Secondary | ICD-10-CM

## 2017-12-24 DIAGNOSIS — Z0000 Encounter for general adult medical examination without abnormal findings: Secondary | ICD-10-CM | POA: Diagnosis not present

## 2017-12-24 DIAGNOSIS — Z1211 Encounter for screening for malignant neoplasm of colon: Secondary | ICD-10-CM

## 2017-12-24 DIAGNOSIS — E119 Type 2 diabetes mellitus without complications: Secondary | ICD-10-CM

## 2017-12-24 MED ORDER — NA SULFATE-K SULFATE-MG SULF 17.5-3.13-1.6 GM/177ML PO SOLN: 1.0000 | Freq: Once | ORAL | 0 refills | Status: AC

## 2017-12-24 NOTE — Patient Instructions (Signed)
Preventive Care 40-64 Years, Female Preventive care refers to lifestyle choices and visits with your health care provider that can promote health and wellness. What does preventive care include?  A yearly physical exam. This is also called an annual well check.  Dental exams once or twice a year.  Routine eye exams. Ask your health care provider how often you should have your eyes checked.  Personal lifestyle choices, including: ? Daily care of your teeth and gums. ? Regular physical activity. ? Eating a healthy diet. ? Avoiding tobacco and drug use. ? Limiting alcohol use. ? Practicing safe sex. ? Taking low-dose aspirin daily starting at age 58. ? Taking vitamin and mineral supplements as recommended by your health care provider. What happens during an annual well check? The services and screenings done by your health care provider during your annual well check will depend on your age, overall health, lifestyle risk factors, and family history of disease. Counseling Your health care provider may ask you questions about your:  Alcohol use.  Tobacco use.  Drug use.  Emotional well-being.  Home and relationship well-being.  Sexual activity.  Eating habits.  Work and work Statistician.  Method of birth control.  Menstrual cycle.  Pregnancy history.  Screening You may have the following tests or measurements:  Height, weight, and BMI.  Blood pressure.  Lipid and cholesterol levels. These may be checked every 5 years, or more frequently if you are over 81 years old.  Skin check.  Lung cancer screening. You may have this screening every year starting at age 78 if you have a 30-pack-year history of smoking and currently smoke or have quit within the past 15 years.  Fecal occult blood test (FOBT) of the stool. You may have this test every year starting at age 65.  Flexible sigmoidoscopy or colonoscopy. You may have a sigmoidoscopy every 5 years or a colonoscopy  every 10 years starting at age 30.  Hepatitis C blood test.  Hepatitis B blood test.  Sexually transmitted disease (STD) testing.  Diabetes screening. This is done by checking your blood sugar (glucose) after you have not eaten for a while (fasting). You may have this done every 1-3 years.  Mammogram. This may be done every 1-2 years. Talk to your health care provider about when you should start having regular mammograms. This may depend on whether you have a family history of breast cancer.  BRCA-related cancer screening. This may be done if you have a family history of breast, ovarian, tubal, or peritoneal cancers.  Pelvic exam and Pap test. This may be done every 3 years starting at age 80. Starting at age 36, this may be done every 5 years if you have a Pap test in combination with an HPV test.  Bone density scan. This is done to screen for osteoporosis. You may have this scan if you are at high risk for osteoporosis.  Discuss your test results, treatment options, and if necessary, the need for more tests with your health care provider. Vaccines Your health care provider may recommend certain vaccines, such as:  Influenza vaccine. This is recommended every year.  Tetanus, diphtheria, and acellular pertussis (Tdap, Td) vaccine. You may need a Td booster every 10 years.  Varicella vaccine. You may need this if you have not been vaccinated.  Zoster vaccine. You may need this after age 5.  Measles, mumps, and rubella (MMR) vaccine. You may need at least one dose of MMR if you were born in  1956 or later. You may also need a second dose.  Pneumococcal 13-valent conjugate (PCV13) vaccine. You may need this if you have certain conditions and were not previously vaccinated.  Pneumococcal polysaccharide (PPSV23) vaccine. You may need one or two doses if you smoke cigarettes or if you have certain conditions.  Meningococcal vaccine. You may need this if you have certain  conditions.  Hepatitis A vaccine. You may need this if you have certain conditions or if you travel or work in places where you may be exposed to hepatitis A.  Hepatitis B vaccine. You may need this if you have certain conditions or if you travel or work in places where you may be exposed to hepatitis B.  Haemophilus influenzae type b (Hib) vaccine. You may need this if you have certain conditions.  Talk to your health care provider about which screenings and vaccines you need and how often you need them. This information is not intended to replace advice given to you by your health care provider. Make sure you discuss any questions you have with your health care provider. Document Released: 05/21/2015 Document Revised: 01/12/2016 Document Reviewed: 02/23/2015 Elsevier Interactive Patient Education  2018 [employer]  

## 2017-12-24 NOTE — Progress Notes (Signed)
Patient: Lisa [name], Female    DOB: 07/28/1960, 57 y.o.   MRN: [id number] Visit Date: 12/24/2017  Today's Provider: Lavon Paganini, MD   Chief Complaint  Patient presents with  . Annual Exam   Subjective:  I, Lisa [name], CMA, am acting as a scribe for Lavon Paganini, MD.    Annual physical exam Lisa [name] is a 56 y.o. female who presents today for health maintenance and complete physical. She feels well. She reports nor regular exercising, but walking at work. She reports she is sleeping fair. Patient states it is difficult for her to fall asleep.   Never had colon cancer screening Last mammogram 02/2016 Pap smear 09/2015 Followed by Endocrinology and Hepatology Would like a referral to vascular for varicose veins that are intermittently painful States she is having [facility] wellness exam tomorrow -----------------------------------------------------------------   Review of Systems  Constitutional: Positive for unexpected weight change.  HENT: Negative.   Eyes: Negative.   Respiratory: Negative.   Cardiovascular: Negative.   Gastrointestinal: Positive for abdominal distention.  Endocrine: Negative.   Genitourinary: Negative.   Musculoskeletal: Negative.   Skin: Negative.   Allergic/Immunologic: Negative.   Neurological: Negative.   Hematological: Negative.   Psychiatric/Behavioral: Negative.     Social History      She  reports that she has never smoked. She has never used smokeless tobacco. She reports that she does not drink alcohol or use drugs.       Social History   Socioeconomic History  . Marital status: Married    Spouse name: Not on file  . Number of children: 1  . Years of education: Not on file  . Highest education level: Not on file  Occupational History  . Not on file  Social Needs  . Financial resource strain: Not on file  . Food insecurity:    Worry: Not on file    Inability: Not on file  . Transportation  needs:    Medical: Not on file    Non-medical: Not on file  Tobacco Use  . Smoking status: Never Smoker  . Smokeless tobacco: Never Used  Substance and Sexual Activity  . Alcohol use: No  . Drug use: No  . Sexual activity: Not on file  Lifestyle  . Physical activity:    Days per week: Not on file    Minutes per session: Not on file  . Stress: Not on file  Relationships  . Social connections:    Talks on phone: Not on file    Gets together: Not on file    Attends religious service: Not on file    Active member of club or organization: Not on file    Attends meetings of clubs or organizations: Not on file    Relationship status: Not on file  Other Topics Concern  . Not on file  Social History Narrative  . Not on file    Past Medical History:  Diagnosis Date  . Allergic rhinitis 02/15/2015  . Arthritis   . BP (high blood pressure) 02/15/2015  . Cervical lymphadenopathy   . Cirrhosis ([place])   . Diabetes mellitus   . Elevated liver enzymes 09/23/2015  . Elevated liver enzymes 09/23/2015  . GERD (gastroesophageal reflux disease)   . Gonalgia 02/15/2015  . Hernia, lateral ventral 11/19/2012  . Hypertension   . Intra-abdominal and pelvic swelling, mass and lump, unspecified site 11/19/2012  . Lymphadenopathy 03/18/2017     Patient Active Problem List  Diagnosis Date Noted  . S/P drug eluting coronary stent placement 09/13/2016  . NASH (nonalcoholic steatohepatitis) 03/13/2016  . Fatty infiltration of liver 01/14/2016  . Primary biliary cholangitis ([place]) 01/14/2016  . Abdominal pain, recurrent 01/14/2016  . Elevated liver enzymes 09/23/2015  . Adaptation reaction 02/15/2015  . Allergic rhinitis 02/15/2015  . Diabetes mellitus, type 2 ([place]) 02/15/2015  . Bite from dog 02/15/2015  . Foot pain 02/15/2015  . Big thyroid 02/15/2015  . BP (high blood pressure) 02/15/2015  . Hypercholesteremia 02/15/2015  . Gonalgia 02/15/2015  . LBP (low back pain) 02/15/2015  . Hernia  of anterior abdominal wall 02/15/2015  . Dermatophytosis of groin 02/15/2015  . Candida vaginitis 02/15/2015  . Acid reflux 11/03/2014  . Hyperlipidemia due to type 2 diabetes mellitus ([place]) 03/17/2014  . S/P TKR (total knee replacement) 07/28/2013  . Right hip pain 07/28/2013  . Abdominal lump 11/19/2012  . Hernia, lateral ventral 11/19/2012  . Intra-abdominal and pelvic swelling, mass and lump, unspecified site 11/19/2012  . Adnexal mass 11/06/2012    Past Surgical History:  Procedure Laterality Date  . ABDOMINAL HYSTERECTOMY     2009  . CHOLECYSTECTOMY  1997  . CORONARY ANGIOPLASTY     stints  . CORONARY STENT INTERVENTION N/A 09/13/2016   Procedure: Coronary Stent Intervention;  Surgeon: Yolonda Kida, MD;  Location: [facility];  Service: Cardiovascular;  Laterality: N/A;  . GALLBLADDER SURGERY    . HERNIA REPAIR  07/29/5571   periumbilical with right ooph  . JOINT REPLACEMENT     left knee  . KNEE SURGERY  12/2011   total knee replacement Dr. Duayne Cal  . LEFT HEART CATH AND CORONARY ANGIOGRAPHY N/A 09/13/2016   Procedure: Left Heart Cath and Coronary Angiography;  Surgeon: Yolonda Kida, MD;  Location: [facility];  Service: Cardiovascular;  Laterality: N/A;  . LYMPH GLAND EXCISION N/A 06/04/2017   Procedure: CERVICAL LYMPH NODE BIOPSY;  Surgeon: Vickie Epley, MD;  Location: [facility];  Service: General;  Laterality: N/A;  . TUBAL LIGATION      Family History        Family Status  Relation Name Status  . Sister  Alive  . Brother  Alive  . Sister  Alive  . Sister  Alive  . Sister  Alive  . Brother  Alive  . Mother  Deceased  . Father  Deceased        Her family history includes Diabetes in her mother, sister, sister, and sister; Heart disease in her father.      Allergies  Allergen Reactions  . Amoxicillin-Pot Clavulanate Swelling    lips  . Ciprofloxacin Swelling    lips  . Clarithromycin Swelling    lips     Current Outpatient  Medications:  .  aspirin 81 MG tablet, Take 1 tablet by mouth daily., Disp: , Rfl:  .  atorvastatin (LIPITOR) 80 MG tablet, Take 1 tablet (80 mg total) by mouth daily at 6 PM., Disp: 90 tablet, Rfl: 3 .  Blood Glucose Monitoring Suppl (FIFTY50 GLUCOSE METER 2.0) W/DEVICE KIT, Use as directed., Disp: , Rfl:  .  clopidogrel (PLAVIX) 75 MG tablet, Take 75 mg by mouth daily., Disp: , Rfl:  .  glyBURIDE (DIABETA) 2.5 MG tablet, Take 1 tablet by mouth daily., Disp: , Rfl:  .  insulin glargine (LANTUS) 100 UNIT/ML injection, Inject 50 Units into the skin daily., Disp: , Rfl:  .  JARDIANCE 25 MG TABS tablet,  Take 25 mg daily by mouth., Disp: , Rfl: 6 .  lisinopril (PRINIVIL,ZESTRIL) 20 MG tablet, Take 1 tablet (20 mg total) by mouth daily., Disp: 90 tablet, Rfl: 3 .  metoprolol succinate (TOPROL-XL) 25 MG 24 hr tablet, Take 1 tablet (25 mg total) by mouth daily. Take with or immediately following a meal., Disp: 30 tablet, Rfl: 6 .  MULTIPLE VITAMINS-MINERALS PO, Take 1 tablet by mouth daily., Disp: , Rfl:  .  omeprazole (PRILOSEC) 40 MG capsule, Take 1 capsule (40 mg total) by mouth daily., Disp: 90 capsule, Rfl: 3 .  ursodiol (ACTIGALL) 300 MG capsule, Take 300 mg by mouth daily. , Disp: , Rfl:    Patient Care Team: Virginia Crews, MD as PCP - General (Family Medicine)      Objective:   Vitals: BP 122/72 (BP Location: Right Arm, Patient Position: Sitting, Cuff Size: Normal)   Pulse 75   Temp 98.5 F (36.9 C) (Oral)   Ht _0  (1.753 m)   Wt 203 lb 6.4 oz (92.3 kg)   SpO2 96%   BMI 30.04 kg/m    Vitals:   12/24/17 0911  BP: 122/72  Pulse: 75  Temp: 98.5 F (36.9 C)  TempSrc: Oral  SpO2: 96%  Weight: 203 lb 6.4 oz (92.3 kg)  Height: _1  (1.753 m)     Physical Exam  Constitutional: She is oriented to person, place, and time. She appears well-developed and well-nourished. No distress.  HENT:  Head: Normocephalic and atraumatic.  Right Ear: External ear normal.  Left Ear:  External ear normal.  Nose: Nose normal.  Mouth/Throat: Oropharynx is clear and moist.  Eyes: Pupils are equal, round, and reactive to light. Conjunctivae and EOM are normal. No scleral icterus.  Neck: Neck supple. No thyromegaly present.  Cardiovascular: Normal rate, regular rhythm, normal heart sounds and intact distal pulses.  No murmur heard. Pulmonary/Chest: Effort normal and breath sounds normal. No respiratory distress. She has no wheezes. She has no rales.  Abdominal: Soft. Bowel sounds are normal. She exhibits no distension. There is no tenderness. There is no rebound and no guarding.  Musculoskeletal: She exhibits no edema or deformity.  Lymphadenopathy:    She has no cervical adenopathy.  Neurological: She is alert and oriented to person, place, and time.  Skin: Skin is warm and dry. Capillary refill takes less than 2 seconds. No rash noted.  Psychiatric: She has a normal mood and affect. Her behavior is normal.  Vitals reviewed.    Depression Screen PHQ 2/9 Scores 12/24/2017 09/20/2015  PHQ - 2 Score 0 0  PHQ- 9 Score 0 -     Assessment & Plan:     Routine Health Maintenance and Physical Exam  Exercise Activities and Dietary recommendations Goals   None     Immunization History  Administered Date(s) Administered  . Pneumococcal Polysaccharide-23 09/20/2015  . Tdap 12/08/2014    Health Maintenance  Topic Date Due  . HEMOGLOBIN A1C  [birth year]  . Hepatitis C Screening  Mar 18, 1961  . FOOT EXAM  10/04/1971  . HIV Screening  10/03/1976  . COLONOSCOPY  10/04/2011  . INFLUENZA VACCINE  12/06/2017  . OPHTHALMOLOGY EXAM  12/28/2017  . MAMMOGRAM  02/23/2018  . PAP SMEAR  09/20/2018  . TETANUS/TDAP  12/07/2024  . PNEUMOCOCCAL POLYSACCHARIDE VACCINE AGE 31-64 HIGH RISK  Completed     Discussed health benefits of physical activity, and encouraged her to engage in regular exercise appropriate for her age and condition.      --------------------------------------------------------------------  Problem List Items Addressed This Visit      Cardiovascular and Mediastinum   BP (high blood pressure)   Relevant Orders   Basic Metabolic Panel (BMET)     Endocrine   Diabetes mellitus, type 2 ([place])   Relevant Orders   HgB A1c   Hyperlipidemia due to type 2 diabetes mellitus ([place])   Relevant Orders   Lipid Profile    Other Visit Diagnoses    Annual physical exam    -  Primary   Breast cancer screening       Relevant Orders   MM 3D SCREEN BREAST BILATERAL   Colon cancer screening       Relevant Orders   Ambulatory referral to Gastroenterology   Varicose veins of both lower extremities with pain       Relevant Orders   Ambulatory referral to Vascular Surgery   Need for hepatitis C screening test       Relevant Orders   Hepatitis C Antibody       Return in about 6 months (around 06/26/2018) for chronic disease f/u.   The entirety of the information documented in the History of Present Illness, Review of Systems and Physical Exam were personally obtained by me. Portions of this information were initially documented by Lisa [name], CMA and reviewed by me for thoroughness and accuracy.    Virginia Crews, MD, MPH [facility] 12/24/2017 10:10 AM

## 2017-12-26 DIAGNOSIS — E1169 Type 2 diabetes mellitus with other specified complication: Secondary | ICD-10-CM | POA: Diagnosis not present

## 2017-12-26 DIAGNOSIS — Z1159 Encounter for screening for other viral diseases: Secondary | ICD-10-CM | POA: Diagnosis not present

## 2017-12-26 DIAGNOSIS — E785 Hyperlipidemia, unspecified: Secondary | ICD-10-CM | POA: Diagnosis not present

## 2017-12-26 DIAGNOSIS — E119 Type 2 diabetes mellitus without complications: Secondary | ICD-10-CM | POA: Diagnosis not present

## 2017-12-27 LAB — BASIC METABOLIC PANEL
BUN/Creatinine Ratio: 22 (ref 9–23)
BUN: 17 mg/dL (ref 6–24)
CO2: 24 mmol/L (ref 20–29)
Calcium: 9.7 mg/dL (ref 8.7–10.2)
Chloride: 96 mmol/L (ref 96–106)
Creatinine, Ser: 0.77 mg/dL (ref 0.57–1.00)
GFR calc Af Amer: 100 mL/min/{1.73_m2} (ref >59)
GFR calc non Af Amer: 87 mL/min/{1.73_m2} (ref >59)
Glucose: 293 mg/dL — ABNORMAL HIGH (ref 65–99)
Potassium: 3.7 mmol/L (ref 3.5–5.2)
Sodium: 135 mmol/L (ref 134–144)

## 2017-12-27 LAB — LIPID PANEL
Chol/HDL Ratio: 2.8 {ratio} (ref 0.0–4.4)
Cholesterol, Total: 359 mg/dL — ABNORMAL HIGH (ref 100–199)
HDL: 127 mg/dL (ref >39)
LDL Calculated: 211 mg/dL — ABNORMAL HIGH (ref 0–99)
Triglycerides: 106 mg/dL (ref 0–149)
VLDL Cholesterol Cal: 21 mg/dL (ref 5–40)

## 2017-12-27 LAB — HEMOGLOBIN A1C
Est. average glucose Bld gHb Est-mCnc: 301 mg/dL
Hgb A1c MFr Bld: 12.1 % — ABNORMAL HIGH (ref 4.8–5.6)

## 2017-12-27 LAB — HEPATITIS C ANTIBODY: Hep C Virus Ab: <0.1 s/co ratio (ref 0.0–0.9)

## 2017-12-28 ENCOUNTER — Telehealth: Payer: Self-pay

## 2017-12-28 NOTE — Telephone Encounter (Signed)
lmtcb

## 2017-12-28 NOTE — Telephone Encounter (Signed)
-----   Message from Virginia Crews, MD sent at 12/27/2017  8:30 AM EDT ----- Cholesterol is high.   Is she taking her cholesterol medication (atorvastatin) regularly?  I'm concerned that she isn't.  This is very important given her coronary artery disease and fatty liver disease.    Hemolgobin A1c is significantly elevated at 12.1.  Is she still seeing Endocrinology?  Normal kidney function and electrolytes.  Virginia Crews, MD, MPH [facility] 12/27/2017 8:30 AM

## 2017-12-31 NOTE — Telephone Encounter (Signed)
LMTCB  Thanks,  -Joseline 

## 2018-01-03 NOTE — Telephone Encounter (Signed)
Pt returned call. 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 Pt request that her call be return tomorrow after 930 am. Please advise. Thanks TNP

## 2018-01-03 NOTE — Telephone Encounter (Signed)
LMTCB and letter mailed to address in chart.

## 2018-01-04 DIAGNOSIS — E113293 Type 2 diabetes mellitus with mild nonproliferative diabetic retinopathy without macular edema, bilateral: Secondary | ICD-10-CM | POA: Diagnosis not present

## 2018-01-04 LAB — HM DIABETES EYE EXAM

## 2018-01-08 NOTE — Telephone Encounter (Signed)
Patient advised. She reviewed results in My Chart. She states she was out of cholesterol medication, but has restarted medication. She also reports her Endocrinologist took her off of Trulicity, she has a follow up scheduled with him in 2 weeks.

## 2018-01-14 ENCOUNTER — Encounter (INDEPENDENT_AMBULATORY_CARE_PROVIDER_SITE_OTHER): Payer: Self-pay | Admitting: Vascular Surgery

## 2018-01-14 ENCOUNTER — Ambulatory Visit (INDEPENDENT_AMBULATORY_CARE_PROVIDER_SITE_OTHER): Payer: 59 | Admitting: Vascular Surgery

## 2018-01-14 VITALS — BP 160/91 | HR 76 | Resp 13 | Ht 69.0 in | Wt 209.0 lb

## 2018-01-14 DIAGNOSIS — I83813 Varicose veins of bilateral lower extremities with pain: Secondary | ICD-10-CM | POA: Insufficient documentation

## 2018-01-14 DIAGNOSIS — R600 Localized edema: Secondary | ICD-10-CM | POA: Diagnosis not present

## 2018-01-14 NOTE — Progress Notes (Signed)
Subjective:    Patient ID: Lisa [name], female    DOB: 1960/10/29, 57 y.o.   MRN: [id number] Chief Complaint  Patient presents with  . New Patient (Initial Visit)    Varicose Veins   Presents as a new patient referred by Dr. Jackelyn Poling for evaluation of varicose veins.  Patient notes a long-standing history of varicose veins located to the bilateral legs.  The patient notes that they have progressively become more painful and increased in size and number over the last few years.  The patient does stand a lot for work.  The patient notes that the pain worsens with sitting and standing for long periods of time.  The patient feels that her symptoms have progressed to the point that she is unable to function on a daily basis and that they have become lifestyle limiting.  The patient states that she does engage in conservative therapy including wearing medical grade 1 compression socks, elevating her legs and remaining active on a daily basis.  The patient notes that conservative therapy has not improved her symptoms.  The patient also experiences some bilateral lower extremity edema.  This edema is also associated with some discomfort.  The patient denies any recent surgery or trauma to the bilateral legs.  The patient denies any DVT history.  The patient denies any claudication-like symptoms, rest pain or ulcer formation to the bilateral lower legs.  Patient denies any fever, nausea vomiting.  Review of Systems  Constitutional: Negative.   HENT: Negative.   Eyes: Negative.   Respiratory: Negative.   Cardiovascular: Positive for leg swelling.       Painful varicose veins  Gastrointestinal: Negative.   Endocrine: Negative.   Genitourinary: Negative.   Musculoskeletal: Negative.   Skin: Negative.   Allergic/Immunologic: Negative.   Neurological: Negative.   Hematological: Negative.   Psychiatric/Behavioral: Negative.       Objective:   Physical Exam  Constitutional: She is  oriented to person, place, and time. She appears well-developed and well-nourished. No distress.  HENT:  Head: Normocephalic and atraumatic.  Right Ear: External ear normal.  Left Ear: External ear normal.  Eyes: Pupils are equal, round, and reactive to light. Conjunctivae and EOM are normal.  Neck: Normal range of motion.  Cardiovascular: Normal rate, regular rhythm and normal heart sounds.  Pulmonary/Chest: Effort normal and breath sounds normal.  Musculoskeletal: Normal range of motion. She exhibits edema (Mild bilateral nonpitting edema noted).  Neurological: She is alert and oriented to person, place, and time.  Skin: Skin is warm and dry. She is not diaphoretic.  Diffuse greater than 1 cm varicosities noted to the bilateral legs.  There is no stasis dermatitis, fibrosis, cellulitis or active ulcerations noted at this time.  Psychiatric: She has a normal mood and affect. Her behavior is normal. Judgment and thought content normal.  Vitals reviewed.  BP (!) 160/91 (BP Location: Right Arm, Patient Position: Sitting)   Pulse 76   Resp 13   Ht 5' 9" (1.753 m)   Wt 209 lb (94.8 kg)   BMI 30.86 kg/m   Past Medical History:  Diagnosis Date  . Allergic rhinitis 02/15/2015  . Arthritis   . BP (high blood pressure) 02/15/2015  . Cervical lymphadenopathy   . Cirrhosis ([place])   . Diabetes mellitus   . Elevated liver enzymes 09/23/2015  . Elevated liver enzymes 09/23/2015  . GERD (gastroesophageal reflux disease)   . Gonalgia 02/15/2015  . Hernia, lateral ventral 11/19/2012  . Hypertension   .  Intra-abdominal and pelvic swelling, mass and lump, unspecified site 11/19/2012  . Lymphadenopathy 03/18/2017   Social History   Socioeconomic History  . Marital status: Married    Spouse name: Not on file  . Number of children: 1  . Years of education: Not on file  . Highest education level: Not on file  Occupational History  . Not on file  Social Needs  . Financial resource strain: Not  on file  . Food insecurity:    Worry: Not on file    Inability: Not on file  . Transportation needs:    Medical: Not on file    Non-medical: Not on file  Tobacco Use  . Smoking status: Never Smoker  . Smokeless tobacco: Never Used  Substance and Sexual Activity  . Alcohol use: No  . Drug use: No  . Sexual activity: Not on file  Lifestyle  . Physical activity:    Days per week: Not on file    Minutes per session: Not on file  . Stress: Not on file  Relationships  . Social connections:    Talks on phone: Not on file    Gets together: Not on file    Attends religious service: Not on file    Active member of club or organization: Not on file    Attends meetings of clubs or organizations: Not on file    Relationship status: Not on file  . Intimate partner violence:    Fear of current or ex partner: Not on file    Emotionally abused: Not on file    Physically abused: Not on file    Forced sexual activity: Not on file  Other Topics Concern  . Not on file  Social History Narrative  . Not on file   Past Surgical History:  Procedure Laterality Date  . ABDOMINAL HYSTERECTOMY     2009  . CHOLECYSTECTOMY  1997  . CORONARY ANGIOPLASTY     stints  . CORONARY STENT INTERVENTION N/A 09/13/2016   Procedure: Coronary Stent Intervention;  Surgeon: Yolonda Kida, MD;  Location: [facility];  Service: Cardiovascular;  Laterality: N/A;  . GALLBLADDER SURGERY    . HERNIA REPAIR  01/22/9149   periumbilical with right ooph  . JOINT REPLACEMENT     left knee  . KNEE SURGERY  12/2011   total knee replacement Dr. Duayne Cal  . LEFT HEART CATH AND CORONARY ANGIOGRAPHY N/A 09/13/2016   Procedure: Left Heart Cath and Coronary Angiography;  Surgeon: Yolonda Kida, MD;  Location: [facility];  Service: Cardiovascular;  Laterality: N/A;  . LYMPH GLAND EXCISION N/A 06/04/2017   Procedure: CERVICAL LYMPH NODE BIOPSY;  Surgeon: Vickie Epley, MD;  Location: [facility];  Service:  General;  Laterality: N/A;  . TUBAL LIGATION     Family History  Problem Relation Age of Onset  . Diabetes Sister   . Diabetes Sister   . Diabetes Sister   . Diabetes Mother   . Heart disease Father    Allergies  Allergen Reactions  . Amoxicillin-Pot Clavulanate Swelling    lips  . Ciprofloxacin Swelling    lips  . Clarithromycin Swelling    lips      Assessment & Plan:  Presents as a new patient referred by Dr. Jackelyn Poling for evaluation of varicose veins.  Patient notes a long-standing history of varicose veins located to the bilateral legs.  The patient notes that they have progressively become more painful and increased in  size and number over the last few years.  The patient does stand a lot for work.  The patient notes that the pain worsens with sitting and standing for long periods of time.  The patient feels that her symptoms have progressed to the point that she is unable to function on a daily basis and that they have become lifestyle limiting.  The patient states that she does engage in conservative therapy including wearing medical grade 1 compression socks, elevating her legs and remaining active on a daily basis.  The patient notes that conservative therapy has not improved her symptoms.  The patient also experiences some bilateral lower extremity edema.  This edema is also associated with some discomfort.  The patient denies any recent surgery or trauma to the bilateral legs.  The patient denies any DVT history.  The patient denies any claudication-like symptoms, rest pain or ulcer formation to the bilateral lower legs.  Patient denies any fever, nausea vomiting.  1. Varicose veins of both lower extremities with pain - New The patient was encouraged to wear graduated compression stockings (20-30 mmHg) on a daily basis. The patient was instructed to begin wearing the stockings first thing in the morning and removing them in the evening. The patient was instructed specifically  not to sleep in the stockings. Prescription given.  In addition, behavioral modification including elevation during the day will be initiated. Anti-inflammatories for pain. I will bring the patient back in approximately 3 months to undergo a bilateral lower extremity venous duplex to rule out any contributing venous versus lymphatic disease. The patient will follow up in three months to asses conservative management.  Information on compression stockings was given to the patient. The patient was instructed to call the office in the interim if any worsening edema or ulcerations to the legs, feet or toes occurs. The patient expresses their understanding.  - VAS [place] LOWER EXTREMITY VENOUS REFLUX; Future  2. Bilateral lower extremity edema - New As above  - VAS [place] LOWER EXTREMITY VENOUS REFLUX; Future  Current Outpatient Medications on File Prior to Visit  Medication Sig Dispense Refill  . aspirin 81 MG tablet Take 1 tablet by mouth daily.    Marland Kitchen atorvastatin (LIPITOR) 80 MG tablet Take 1 tablet (80 mg total) by mouth daily at 6 PM. 90 tablet 3  . clopidogrel (PLAVIX) 75 MG tablet Take 75 mg by mouth daily.    Marland Kitchen glyBURIDE (DIABETA) 2.5 MG tablet Take 1 tablet by mouth daily.    . insulin glargine (LANTUS) 100 UNIT/ML injection Inject 50 Units into the skin daily.    Marland Kitchen JARDIANCE 25 MG TABS tablet Take 25 mg daily by mouth.  6  . lisinopril (PRINIVIL,ZESTRIL) 20 MG tablet Take 1 tablet (20 mg total) by mouth daily. 90 tablet 3  . MULTIPLE VITAMINS-MINERALS PO Take 1 tablet by mouth daily.    Marland Kitchen omeprazole (PRILOSEC) 40 MG capsule Take 1 capsule (40 mg total) by mouth daily. 90 capsule 3  . ursodiol (ACTIGALL) 300 MG capsule Take 300 mg by mouth daily.     . Blood Glucose Monitoring Suppl (FIFTY50 GLUCOSE METER 2.0) W/DEVICE KIT Use as directed.     No current facility-administered medications on file prior to visit.    There are no Patient Instructions on file for this visit. No follow-ups on  file.  KIMBERLY A STEGMAYER, PA-C

## 2018-01-21 ENCOUNTER — Ambulatory Visit: Payer: 59 | Admitting: Certified Registered Nurse Anesthetist

## 2018-01-21 ENCOUNTER — Encounter
Admission: RE | Disposition: A | Discharge status: Home / Self Care | Payer: Self-pay | Source: Ambulatory Visit | Attending: Gastroenterology

## 2018-01-21 ENCOUNTER — Encounter: Payer: Self-pay | Admitting: Anesthesiology

## 2018-01-21 ENCOUNTER — Ambulatory Visit
Admission: RE | Admit: 2018-01-21 | Discharge: 2018-01-21 | Disposition: A | Discharge status: Home / Self Care | Payer: 59 | Source: Ambulatory Visit | Attending: Gastroenterology | Admitting: Gastroenterology

## 2018-01-21 DIAGNOSIS — M1990 Unspecified osteoarthritis, unspecified site: Secondary | ICD-10-CM | POA: Insufficient documentation

## 2018-01-21 DIAGNOSIS — Z1211 Encounter for screening for malignant neoplasm of colon: Secondary | ICD-10-CM | POA: Diagnosis present

## 2018-01-21 DIAGNOSIS — Z880 Allergy status to penicillin: Secondary | ICD-10-CM | POA: Diagnosis not present

## 2018-01-21 DIAGNOSIS — Z96652 Presence of left artificial knee joint: Secondary | ICD-10-CM | POA: Insufficient documentation

## 2018-01-21 DIAGNOSIS — E1151 Type 2 diabetes mellitus with diabetic peripheral angiopathy without gangrene: Secondary | ICD-10-CM | POA: Insufficient documentation

## 2018-01-21 DIAGNOSIS — K219 Gastro-esophageal reflux disease without esophagitis: Secondary | ICD-10-CM | POA: Insufficient documentation

## 2018-01-21 DIAGNOSIS — K759 Inflammatory liver disease, unspecified: Secondary | ICD-10-CM | POA: Insufficient documentation

## 2018-01-21 DIAGNOSIS — D125 Benign neoplasm of sigmoid colon: Secondary | ICD-10-CM

## 2018-01-21 DIAGNOSIS — K635 Polyp of colon: Secondary | ICD-10-CM | POA: Insufficient documentation

## 2018-01-21 DIAGNOSIS — D120 Benign neoplasm of cecum: Secondary | ICD-10-CM | POA: Insufficient documentation

## 2018-01-21 DIAGNOSIS — Z881 Allergy status to other antibiotic agents status: Secondary | ICD-10-CM | POA: Insufficient documentation

## 2018-01-21 DIAGNOSIS — Z7982 Long term (current) use of aspirin: Secondary | ICD-10-CM | POA: Insufficient documentation

## 2018-01-21 DIAGNOSIS — K7460 Unspecified cirrhosis of liver: Secondary | ICD-10-CM | POA: Diagnosis not present

## 2018-01-21 DIAGNOSIS — F419 Anxiety disorder, unspecified: Secondary | ICD-10-CM | POA: Diagnosis not present

## 2018-01-21 DIAGNOSIS — Z955 Presence of coronary angioplasty implant and graft: Secondary | ICD-10-CM | POA: Diagnosis not present

## 2018-01-21 DIAGNOSIS — Z794 Long term (current) use of insulin: Secondary | ICD-10-CM | POA: Insufficient documentation

## 2018-01-21 DIAGNOSIS — I10 Essential (primary) hypertension: Secondary | ICD-10-CM | POA: Diagnosis not present

## 2018-01-21 DIAGNOSIS — Z8249 Family history of ischemic heart disease and other diseases of the circulatory system: Secondary | ICD-10-CM | POA: Insufficient documentation

## 2018-01-21 HISTORY — PX: COLONOSCOPY WITH PROPOFOL: SHX5780

## 2018-01-21 LAB — GLUCOSE, CAPILLARY: Glucose-Capillary: 171 mg/dL — ABNORMAL HIGH (ref 70–99)

## 2018-01-21 SURGERY — COLONOSCOPY WITH PROPOFOL
Anesthesia: General

## 2018-01-21 MED ORDER — PROPOFOL 10 MG/ML IV BOLUS
INTRAVENOUS | Status: DC | PRN
  Administered 2018-01-21: 50 mg via INTRAVENOUS

## 2018-01-21 MED ORDER — LIDOCAINE HCL (CARDIAC) PF 100 MG/5ML IV SOSY
PREFILLED_SYRINGE | INTRAVENOUS | Status: DC | PRN
  Administered 2018-01-21: 100 mg via INTRAVENOUS

## 2018-01-21 MED ORDER — FENTANYL CITRATE (PF) 100 MCG/2ML IJ SOLN
INTRAMUSCULAR | Status: DC | PRN
  Administered 2018-01-21: 25 ug via INTRAVENOUS

## 2018-01-21 MED ORDER — GLYCOPYRROLATE 0.2 MG/ML IJ SOLN
INTRAMUSCULAR | Status: AC
  Filled 2018-01-21: qty 1

## 2018-01-21 MED ORDER — LIDOCAINE HCL (PF) 2 % IJ SOLN
INTRAMUSCULAR | Status: AC
  Filled 2018-01-21: qty 10

## 2018-01-21 MED ORDER — PHENYLEPHRINE HCL 10 MG/ML IJ SOLN
INTRAMUSCULAR | Status: AC
  Filled 2018-01-21: qty 1

## 2018-01-21 MED ORDER — PROPOFOL 500 MG/50ML IV EMUL
INTRAVENOUS | Status: DC | PRN
  Administered 2018-01-21: 100 ug/kg/min via INTRAVENOUS

## 2018-01-21 MED ORDER — MIDAZOLAM HCL 5 MG/5ML IJ SOLN
INTRAMUSCULAR | Status: AC
  Filled 2018-01-21: qty 5

## 2018-01-21 MED ORDER — SODIUM CHLORIDE 0.9 % IV SOLN
INTRAVENOUS | Status: DC
  Administered 2018-01-21: 1000 mL via INTRAVENOUS

## 2018-01-21 MED ORDER — PROPOFOL 500 MG/50ML IV EMUL
INTRAVENOUS | Status: AC
  Filled 2018-01-21: qty 50

## 2018-01-21 MED ORDER — MIDAZOLAM HCL 5 MG/5ML IJ SOLN
INTRAMUSCULAR | Status: DC | PRN
  Administered 2018-01-21: 1 mg via INTRAVENOUS

## 2018-01-21 MED ORDER — EPHEDRINE SULFATE 50 MG/ML IJ SOLN
INTRAMUSCULAR | Status: DC | PRN
  Administered 2018-01-21: 10 mg via INTRAVENOUS

## 2018-01-21 MED ORDER — FENTANYL CITRATE (PF) 250 MCG/5ML IJ SOLN
INTRAMUSCULAR | Status: AC
  Filled 2018-01-21: qty 5

## 2018-01-21 NOTE — H&P (Signed)
Jonathon Bellows, MD [address] [place], [place], [place], [address] [address], [place], [place], [place], [address] Phone: [phone]  Fax: [phone]  Primary Care Physician:  Virginia Crews, MD   Pre-Procedure History & Physical: HPI:  Lisa [name] is a 57 y.o. female is here for an colonoscopy.   Past Medical History:  Diagnosis Date  . Allergic rhinitis 02/15/2015  . Arthritis   . BP (high blood pressure) 02/15/2015  . Cervical lymphadenopathy   . Cirrhosis ([place])   . Diabetes mellitus   . Elevated liver enzymes 09/23/2015  . Elevated liver enzymes 09/23/2015  . GERD (gastroesophageal reflux disease)   . Gonalgia 02/15/2015  . Hernia, lateral ventral 11/19/2012  . Hypertension   . Intra-abdominal and pelvic swelling, mass and lump, unspecified site 11/19/2012  . Lymphadenopathy 03/18/2017    Past Surgical History:  Procedure Laterality Date  . ABDOMINAL HYSTERECTOMY     2009  . CHOLECYSTECTOMY  1997  . CORONARY ANGIOPLASTY     stints  . CORONARY STENT INTERVENTION N/A 09/13/2016   Procedure: Coronary Stent Intervention;  Surgeon: Yolonda Kida, MD;  Location: [facility];  Service: Cardiovascular;  Laterality: N/A;  . GALLBLADDER SURGERY    . HERNIA REPAIR  7/78/2423   periumbilical with right ooph  . JOINT REPLACEMENT     left knee  . KNEE SURGERY  12/2011   total knee replacement Dr. Duayne Cal  . LEFT HEART CATH AND CORONARY ANGIOGRAPHY N/A 09/13/2016   Procedure: Left Heart Cath and Coronary Angiography;  Surgeon: Yolonda Kida, MD;  Location: [facility];  Service: Cardiovascular;  Laterality: N/A;  . LYMPH GLAND EXCISION N/A 06/04/2017   Procedure: CERVICAL LYMPH NODE BIOPSY;  Surgeon: Vickie Epley, MD;  Location: [facility];  Service: General;  Laterality: N/A;  . TUBAL LIGATION      Prior to Admission medications   Medication Sig Start Date End Date Taking? Authorizing Provider  aspirin 81 MG tablet Take  1 tablet by mouth daily.   Yes [provider]  atorvastatin (LIPITOR) 80 MG tablet Take 1 tablet (80 mg total) by mouth daily at 6 PM. 09/25/17  Yes Bacigalupo, Dionne Bucy, MD  Blood Glucose Monitoring Suppl (FIFTY50 GLUCOSE METER 2.0) W/DEVICE KIT Use as directed. 03/17/14  Yes [provider]  clopidogrel (PLAVIX) 75 MG tablet Take 75 mg by mouth daily.   Yes [provider]  glyBURIDE (DIABETA) 2.5 MG tablet Take 1 tablet by mouth daily. 10/08/13  Yes [provider]  insulin glargine (LANTUS) 100 UNIT/ML injection Inject 50 Units into the skin daily.   Yes [provider]  JARDIANCE 25 MG TABS tablet Take 25 mg daily by mouth. 02/17/17  Yes [provider]  lisinopril (PRINIVIL,ZESTRIL) 20 MG tablet Take 1 tablet (20 mg total) by mouth daily. 09/20/15  Yes Margarita Rana, MD  MULTIPLE VITAMINS-MINERALS PO Take 1 tablet by mouth daily.   Yes [provider]  omeprazole (PRILOSEC) 40 MG capsule Take 1 capsule (40 mg total) by mouth daily. 09/25/17  Yes Bacigalupo, Dionne Bucy, MD  ursodiol (ACTIGALL) 300 MG capsule Take 300 mg by mouth daily.    Yes [provider]    Allergies as of 12/27/2017 - Review Complete 12/24/2017  Allergen Reaction Noted  . Amoxicillin-pot clavulanate Swelling 02/15/2015  . Ciprofloxacin Swelling 02/15/2015  . Clarithromycin Swelling 02/15/2015    Family History  Problem Relation Age of Onset  .  Diabetes Sister   . Diabetes Sister   . Diabetes Sister   . Diabetes Mother   . Heart disease Father     Social History   Socioeconomic History  . Marital status: Married    Spouse name: Not on file  . Number of children: 1  . Years of education: Not on file  . Highest education level: Not on file  Occupational History  . Not on file  Social Needs  . Financial resource strain: Not on file  . Food insecurity:    Worry: Not on file    Inability: Not on file  . Transportation needs:     Medical: Not on file    Non-medical: Not on file  Tobacco Use  . Smoking status: Never Smoker  . Smokeless tobacco: Never Used  Substance and Sexual Activity  . Alcohol use: No  . Drug use: No  . Sexual activity: Not on file  Lifestyle  . Physical activity:    Days per week: Not on file    Minutes per session: Not on file  . Stress: Not on file  Relationships  . Social connections:    Talks on phone: Not on file    Gets together: Not on file    Attends religious service: Not on file    Active member of club or organization: Not on file    Attends meetings of clubs or organizations: Not on file    Relationship status: Not on file  . Intimate partner violence:    Fear of current or ex partner: Not on file    Emotionally abused: Not on file    Physically abused: Not on file    Forced sexual activity: Not on file  Other Topics Concern  . Not on file  Social History Narrative  . Not on file    Review of Systems: See HPI, otherwise negative ROS  Physical Exam: BP (!) 151/91   Pulse 74   Temp (!) 97.1 F (36.2 C) (Tympanic)   Resp 20   Ht 5' 9"  (1.753 m)   Wt 94.8 kg   SpO2 100%   BMI 30.86 kg/m  General:   Alert,  pleasant and cooperative in NAD Head:  Normocephalic and atraumatic. Neck:  Supple; no masses or thyromegaly. Lungs:  Clear throughout to auscultation, normal respiratory effort.    Heart:  +S1, +S2, Regular rate and rhythm, No edema. Abdomen:  Soft, nontender and nondistended. Normal bowel sounds, without guarding, and without rebound.   Neurologic:  Alert and  oriented x4;  grossly normal neurologically.  Impression/Plan: [name] is here for an colonoscopy to be performed for Screening colonoscopy average risk   Risks, benefits, limitations, and alternatives regarding  colonoscopy have been reviewed with the patient.  Questions have been answered.  All parties agreeable.   Jonathon Bellows, MD  01/21/2018, 7:43 AM

## 2018-01-21 NOTE — Anesthesia Postprocedure Evaluation (Signed)
Anesthesia Post Note  Patient: Lisa [name]  Procedure(s) Performed: COLONOSCOPY WITH PROPOFOL (N/A )  Patient location during evaluation: Endoscopy Anesthesia Type: General Level of consciousness: awake and alert Pain management: pain level controlled Vital Signs Assessment: post-procedure vital signs reviewed and stable Respiratory status: spontaneous breathing, nonlabored ventilation, respiratory function stable and patient connected to nasal cannula oxygen Cardiovascular status: blood pressure returned to baseline and stable Postop Assessment: no apparent nausea or vomiting Anesthetic complications: no     Last Vitals:  Vitals:   01/21/18 0834 01/21/18 0844  BP: 118/70 125/72  Pulse:    Resp: 16   Temp:    SpO2:      Last Pain:  Vitals:   01/21/18 0844  TempSrc:   PainSc: 0-No pain                 Korie Brabson S

## 2018-01-21 NOTE — Anesthesia Post-op Follow-up Note (Signed)
Anesthesia QCDR form completed.        

## 2018-01-21 NOTE — Transfer of Care (Signed)
Immediate Anesthesia Transfer of Care Note  Patient: Lisa [name]  Procedure(s) Performed: COLONOSCOPY WITH PROPOFOL (N/A )  Patient Location: PACU  Anesthesia Type:General  Level of Consciousness: sedated  Airway & Oxygen Therapy: Patient Spontanous Breathing and Patient connected to nasal cannula oxygen  Post-op Assessment: Report given to RN and Post -op Vital signs reviewed and stable  Post vital signs: Reviewed and stable  Last Vitals:  Vitals Value Taken Time  BP    Temp 36.1 C 01/21/2018  8:14 AM  Pulse 77 01/21/2018  8:15 AM  Resp 17 01/21/2018  8:15 AM  SpO2 100 % 01/21/2018  8:15 AM  Vitals shown include unvalidated device data.  Last Pain:  Vitals:   01/21/18 0814  TempSrc: Tympanic  PainSc: Asleep         Complications: No apparent anesthesia complications

## 2018-01-21 NOTE — Op Note (Signed)
[facility] Patient Name: Lisa [name] Procedure Date: 01/21/2018 7:46 AM MRN: [id number] Account #: [id number] Date of Birth: 1960-11-09 Admit Type: Outpatient Age: 57 Room: [facility] ENDO ROOM 4 Gender: Female Note Status: Finalized Procedure:            Colonoscopy Indications:          Screening for colorectal malignant neoplasm Providers:            Jonathon Bellows MD, MD Referring MD:         Dionne Bucy. Bacigalupo (Referring MD) Medicines:            Monitored Anesthesia Care Complications:        No immediate complications. Procedure:            Pre-Anesthesia Assessment:                       - Prior to the procedure, a History and Physical was                        performed, and patient medications, allergies and                        sensitivities were reviewed. The patient's tolerance of                        previous anesthesia was reviewed.                       - The risks and benefits of the procedure and the                        sedation options and risks were discussed with the                        patient. All questions were answered and informed                        consent was obtained.                       - ASA Grade Assessment: II - A patient with mild                        systemic disease.                       After obtaining informed consent, the colonoscope was                        passed under direct vision. Throughout the procedure,                        the patient's blood pressure, pulse, and oxygen                        saturations were monitored continuously. The                        Colonoscope was introduced through the anus and  advanced to the the cecum, identified by the                        appendiceal orifice, IC valve and transillumination.                        The colonoscopy was performed with ease. The patient                        tolerated the procedure well. The  quality of the bowel                        preparation was good. Findings:      The perianal and digital rectal examinations were normal.      Two sessile polyps were found in the sigmoid colon. The polyps were 3 to       4 mm in size. These polyps were removed with a cold snare. Resection and       retrieval were complete.      A 3 mm polyp was found in the cecum. The polyp was sessile. The polyp       was removed with a cold biopsy forceps. Resection and retrieval were       complete.      A 6 mm polyp was found in the cecum. The polyp was sessile. The polyp       was removed with a cold snare. Resection and retrieval were complete.      The exam was otherwise without abnormality on direct and retroflexion       views. Impression:           - Two 3 to 4 mm polyps in the sigmoid colon, removed                        with a cold snare. Resected and retrieved.                       - One 3 mm polyp in the cecum, removed with a cold                        biopsy forceps. Resected and retrieved.                       - One 6 mm polyp in the cecum, removed with a cold                        snare. Resected and retrieved.                       - The examination was otherwise normal on direct and                        retroflexion views. Recommendation:       - Discharge patient to home (with escort).                       - Resume previous diet.                       - Continue present medications.                       -  Await pathology results.                       - Repeat colonoscopy in 3 - 5 years for surveillance                        based on pathology results. Procedure Code(s):    --- Professional ---                       [phone], Colonoscopy, flexible; with removal of tumor(s),                        polyp(s), or other lesion(s) by snare technique                       45380, 78, Colonoscopy, flexible; with biopsy, single                        or multiple Diagnosis Code(s):     --- Professional ---                       Z12.11, Encounter for screening for malignant neoplasm                        of colon                       D12.5, Benign neoplasm of sigmoid colon                       D12.0, Benign neoplasm of cecum CPT copyright 2017 American Medical Association. All rights reserved. The codes documented in this report are preliminary and upon coder review may  be revised to meet current compliance requirements. Jonathon Bellows, MD Jonathon Bellows MD, MD 01/21/2018 8:11:32 AM This report has been signed electronically. Number of Addenda: 0 Note Initiated On: 01/21/2018 7:46 AM Scope Withdrawal Time: 0 hours 13 minutes 33 seconds  Total Procedure Duration: 0 hours 16 minutes 45 seconds       [facility]

## 2018-01-21 NOTE — Anesthesia Preprocedure Evaluation (Signed)
Anesthesia Evaluation  Patient identified by MRN, date of birth, ID band Patient awake    Reviewed: Allergy & Precautions, NPO status , Patient's Chart, lab work & pertinent test results, reviewed documented beta blocker date and time   Airway Mallampati: III  TM Distance: >3 FB     Dental  (+) Chipped   Pulmonary           Cardiovascular hypertension, Pt. on medications + Cardiac Stents and + Peripheral Vascular Disease       Neuro/Psych Anxiety    GI/Hepatic GERD  ,(+) Hepatitis -  Endo/Other  diabetes, Type 2  Renal/GU      Musculoskeletal  (+) Arthritis ,   Abdominal   Peds  Hematology   Anesthesia Other Findings   Reproductive/Obstetrics                             Anesthesia Physical Anesthesia Plan  ASA: III  Anesthesia Plan: General   Post-op Pain Management:    Induction: Intravenous  PONV Risk Score and Plan:   Airway Management Planned:   Additional Equipment:   Intra-op Plan:   Post-operative Plan:   Informed Consent: I have reviewed the patients History and Physical, chart, labs and discussed the procedure including the risks, benefits and alternatives for the proposed anesthesia with the patient or authorized representative who has indicated his/her understanding and acceptance.     Plan Discussed with: CRNA  Anesthesia Plan Comments:         Anesthesia Quick Evaluation

## 2018-01-22 ENCOUNTER — Encounter: Payer: Self-pay | Admitting: Gastroenterology

## 2018-01-24 LAB — SURGICAL PATHOLOGY

## 2018-01-27 ENCOUNTER — Encounter: Payer: Self-pay | Admitting: Gastroenterology

## 2018-03-13 ENCOUNTER — Ambulatory Visit (INDEPENDENT_AMBULATORY_CARE_PROVIDER_SITE_OTHER): Payer: 59 | Admitting: Family Medicine

## 2018-03-13 ENCOUNTER — Encounter: Payer: Self-pay | Admitting: Family Medicine

## 2018-03-13 VITALS — BP 130/81 | HR 95 | Temp 103.0°F | Wt 201.6 lb

## 2018-03-13 DIAGNOSIS — R509 Fever, unspecified: Secondary | ICD-10-CM

## 2018-03-13 DIAGNOSIS — R69 Illness, unspecified: Secondary | ICD-10-CM

## 2018-03-13 DIAGNOSIS — R52 Pain, unspecified: Secondary | ICD-10-CM | POA: Diagnosis not present

## 2018-03-13 DIAGNOSIS — J111 Influenza due to unidentified influenza virus with other respiratory manifestations: Secondary | ICD-10-CM

## 2018-03-13 MED ORDER — OSELTAMIVIR PHOSPHATE 75 MG PO CAPS: 75.0000 mg | ORAL_CAPSULE | Freq: Two times a day (BID) | ORAL | 0 refills | Status: AC

## 2018-03-13 MED ORDER — ONDANSETRON HCL 4 MG PO TABS: 4.0000 mg | ORAL_TABLET | Freq: Three times a day (TID) | ORAL | 0 refills | Status: DC | PRN

## 2018-03-13 NOTE — Progress Notes (Signed)
Patient: Lisa [name] Female    DOB: [birth year]   57 y.o.   MRN: [id number] Visit Date: 03/15/2018  Today's Provider: Lavon Paganini, MD   Chief Complaint  Patient presents with  . GI Problem   Subjective:    GI Problem  The primary symptoms include fever, fatigue, abdominal pain, nausea, vomiting and myalgias. Episode onset: Thursday. The onset was sudden. The problem has been gradually worsening.  The maximum temperature recorded prior to her arrival was 103 to 104 F. The temperature was taken by an oral thermometer.  The illness is also significant for chills.       Allergies  Allergen Reactions  . Amoxicillin-Pot Clavulanate Swelling    lips  . Ciprofloxacin Swelling    lips  . Clarithromycin Swelling    lips     Current Outpatient Medications:  .  aspirin 81 MG tablet, Take 1 tablet by mouth daily., Disp: , Rfl:  .  atorvastatin (LIPITOR) 80 MG tablet, Take 1 tablet (80 mg total) by mouth daily at 6 PM., Disp: 90 tablet, Rfl: 3 .  Blood Glucose Monitoring Suppl (FIFTY50 GLUCOSE METER 2.0) W/DEVICE KIT, Use as directed., Disp: , Rfl:  .  clopidogrel (PLAVIX) 75 MG tablet, Take 75 mg by mouth daily., Disp: , Rfl:  .  glyBURIDE (DIABETA) 2.5 MG tablet, Take 1 tablet by mouth daily., Disp: , Rfl:  .  insulin glargine (LANTUS) 100 UNIT/ML injection, Inject 50 Units into the skin daily., Disp: , Rfl:  .  JARDIANCE 25 MG TABS tablet, Take 25 mg daily by mouth., Disp: , Rfl: 6 .  lisinopril (PRINIVIL,ZESTRIL) 20 MG tablet, Take 1 tablet (20 mg total) by mouth daily., Disp: 90 tablet, Rfl: 3 .  MULTIPLE VITAMINS-MINERALS PO, Take 1 tablet by mouth daily., Disp: , Rfl:  .  omeprazole (PRILOSEC) 40 MG capsule, Take 1 capsule (40 mg total) by mouth daily., Disp: 90 capsule, Rfl: 3 .  ursodiol (ACTIGALL) 300 MG capsule, Take 300 mg by mouth daily. , Disp: , Rfl:  .  ondansetron (ZOFRAN) 4 MG tablet, Take 1 tablet (4 mg total) by mouth every 8 (eight) hours as  needed for nausea or vomiting., Disp: 30 tablet, Rfl: 0 .  oseltamivir (TAMIFLU) 75 MG capsule, Take 1 capsule (75 mg total) by mouth 2 (two) times daily for 5 days., Disp: 10 capsule, Rfl: 0  Review of Systems  Constitutional: Positive for appetite change, chills, fatigue and fever.  HENT: Negative.   Respiratory: Negative.   Gastrointestinal: Positive for abdominal pain, nausea and vomiting.  Musculoskeletal: Positive for myalgias.    Social History   Tobacco Use  . Smoking status: Never Smoker  . Smokeless tobacco: Never Used  Substance Use Topics  . Alcohol use: No   Objective:   BP 130/81 (BP Location: Left Arm, Patient Position: Sitting, Cuff Size: Normal)   Pulse 95   Temp (!) 103 F (39.4 C) (Oral)   Wt 201 lb 9.6 oz (91.4 kg)   SpO2 97%   BMI 29.77 kg/m  Vitals:   03/13/18 1401  BP: 130/81  Pulse: 95  Temp: (!) 103 F (39.4 C)  TempSrc: Oral  SpO2: 97%  Weight: 201 lb 9.6 oz (91.4 kg)     Physical Exam  Constitutional: She is oriented to person, place, and time. She appears well-developed and well-nourished. She appears ill.  +shaking chills intermittently Intermittently vomiting  HENT:  Head: Normocephalic and atraumatic.  Right Ear: Tympanic  membrane, external ear and ear canal normal.  Left Ear: Tympanic membrane, external ear and ear canal normal.  Nose: Nose normal. Right sinus exhibits no maxillary sinus tenderness and no frontal sinus tenderness. Left sinus exhibits no maxillary sinus tenderness and no frontal sinus tenderness.  Mouth/Throat: Uvula is midline, oropharynx is clear and moist and mucous membranes are normal.  Eyes: Pupils are equal, round, and reactive to light. Conjunctivae are normal. Right eye exhibits no discharge. Left eye exhibits no discharge. No scleral icterus.  Neck: Neck supple. No thyromegaly present.  Cardiovascular: Normal rate, regular rhythm, normal heart sounds and intact distal pulses.  No murmur  heard. Pulmonary/Chest: Effort normal and breath sounds normal. No respiratory distress. She has no wheezes. She has no rales.  Abdominal: Soft. She exhibits no distension. There is no tenderness. There is no guarding.  Musculoskeletal: She exhibits no edema.  Lymphadenopathy:    She has no cervical adenopathy.  Neurological: She is alert and oriented to person, place, and time.  Skin: Skin is warm and dry. Capillary refill takes less than 2 seconds. No rash noted. No pallor.  Psychiatric: She has a normal mood and affect. Her behavior is normal.  Vitals reviewed.   Results for orders placed or performed in visit on 03/13/18  POCT Influenza A/B  Result Value Ref Range   Influenza A, POC Negative Negative   Influenza B, POC Negative Negative       Assessment & Plan:   1. Fever, unspecified fever cause 2. Body aches 3. Influenza-like illness - POC Flu test negative, but patient appears to have flu-like illness with GI and URI symptoms in the setting of fever - will treat with Tamiflu - Zofran prn for N/V - appears ill, but not septic as other VSS - recommend tylenol/ibuprofen for fever/myalgias, mucinex for congestion - discussed strict return precautions and will f/u early next week    Meds ordered this encounter  Medications  . oseltamivir (TAMIFLU) 75 MG capsule    Sig: Take 1 capsule (75 mg total) by mouth 2 (two) times daily for 5 days.    Dispense:  10 capsule    Refill:  0  . ondansetron (ZOFRAN) 4 MG tablet    Sig: Take 1 tablet (4 mg total) by mouth every 8 (eight) hours as needed for nausea or vomiting.    Dispense:  30 tablet    Refill:  0     Return in about 5 days (around 03/18/2018) for illness f/u.   The entirety of the information documented in the History of Present Illness, Review of Systems and Physical Exam were personally obtained by me. Portions of this information were initially documented by [name], CMA and reviewed by me for thoroughness  and accuracy.    Virginia Crews, MD, MPH [facility] 03/15/2018 8:31 AM

## 2018-03-13 NOTE — Patient Instructions (Signed)

## 2018-03-15 LAB — POCT INFLUENZA A/B
Influenza A, POC: NEGATIVE
Influenza B, POC: NEGATIVE

## 2018-03-18 ENCOUNTER — Ambulatory Visit: Payer: 59 | Admitting: Family Medicine

## 2018-03-18 NOTE — Progress Notes (Deleted)
Patient: Lisa [name] Female    DOB: 05-21-1960   57 y.o.   MRN: [id number] Visit Date: 03/18/2018  Today's Provider: Lavon Paganini, MD   No chief complaint on file.  Subjective:    I, [name], CMA, am acting as a scribe for Lavon Paganini, MD.   HPI Influenza:  Patient presents for a 5 day follow up. Last OV was on 03/13/2018. Patient was treated with Tamiflu and Zofran. She reports good complaicne with treatment plan. She states symptoms are.     Allergies  Allergen Reactions  . Amoxicillin-Pot Clavulanate Swelling    lips  . Ciprofloxacin Swelling    lips  . Clarithromycin Swelling    lips     Current Outpatient Medications:  .  aspirin 81 MG tablet, Take 1 tablet by mouth daily., Disp: , Rfl:  .  atorvastatin (LIPITOR) 80 MG tablet, Take 1 tablet (80 mg total) by mouth daily at 6 PM., Disp: 90 tablet, Rfl: 3 .  Blood Glucose Monitoring Suppl (FIFTY50 GLUCOSE METER 2.0) W/DEVICE KIT, Use as directed., Disp: , Rfl:  .  clopidogrel (PLAVIX) 75 MG tablet, Take 75 mg by mouth daily., Disp: , Rfl:  .  glyBURIDE (DIABETA) 2.5 MG tablet, Take 1 tablet by mouth daily., Disp: , Rfl:  .  insulin glargine (LANTUS) 100 UNIT/ML injection, Inject 50 Units into the skin daily., Disp: , Rfl:  .  JARDIANCE 25 MG TABS tablet, Take 25 mg daily by mouth., Disp: , Rfl: 6 .  lisinopril (PRINIVIL,ZESTRIL) 20 MG tablet, Take 1 tablet (20 mg total) by mouth daily., Disp: 90 tablet, Rfl: 3 .  MULTIPLE VITAMINS-MINERALS PO, Take 1 tablet by mouth daily., Disp: , Rfl:  .  omeprazole (PRILOSEC) 40 MG capsule, Take 1 capsule (40 mg total) by mouth daily., Disp: 90 capsule, Rfl: 3 .  ondansetron (ZOFRAN) 4 MG tablet, Take 1 tablet (4 mg total) by mouth every 8 (eight) hours as needed for nausea or vomiting., Disp: 30 tablet, Rfl: 0 .  oseltamivir (TAMIFLU) 75 MG capsule, Take 1 capsule (75 mg total) by mouth 2 (two) times daily for 5 days., Disp: 10 capsule, Rfl: 0 .   ursodiol (ACTIGALL) 300 MG capsule, Take 300 mg by mouth daily. , Disp: , Rfl:   Review of Systems  Social History   Tobacco Use  . Smoking status: Never Smoker  . Smokeless tobacco: Never Used  Substance Use Topics  . Alcohol use: No   Objective:   There were no vitals taken for this visit. There were no vitals filed for this visit.   Physical Exam      Assessment & Plan:           Lavon Paganini, MD  [facility]

## 2018-03-25 ENCOUNTER — Telehealth: Payer: Self-pay | Admitting: Family Medicine

## 2018-03-25 MED ORDER — TERCONAZOLE 0.4 % VA CREA: 1.0000 | TOPICAL_CREAM | Freq: Every day | VAGINAL | 0 refills | Status: AC

## 2018-03-25 NOTE — Telephone Encounter (Signed)
Patient was advised. Expressed understanding.  

## 2018-03-25 NOTE — Telephone Encounter (Signed)
Terazol cream prescribed.  Use this vaginally nightly x7 nights  Bacigalupo, Dionne Bucy, MD, MPH [facility] 03/25/2018 2:31 PM

## 2018-03-25 NOTE — Telephone Encounter (Signed)
Does she think she has a yeast infection (thick, white discharge with itching) or is skin just irritated.  If patient is skin irritation, could use hydrocortisone cream OTC twice daily.  If she believes she has a yeast infection, we can prescribe Diflucan  Brittane Grudzinski, Dionne Bucy, MD, MPH [facility] 03/25/2018 10:02 AM

## 2018-03-25 NOTE — Telephone Encounter (Signed)
Patient states she is experiencing external vaginal itching. She is requesting a RX for the vaginal cream instead of Diflucan for a yeast infection.

## 2018-03-25 NOTE — Telephone Encounter (Signed)
Very irritated vaginally since she has taken the medication Dr. Jacinto Reap. Prescribed. Pt needing something to help - cream or pill.  Thanks, [employer]

## 2018-04-01 ENCOUNTER — Other Ambulatory Visit: Payer: Self-pay | Admitting: Family Medicine

## 2018-04-01 NOTE — Telephone Encounter (Signed)
[facility] faxed refill request for the following medications:  omeprazole (PRILOSEC) 40 MG capsule  Please advise.

## 2018-04-15 ENCOUNTER — Ambulatory Visit (INDEPENDENT_AMBULATORY_CARE_PROVIDER_SITE_OTHER): Payer: 59 | Admitting: Vascular Surgery

## 2018-04-15 ENCOUNTER — Encounter (INDEPENDENT_AMBULATORY_CARE_PROVIDER_SITE_OTHER): Payer: 59

## 2018-05-13 DIAGNOSIS — K7581 Nonalcoholic steatohepatitis (NASH): Secondary | ICD-10-CM | POA: Diagnosis not present

## 2018-05-13 DIAGNOSIS — Z23 Encounter for immunization: Secondary | ICD-10-CM | POA: Diagnosis not present

## 2018-05-13 DIAGNOSIS — K743 Primary biliary cirrhosis: Secondary | ICD-10-CM | POA: Diagnosis not present

## 2018-05-16 DIAGNOSIS — L299 Pruritus, unspecified: Secondary | ICD-10-CM | POA: Diagnosis not present

## 2018-05-16 DIAGNOSIS — L503 Dermatographic urticaria: Secondary | ICD-10-CM | POA: Diagnosis not present

## 2018-05-16 DIAGNOSIS — E119 Type 2 diabetes mellitus without complications: Secondary | ICD-10-CM | POA: Diagnosis not present

## 2018-05-30 DIAGNOSIS — K743 Primary biliary cirrhosis: Secondary | ICD-10-CM | POA: Diagnosis not present

## 2018-06-17 DIAGNOSIS — L298 Other pruritus: Secondary | ICD-10-CM | POA: Diagnosis not present

## 2018-06-17 DIAGNOSIS — L299 Pruritus, unspecified: Secondary | ICD-10-CM | POA: Diagnosis not present

## 2018-06-24 ENCOUNTER — Ambulatory Visit: Payer: 59 | Admitting: Family Medicine

## 2018-07-08 DIAGNOSIS — E113293 Type 2 diabetes mellitus with mild nonproliferative diabetic retinopathy without macular edema, bilateral: Secondary | ICD-10-CM | POA: Diagnosis not present

## 2018-07-08 LAB — HM DIABETES EYE EXAM

## 2018-08-05 DIAGNOSIS — L298 Other pruritus: Secondary | ICD-10-CM | POA: Diagnosis not present

## 2018-08-05 DIAGNOSIS — L501 Idiopathic urticaria: Secondary | ICD-10-CM | POA: Diagnosis not present

## 2018-08-05 DIAGNOSIS — L299 Pruritus, unspecified: Secondary | ICD-10-CM | POA: Diagnosis not present

## 2018-08-19 ENCOUNTER — Other Ambulatory Visit: Payer: Self-pay | Admitting: *Deleted

## 2018-08-19 DIAGNOSIS — I10 Essential (primary) hypertension: Secondary | ICD-10-CM

## 2018-08-19 MED ORDER — LISINOPRIL 20 MG PO TABS: 20.0000 mg | ORAL_TABLET | Freq: Every day | ORAL | 3 refills | Status: DC

## 2018-08-19 MED ORDER — OMEPRAZOLE 40 MG PO CPDR: 40.0000 mg | DELAYED_RELEASE_CAPSULE | Freq: Every day | ORAL | 3 refills | Status: DC

## 2018-08-19 NOTE — Telephone Encounter (Signed)
Patient is requesting 90 day supply due to insurance changes. Please advise refill for lisinopril?

## 2018-09-05 DIAGNOSIS — Z8719 Personal history of other diseases of the digestive system: Secondary | ICD-10-CM | POA: Diagnosis not present

## 2018-09-05 DIAGNOSIS — L299 Pruritus, unspecified: Secondary | ICD-10-CM | POA: Diagnosis not present

## 2018-09-05 DIAGNOSIS — Z872 Personal history of diseases of the skin and subcutaneous tissue: Secondary | ICD-10-CM | POA: Diagnosis not present

## 2018-09-19 DIAGNOSIS — L299 Pruritus, unspecified: Secondary | ICD-10-CM | POA: Diagnosis not present

## 2018-09-19 DIAGNOSIS — Z872 Personal history of diseases of the skin and subcutaneous tissue: Secondary | ICD-10-CM | POA: Diagnosis not present

## 2018-09-19 DIAGNOSIS — R21 Rash and other nonspecific skin eruption: Secondary | ICD-10-CM | POA: Diagnosis not present

## 2018-09-19 DIAGNOSIS — Z8719 Personal history of other diseases of the digestive system: Secondary | ICD-10-CM | POA: Diagnosis not present

## 2018-09-26 DIAGNOSIS — L810 Postinflammatory hyperpigmentation: Secondary | ICD-10-CM | POA: Diagnosis not present

## 2018-09-30 IMAGING — CT CT ABD-PELV W/ CM
2 of 5 series · 14 of 46 positions shown, 16 images · IV contrast (APPLIED)
Comparison: 07/29/2010

CLINICAL DATA: Seen at few days ago for cystitis without
improvement after finishing antibiotic. Persistent fever and
suprapubic pain.

EXAM:
CT ABDOMEN AND PELVIS WITH CONTRAST
TECHNIQUE: Multidetector CT imaging of the abdomen and pelvis was performed
using the standard protocol following bolus administration of
intravenous contrast.
CONTRAST:  100mL I7PCGX-4ZZ IOPAMIDOL (I7PCGX-4ZZ) INJECTION 61%

[Series 2: routine abd/pel with · axial · 0.76mm/px · z∈[-776,-331]mm · 11 of 101 slices shown, 13 images]
[im 6/101  soft-tissue]
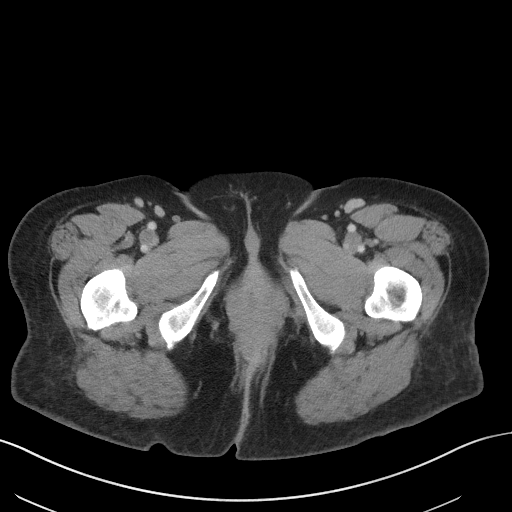
[im 6/101  bone]
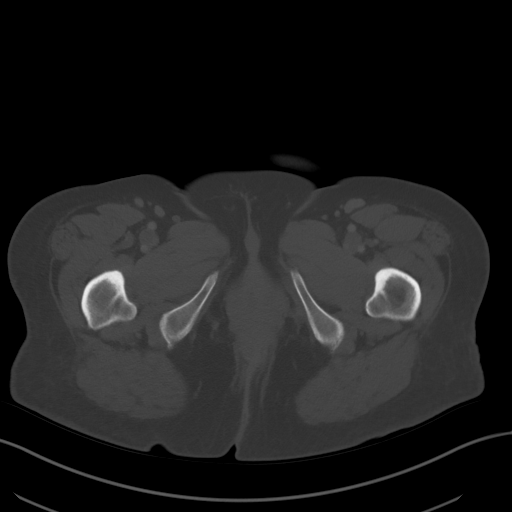
[im 17/101  soft-tissue]
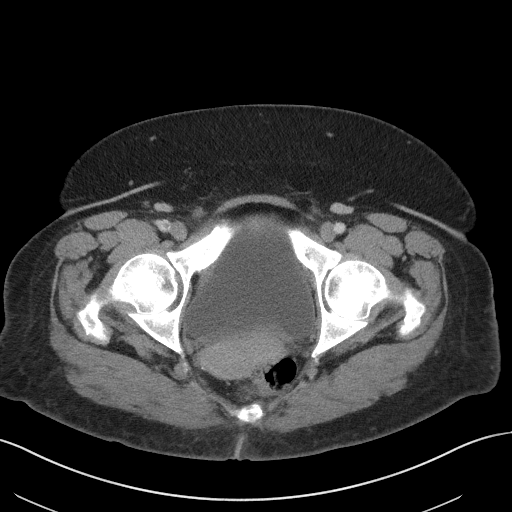
[im 23/101  soft-tissue]
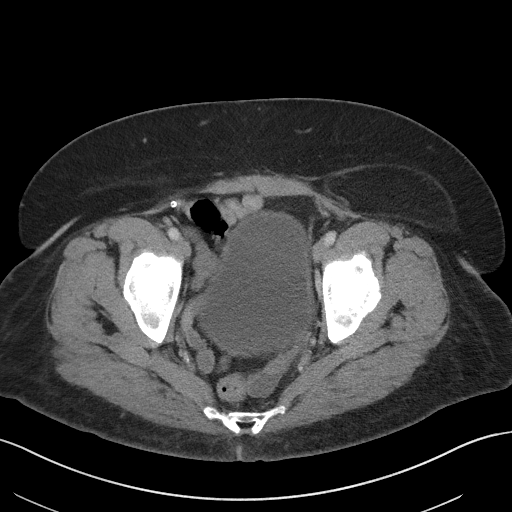
[im 34/101  soft-tissue]
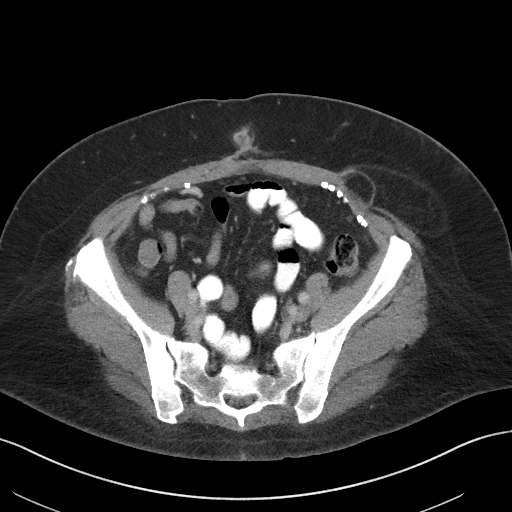
[im 39/101  soft-tissue]
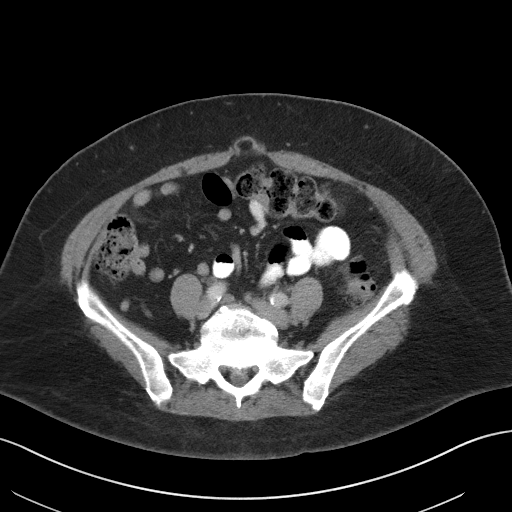
[im 51/101  soft-tissue]
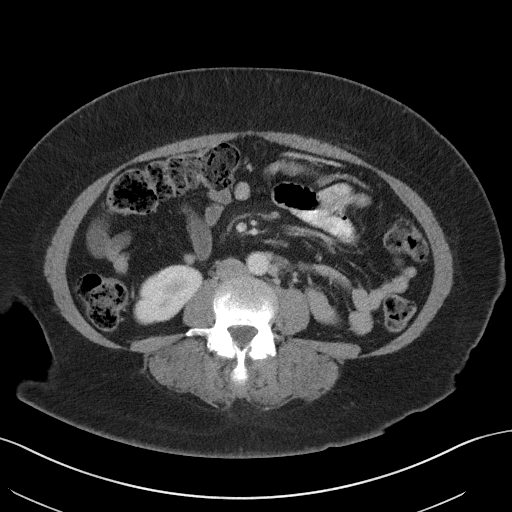
[im 62/101  soft-tissue]
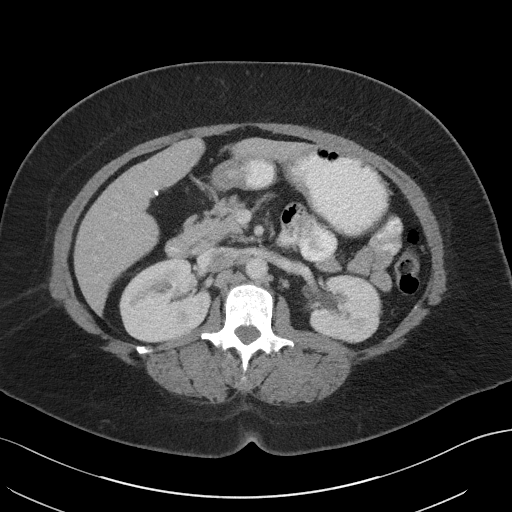
[im 67/101  soft-tissue]
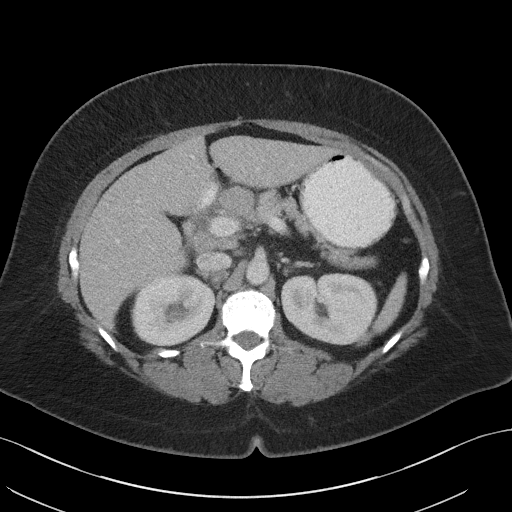
[im 78/101  soft-tissue]
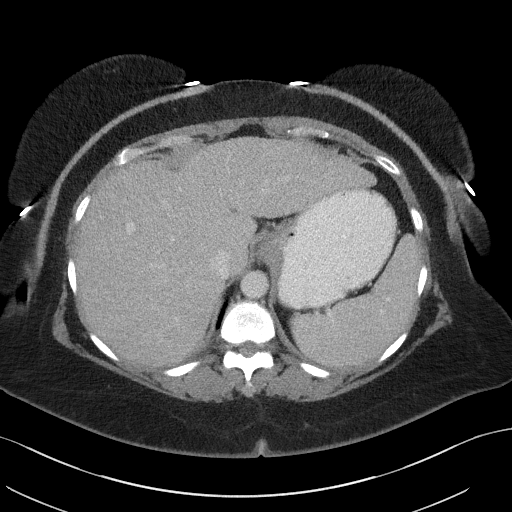
[im 78/101  bone]
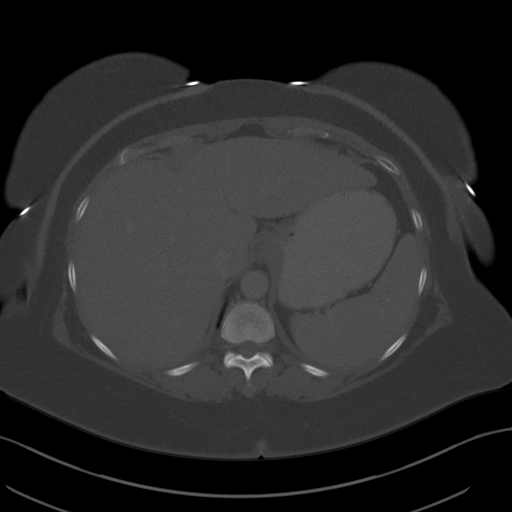
[im 84/101  soft-tissue]
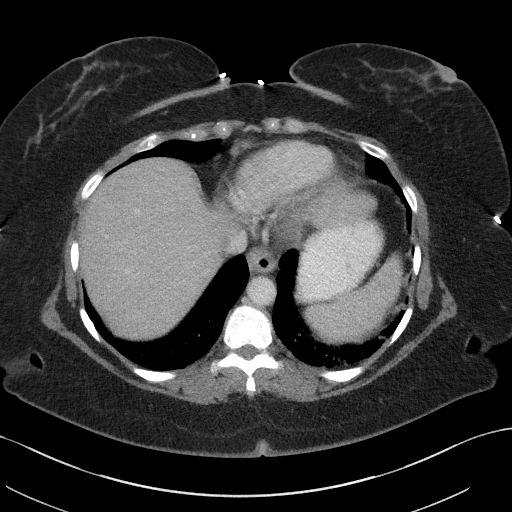
[im 95/101  soft-tissue]
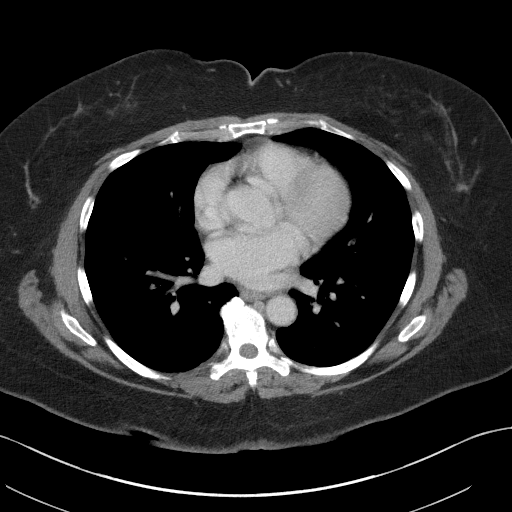

[Series 5: coronal st · coronal · 0.68mm/px · 3 of 87 slices shown]
[im 29/87  soft-tissue]
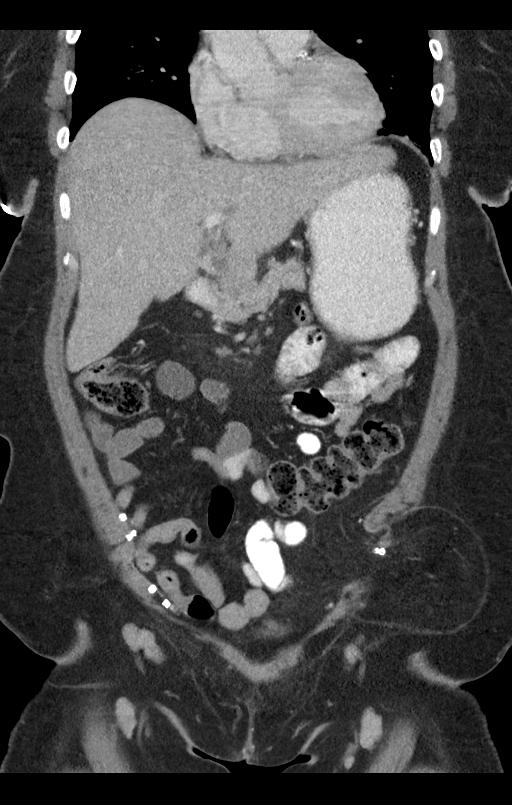
[im 39/87  soft-tissue]
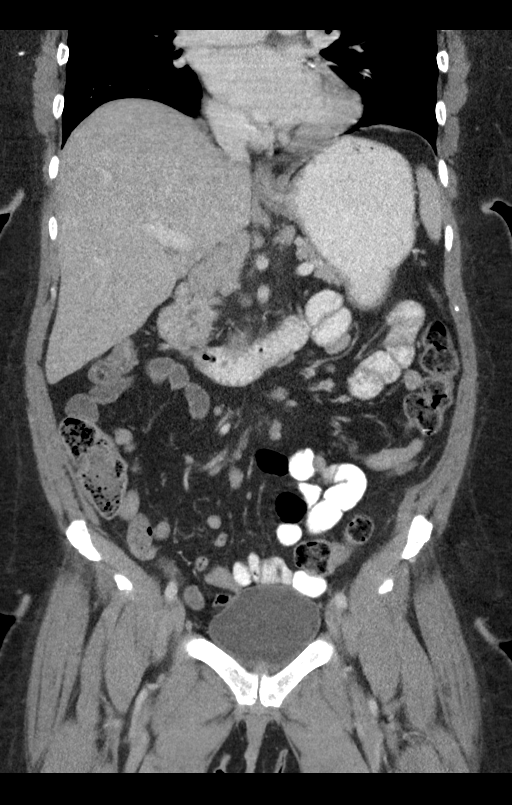
[im 48/87  soft-tissue]
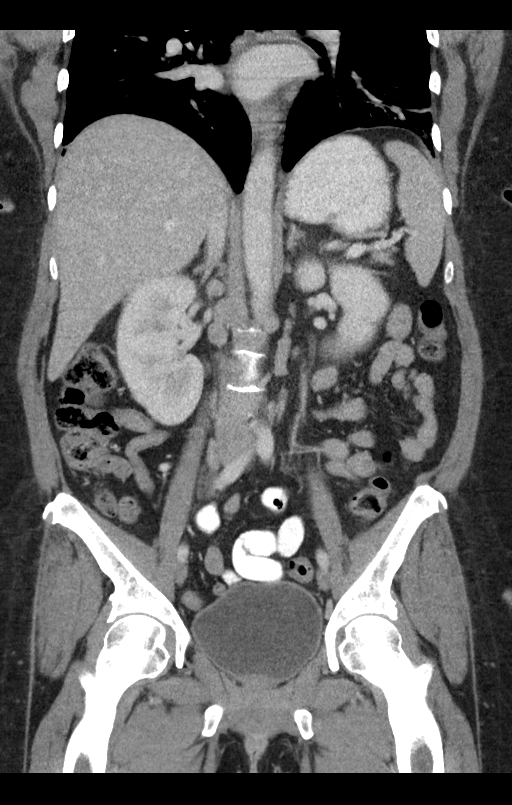

[14 of 46 positions shown; findings below may reference images not displayed]

FINDINGS: Lower chest: 7 x 5 mm nodule over the lingula. Mild opacification
over the bilateral lower lobes left greater than right which may be
due to atelectasis or infection. Heart is normal size. There is
calcified plaque over the left and descending and right coronary
arteries. 1.1 cm right subcarinal lymph node. Couple small
subcentimeter right pericardial phrenic lymph nodes.

Hepatobiliary: Previous cholecystectomy. Liver and biliary tree are
normal.

Pancreas: Within normal.

Spleen: Within normal.

Adrenals/Urinary Tract: Adrenal glands are normal. Kidneys are
normal in size without hydronephrosis or nephrolithiasis.
Subcentimeter hypodensity over the upper pole cortex of the left
kidney likely a cyst but too small to characterize. Ureters and
bladder are normal.

Stomach/Bowel: Stomach and small bowel are within normal. Appendix
is normal. Colon is within normal.

Vascular/Lymphatic: Minimal calcified plaque over the distal
abdominal aorta. Moderate adenopathy over the upper abdomen in the
region of the gastrohepatic ligament and celiac axis with the 1.9 cm
node over the celiac axis and 1.9 cm lymph node in the portacaval
region when measured by short axis. Mild periaortic adenopathy with
the largest node measuring 1.4 cm by short axis in the left
periaortic region.

Reproductive: Unremarkable.

Other: No free fluid or focal inflammatory change. Evidence of
previous right inguinal hernia repair as well as previous left lower
quadrant ventral hernia repair. There is recurrent ventral hernia
over the left lower quadrant just below the previous repair site
with fascial defect measuring 3.1 cm in diameter as this hernia
contains only mesenteric fat. Small umbilical hernia containing only
mesenteric fat.

Musculoskeletal: Mild degenerate change of the spine hips. Mild
sclerosis along the sacroiliac joints right worse than left.
IMPRESSION: No acute findings in abdomen/pelvis.

Heterogeneous opacification over the lung bases left greater than
right likely atelectasis, although cannot exclude infection. 6 mm
nodule over the lingula. Recommend follow-up CT 4-6 weeks.

Nonspecific adenopathy over the lower chest and upper abdomen as
described most prominent over the porta hepatis with the largest
node measuring 1.9 cm by short axis. No definite primary neoplasm
identified, although cannot exclude lymphoma/leukemia. Consider
complete chest CT for further evaluation.

Bilateral hernia repairs as described. There is a recurrent hernia
just inferior to the left lower abdominal hernia repair site
containing only peritoneal fat. Tiny umbilical hernia containing
only peritoneal fat.

Subcentimeter hypodensity over the upper pole left kidney too small
to characterize but likely a cyst.

Atherosclerotic coronary artery disease.

Aortic Atherosclerosis ([id number].U).

## 2018-10-01 ENCOUNTER — Telehealth: Payer: Self-pay

## 2018-10-01 NOTE — Telephone Encounter (Signed)
Needs visit, can be virtual.

## 2018-10-01 NOTE — Telephone Encounter (Signed)
Please review for Dr. Brita Romp.    Pt states she has a yeast infection and would like the vaginal cream sent to [facility] [address] Number: [phone].   Thanks,   -Mickel Baas

## 2018-10-01 NOTE — Telephone Encounter (Signed)
Please review and advise.Kw

## 2018-10-01 NOTE — Telephone Encounter (Signed)
appt was scheduled for Wed morning, patient preferred to speak with PCP. KW

## 2018-10-02 ENCOUNTER — Ambulatory Visit (INDEPENDENT_AMBULATORY_CARE_PROVIDER_SITE_OTHER): Payer: 59 | Admitting: Family Medicine

## 2018-10-02 DIAGNOSIS — B373 Candidiasis of vulva and vagina: Secondary | ICD-10-CM

## 2018-10-02 DIAGNOSIS — B3731 Acute candidiasis of vulva and vagina: Secondary | ICD-10-CM

## 2018-10-02 MED ORDER — TERCONAZOLE 0.4 % VA CREA: 1.0000 | TOPICAL_CREAM | Freq: Every day | VAGINAL | 0 refills | Status: AC

## 2018-10-02 NOTE — Progress Notes (Signed)
Patient: Lisa [name] Female    DOB: [birth year]   58 y.o.   MRN: [id number] Visit Date: 10/02/2018  Today's Provider: Lavon Paganini, MD   Chief Complaint  Patient presents with  . Vaginal Itching   Subjective:    I, Porsha McClurkin CMA, am acting as a scribe for Lavon Paganini, MD.   Virtual Visit via Video Note  I connected with Lisa [name] on 10/02/18 at  8:20 AM EDT by a video enabled telemedicine application and verified that I am speaking with the correct person using two identifiers.   Patient location: home Provider location: [place] involved in the visit: patient, provider   I discussed the limitations of evaluation and management by telemedicine and the availability of in person appointments. The patient expressed understanding and agreed to proceed.   Vaginal Itching  The patient's primary symptoms include genital itching. The patient's pertinent negatives include no genital odor, vaginal bleeding or vaginal discharge. The current episode started in the past 7 days. The problem occurs constantly. The problem has been gradually worsening. The patient is experiencing no pain. Associated symptoms include back pain and urgency. Pertinent negatives include no abdominal pain. The symptoms are aggravated by urinating.   Slight burning externally after urinating hasnt looked for rashes Feels irritated to the touch   Allergies  Allergen Reactions  . Amoxicillin-Pot Clavulanate Swelling    lips  . Ciprofloxacin Swelling    lips  . Clarithromycin Swelling    lips     Current Outpatient Medications:  .  aspirin 81 MG tablet, Take 1 tablet by mouth daily., Disp: , Rfl:  .  atorvastatin (LIPITOR) 80 MG tablet, Take 1 tablet (80 mg total) by mouth daily at 6 PM., Disp: 90 tablet, Rfl: 3 .  Blood Glucose Monitoring Suppl (FIFTY50 GLUCOSE METER 2.0) W/DEVICE KIT, Use as directed., Disp: , Rfl:  .  clopidogrel  (PLAVIX) 75 MG tablet, Take 75 mg by mouth daily., Disp: , Rfl:  .  glyBURIDE (DIABETA) 2.5 MG tablet, Take 1 tablet by mouth daily., Disp: , Rfl:  .  insulin glargine (LANTUS) 100 UNIT/ML injection, Inject 50 Units into the skin daily., Disp: , Rfl:  .  JARDIANCE 25 MG TABS tablet, Take 25 mg daily by mouth., Disp: , Rfl: 6 .  lisinopril (PRINIVIL,ZESTRIL) 20 MG tablet, Take 1 tablet (20 mg total) by mouth daily., Disp: 90 tablet, Rfl: 3 .  MULTIPLE VITAMINS-MINERALS PO, Take 1 tablet by mouth daily., Disp: , Rfl:  .  omeprazole (PRILOSEC) 40 MG capsule, Take 1 capsule (40 mg total) by mouth daily., Disp: 90 capsule, Rfl: 3 .  ondansetron (ZOFRAN) 4 MG tablet, Take 1 tablet (4 mg total) by mouth every 8 (eight) hours as needed for nausea or vomiting., Disp: 30 tablet, Rfl: 0 .  ursodiol (ACTIGALL) 300 MG capsule, Take 300 mg by mouth daily. , Disp: , Rfl:  .  terconazole (TERAZOL 7) 0.4 % vaginal cream, Place 1 applicator vaginally at bedtime for 7 days., Disp: 45 g, Rfl: 0  Review of Systems  Constitutional: Negative.   Respiratory: Negative.   Gastrointestinal: Negative for abdominal pain.  Genitourinary: Positive for urgency. Negative for vaginal discharge.  Musculoskeletal: Positive for back pain.  Neurological: Negative.     Social History   Tobacco Use  . Smoking status: Never Smoker  . Smokeless tobacco: Never Used  Substance Use Topics  . Alcohol use: No  Objective:   There were no vitals taken for this visit. There were no vitals filed for this visit.   Physical Exam Constitutional:      Appearance: Normal appearance.  Pulmonary:     Effort: Pulmonary effort is normal. No respiratory distress.  Neurological:     Mental Status: She is alert and oriented to person, place, and time.  Psychiatric:        Mood and Affect: Mood normal.        Behavior: Behavior normal.         Assessment & Plan   Follow Up Instructions: I discussed the assessment and  treatment plan with the patient. The patient was provided an opportunity to ask questions and all were answered. The patient agreed with the plan and demonstrated an understanding of the instructions.   The patient was advised to call back or seek an in-person evaluation if the symptoms worsen or if the condition fails to improve as anticipated  1. Vaginal candidiasis - new problem - does not seem to have UTI symptoms - suspect Jardiance may play a role - discussed need to hold during treatment with patient - may need to consider alternative if has recurrent yeast infections - treat with terconazole vaginally and topically on external genitalia x7 days - return precautions discussed   Meds ordered this encounter  Medications  . terconazole (TERAZOL 7) 0.4 % vaginal cream    Sig: Place 1 applicator vaginally at bedtime for 7 days.    Dispense:  45 g    Refill:  0     Return if symptoms worsen or fail to improve.   The entirety of the information documented in the History of Present Illness, Review of Systems and Physical Exam were personally obtained by me. Portions of this information were initially documented by [facility], CMA and reviewed by me for thoroughness and accuracy.    Placida Cambre, Dionne Bucy, MD MPH [facility]

## 2018-11-05 LAB — COMPREHENSIVE METABOLIC PANEL
Albumin: 4 (ref 3.5–5.0)
Calcium: 9.7 (ref 8.7–10.7)
GFR calc Af Amer: 111
GFR calc non Af Amer: 96

## 2018-11-05 LAB — BASIC METABOLIC PANEL
BUN: 16 (ref 4–21)
CO2: 26 — AB (ref 13–22)
Chloride: 95 — AB (ref 99–108)
Creatinine: 0.7 (ref 0.5–1.1)
Glucose: 157
Potassium: 4 (ref 3.4–5.3)
Sodium: 137 (ref 137–147)

## 2018-11-05 LAB — HEPATIC FUNCTION PANEL
Alkaline Phosphatase: 843 — AB (ref 25–125)
Bilirubin, Total: 0.8

## 2018-11-05 LAB — LIPID PANEL
Cholesterol: 355 — AB (ref 0–200)
HDL: 133 — AB (ref 35–70)
LDL Cholesterol: 207
Triglycerides: 74 (ref 40–160)

## 2018-11-05 LAB — TSH: TSH: 1.18 (ref 0.41–5.90)

## 2018-11-05 LAB — MICROALBUMIN, URINE: Microalb, Ur: 47.1

## 2018-11-06 IMAGING — CT CT BIOPSY
1 of 2 series · 10 of 14 positions shown, 13 images · non-contrast
Comparison: none

INDICATION: 56-year-old with lymphadenopathy. Patient will get a CT-guided lymph
node biopsy immediately following this procedure.

[Series 4: i-spiral 5.0 b30f · axial · 0.71mm/px · z∈[-91,-25]mm · 10 of 25 slices shown, 13 images]
[im 3/25  soft-tissue]
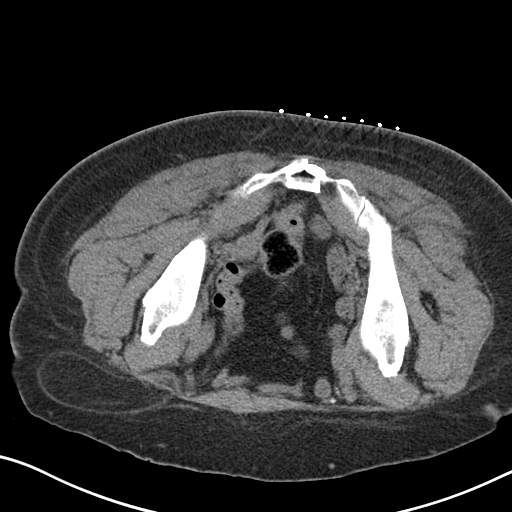
[im 3/25  bone]
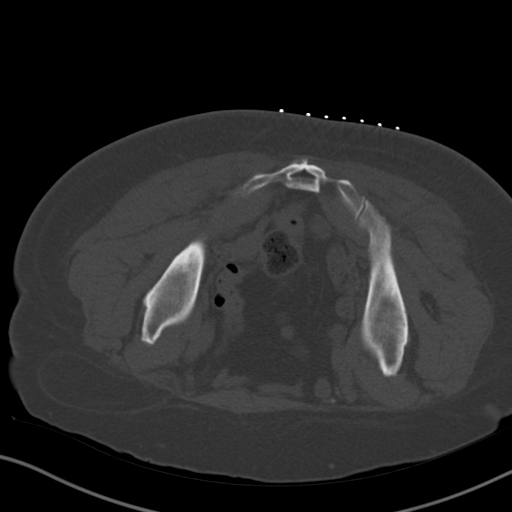
[im 5/25  bone]
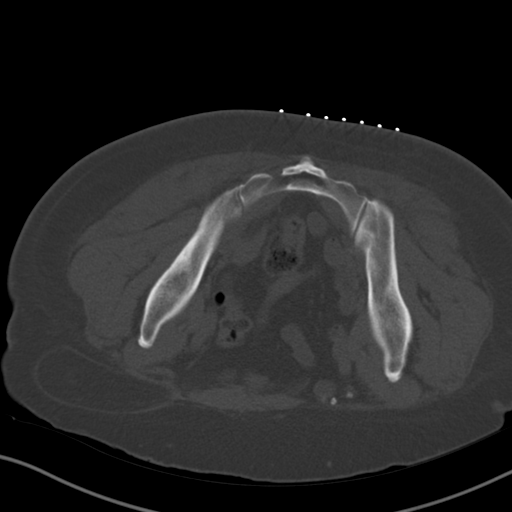
[im 7/25  bone]
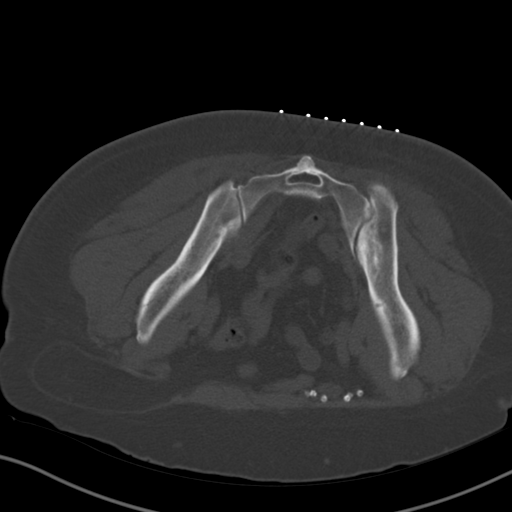
[im 9/25  bone]
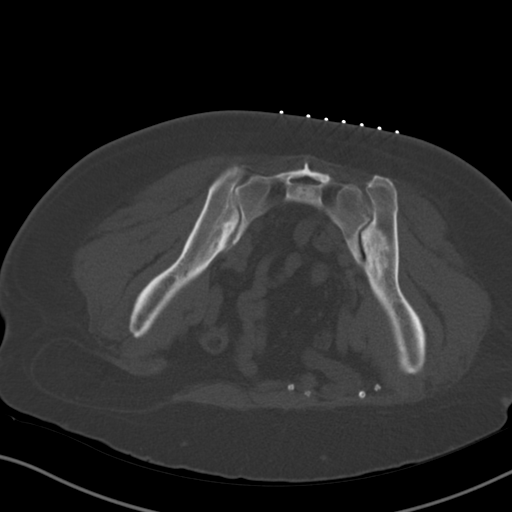
[im 11/25  soft-tissue]
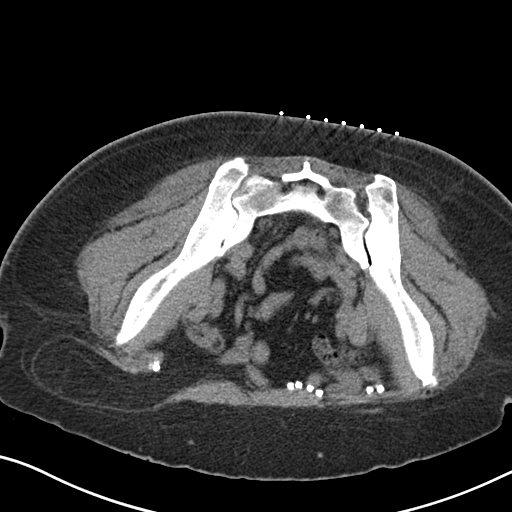
[im 11/25  bone]
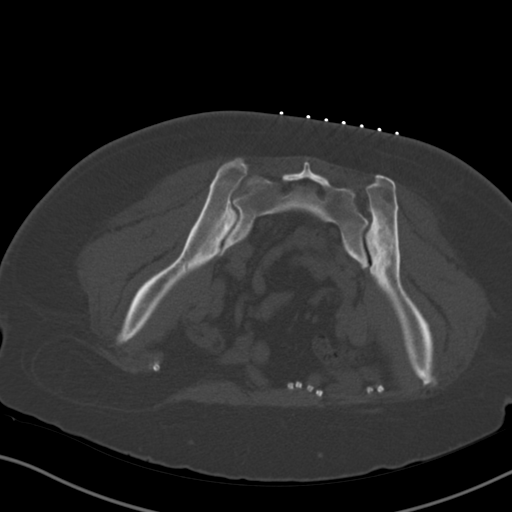
[im 14/25  bone]
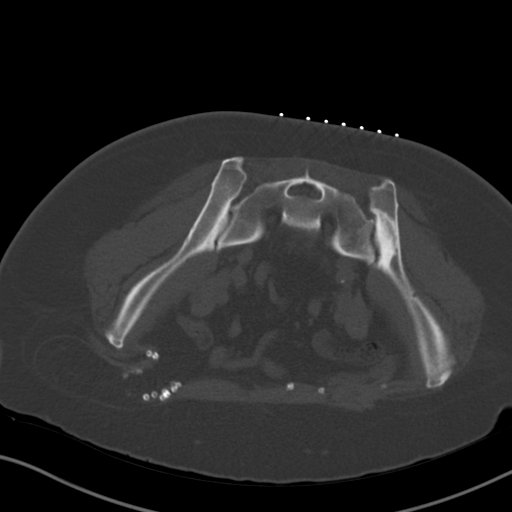
[im 16/25  bone]
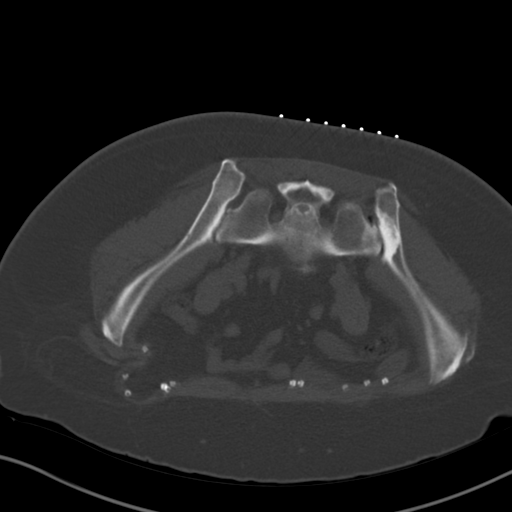
[im 18/25  bone]
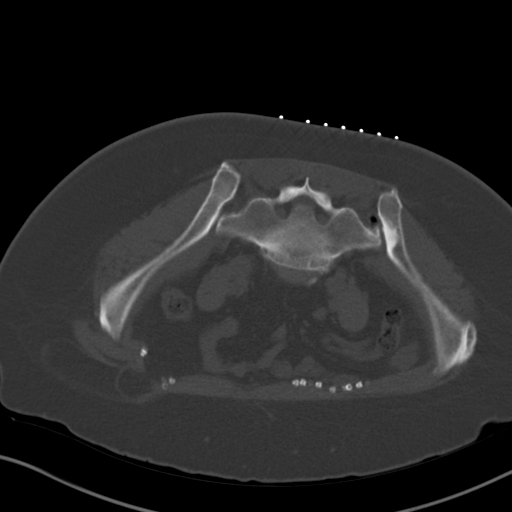
[im 20/25  soft-tissue]
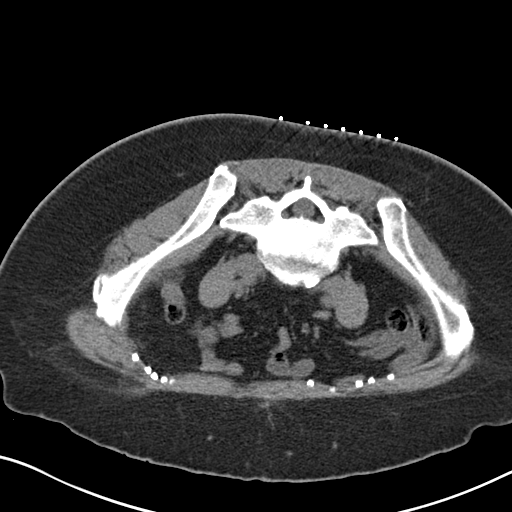
[im 20/25  bone]
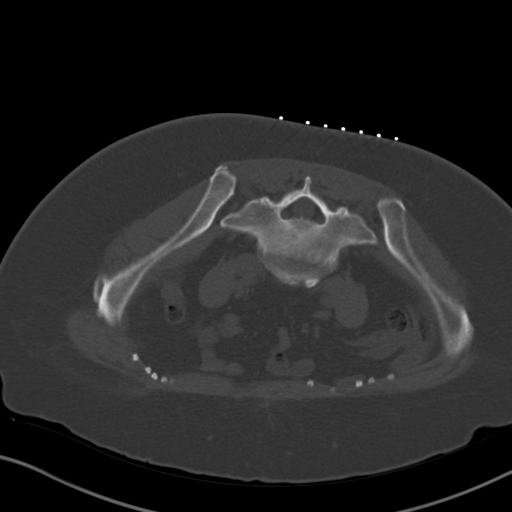
[im 22/25  bone]
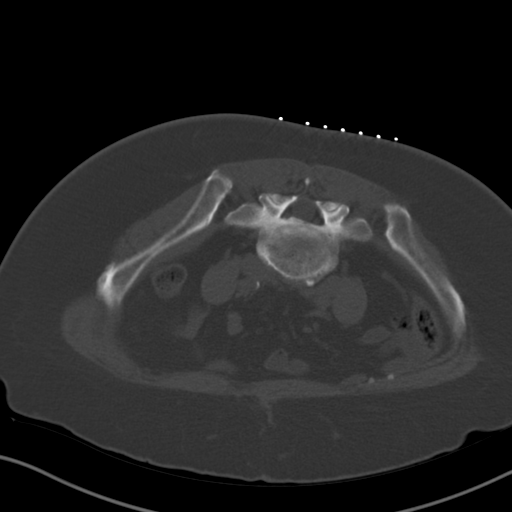

[10 of 14 positions shown; findings below may reference images not displayed]

EXAM:
CT GUIDED BONE MARROW ASPIRATES AND BIOPSY

MEDICATIONS:
None.

ANESTHESIA/SEDATION:
Fentanyl 125 mcg IV; Versed 3.0 mg IV

Moderate Sedation Time:  24 minutes

The patient was continuously monitored during the procedure by the
interventional radiology nurse under my direct supervision.

COMPLICATIONS:
None immediate.

PROCEDURE:
The procedure was explained to the patient. The risks and benefits
of the procedure were discussed and the patient's questions were
addressed. Informed consent was obtained from the patient. The
patient was placed prone on CT table. Images of the pelvis were
obtained. The right side of back was prepped and draped in sterile
fashion. The skin and right posterior ilium were anesthetized with
1% lidocaine. 11 gauge bone needle was directed into the right ilium
with CT guidance. Two aspirates and two core biopsies were obtained.
Bandage placed over the puncture site.
FINDINGS: Large amount of sclerosis involving the right ilium adjacent to the
SI joint. Core biopsies were obtained near this sclerotic bone.
IMPRESSION: CT guided bone marrow aspiration and core biopsy.

## 2019-01-08 ENCOUNTER — Other Ambulatory Visit: Payer: Self-pay | Admitting: Family Medicine

## 2019-01-08 MED ORDER — TERCONAZOLE 0.4 % VA CREA: 1.0000 | TOPICAL_CREAM | Freq: Every day | VAGINAL | 0 refills | Status: DC

## 2019-01-08 NOTE — Telephone Encounter (Signed)
Patient was called and stated [facility] and medication send into pharmacy.

## 2019-01-08 NOTE — Telephone Encounter (Signed)
Rx pended.  Need to know what pharmacy to send it to.  OK to send.  Bac[place]al[place], Dionne Bucy, MD, MPH [facility]

## 2019-01-08 NOTE — Telephone Encounter (Signed)
Pt wants to know if she can get a refill on Teronazole? 0.4 %  She uses this for vaginal  Itches  CB#  [phone]  [place]

## 2019-01-20 LAB — CBC AND DIFFERENTIAL
HCT: 40 (ref 36–46)
Hemoglobin: 12 (ref 12.0–16.0)
Platelets: 123 — AB (ref 150–399)
WBC: 6.5

## 2019-06-04 LAB — HEMOGLOBIN A1C: Hemoglobin A1C: 10.5

## 2019-06-30 ENCOUNTER — Telehealth: Payer: Self-pay | Admitting: Family Medicine

## 2019-06-30 ENCOUNTER — Other Ambulatory Visit: Payer: Self-pay | Admitting: Family Medicine

## 2019-06-30 MED ORDER — OMEPRAZOLE 40 MG PO CPDR: 40.0000 mg | DELAYED_RELEASE_CAPSULE | Freq: Every day | ORAL | 0 refills | Status: DC

## 2019-06-30 NOTE — Telephone Encounter (Signed)
[facility] faxed refill request for the following medications:  omeprazole (PRILOSEC) 40 MG capsule   Please advise.

## 2019-06-30 NOTE — Telephone Encounter (Signed)
Patient advised that she needs an office visit before any future refill. Patient refused to schedule at this time. Reports she will call back.

## 2019-06-30 NOTE — Telephone Encounter (Signed)
Copied from [place] [phone]. Topic: General - Other >> Jun 30, 2019  3:21 PM Keene Breath wrote: Reason for CRM: Patient would like the nurse or doctor to approve a 30 day supply of omeprazole (PRILOSEC) 40 MG capsule, until her order comes in from [facility].Marland Kitchen  Please advise and call to confirm at [phone]

## 2019-07-17 NOTE — Progress Notes (Signed)
Patient: Lisa [name], Female    DOB: 17-Feb-1961, 59 y.o.   MRN: [id number] Visit Date: 07/18/2019  Today's Provider: Lavon Paganini, MD   Chief Complaint  Patient presents with  . Annual Exam   Subjective:  Lisa [name] is a 59 y.o. female who presents today for health maintenance and complete physical. She feels fairly well. She reports exercising none but active job. She reports she is sleeping fairly well.  01/21/18 Colonoscopy-Tubular adenoma and hyperplastic polyp, both are  negative for high grade dysplasia and malignancy. 04/26/16 Mammogram and ultrasound-Benign dilated ducts are identified in the subareolar right breast which corresponds with the mammographic finding. No mammographic or targeted sonographic evidence of right breast malignancy.  Can not find that patient has returned for annual mammogram Pap Smear-patient is post hysterectomy  Patient complains of abdominal pain, foul smelling urine and vaginal irritation.  Also reports fish smell to vagina  Immunization History  Administered Date(s) Administered  . Pneumococcal Polysaccharide-23 09/20/2015  . Tdap 12/08/2014     Review of Systems  Constitutional: Negative.   HENT: Negative.   Eyes: Negative.   Respiratory: Negative.   Cardiovascular: Negative.   Gastrointestinal: Positive for abdominal pain.  Endocrine: Negative.   Genitourinary: Positive for vaginal discharge.       Vaginal itching, and odor  Musculoskeletal: Positive for back pain.  Skin: Negative.   Allergic/Immunologic: Negative.   Neurological: Negative.   Hematological: Negative.   Psychiatric/Behavioral: Negative.     Social History   Socioeconomic History  . Marital status: Married    Spouse name: Not on file  . Number of children: 1  . Years of education: Not on file  . Highest education level: Not on file  Occupational History  . Not on file  Tobacco Use  . Smoking status: Never Smoker  . Smokeless tobacco:  Never Used  Substance and Sexual Activity  . Alcohol use: No  . Drug use: No  . Sexual activity: Not on file  Other Topics Concern  . Not on file  Social History Narrative  . Not on file   Social Determinants of Health   Financial Resource Strain:   . Difficulty of Paying Living Expenses:   Food Insecurity:   . Worried About Charity fundraiser in the Last Year:   . Arboriculturist in the Last Year:   Transportation Needs:   . Film/video editor (Medical):   Marland Kitchen Lack of Transportation (Non-Medical):   Physical Activity:   . Days of Exercise per Week:   . Minutes of Exercise per Session:   Stress:   . Feeling of Stress :   Social Connections:   . Frequency of Communication with Friends and Family:   . Frequency of Social Gatherings with Friends and Family:   . Attends Religious Services:   . Active Member of Clubs or Organizations:   . Attends Archivist Meetings:   Marland Kitchen Marital Status:   Intimate Partner Violence:   . Fear of Current or Ex-Partner:   . Emotionally Abused:   Marland Kitchen Physically Abused:   . Sexually Abused:     Patient Active Problem List   Diagnosis Date Noted  . Atherosclerosis of aorta ([place]) 07/18/2019  . Varicose veins of both lower extremities with pain 01/14/2018  . Bilateral lower extremity edema 01/14/2018  . S/P drug eluting coronary stent placement 09/13/2016  . NASH (nonalcoholic steatohepatitis) 03/13/2016  . Primary biliary cholangitis ([place]) 01/14/2016  . Elevated liver  enzymes 09/23/2015  . Adaptation reaction 02/15/2015  . Allergic rhinitis 02/15/2015  . Diabetes mellitus, type 2 ([place]) 02/15/2015  . Foot pain 02/15/2015  . Big thyroid 02/15/2015  . BP (high blood pressure) 02/15/2015  . LBP (low back pain) 02/15/2015  . Hernia of anterior abdominal wall 02/15/2015  . Dermatophytosis of groin 02/15/2015  . Candida vaginitis 02/15/2015  . Acid reflux 11/03/2014  . Hyperlipidemia due to type 2 diabetes mellitus ([place]) 03/17/2014   . S/P TKR (total knee replacement) 07/28/2013  . Hernia, lateral ventral 11/19/2012  . Adnexal mass 11/06/2012    Past Surgical History:  Procedure Laterality Date  . ABDOMINAL HYSTERECTOMY     2009  . CHOLECYSTECTOMY  1997  . COLONOSCOPY WITH PROPOFOL N/A 01/21/2018   Procedure: COLONOSCOPY WITH PROPOFOL;  Surgeon: Jonathon Bellows, MD;  Location: [facility] ENDOSCOPY;  Service: Gastroenterology;  Laterality: N/A;  . CORONARY ANGIOPLASTY     stints  . CORONARY STENT INTERVENTION N/A 09/13/2016   Procedure: Coronary Stent Intervention;  Surgeon: Yolonda Kida, MD;  Location: [facility] CV LAB;  Service: Cardiovascular;  Laterality: N/A;  . GALLBLADDER SURGERY    . HERNIA REPAIR  0/71/2197   periumbilical with right ooph  . JOINT REPLACEMENT     left knee  . KNEE SURGERY  12/2011   total knee replacement Dr. Duayne Cal  . LEFT HEART CATH AND CORONARY ANGIOGRAPHY N/A 09/13/2016   Procedure: Left Heart Cath and Coronary Angiography;  Surgeon: Yolonda Kida, MD;  Location: [facility] LAB;  Service: Cardiovascular;  Laterality: N/A;  . LYMPH GLAND EXCISION N/A 06/04/2017   Procedure: CERVICAL LYMPH NODE BIOPSY;  Surgeon: Vickie Epley, MD;  Location: [facility] ORS;  Service: General;  Laterality: N/A;  . TUBAL LIGATION      Her family history includes Diabetes in her mother, sister, sister, and sister; Heart disease in her father.     Outpatient Encounter Medications as of 07/18/2019  Medication Sig  . aspirin 81 MG tablet Take 1 tablet by mouth daily.  Marland Kitchen atorvastatin (LIPITOR) 80 MG tablet Take 1 tablet (80 mg total) by mouth daily at 6 PM.  . Blood Glucose Monitoring Suppl (FIFTY50 GLUCOSE METER 2.0) W/DEVICE KIT Use as directed.  . clopidogrel (PLAVIX) 75 MG tablet Take 75 mg by mouth daily.  Marland Kitchen glyBURIDE (DIABETA) 2.5 MG tablet Take 1 tablet by mouth daily.  . insulin glargine (LANTUS) 100 UNIT/ML injection Inject 50 Units into the skin daily.  Marland Kitchen lisinopril (PRINIVIL,ZESTRIL) 20 MG  tablet Take 1 tablet (20 mg total) by mouth daily.  . MULTIPLE VITAMINS-MINERALS PO Take 1 tablet by mouth daily.  Marland Kitchen omeprazole (PRILOSEC) 40 MG capsule Take 1 capsule (40 mg total) by mouth daily. Please schedule office visit before any future refills  . ondansetron (ZOFRAN) 4 MG tablet Take 1 tablet (4 mg total) by mouth every 8 (eight) hours as needed for nausea or vomiting.  . ursodiol (ACTIGALL) 300 MG capsule Take 300 mg by mouth daily.   Marland Kitchen JARDIANCE 25 MG TABS tablet Take 25 mg daily by mouth.  . terconazole (TERAZOL 7) 0.4 % vaginal cream Place 1 applicator vaginally at bedtime.  . [DISCONTINUED] terconazole (TERAZOL 7) 0.4 % vaginal cream Place 1 applicator vaginally at bedtime. (Patient not taking: Reported on 07/18/2019)   No facility-administered encounter medications on file as of 07/18/2019.    Patient Care Team: Virginia Crews, MD as PCP - General (Family Medicine)      Objective:  Vitals:  Vitals:   07/18/19 1115  BP: (!) 160/91  Pulse: 80  Temp: (!) 96.9 F (36.1 C)  TempSrc: Skin  SpO2: 98%  Weight: 203 lb (92.1 kg)  Height: 5' 9"  (1.753 m)    Physical Exam Constitutional:      Appearance: Normal appearance. She is normal weight.  HENT:     Head: Normocephalic and atraumatic.     Right Ear: Tympanic membrane, ear canal and external ear normal.     Left Ear: Tympanic membrane, ear canal and external ear normal.     Nose: Nose normal.     Mouth/Throat:     Mouth: Mucous membranes are moist.     Pharynx: Oropharynx is clear.  Eyes:     Extraocular Movements: Extraocular movements intact.     Conjunctiva/sclera: Conjunctivae normal.     Pupils: Pupils are equal, round, and reactive to light.  Cardiovascular:     Rate and Rhythm: Normal rate and regular rhythm.     Pulses: Normal pulses.     Heart sounds: Normal heart sounds.  Pulmonary:     Effort: Pulmonary effort is normal.     Breath sounds: Normal breath sounds.  Abdominal:     General:  Abdomen is flat. Bowel sounds are normal.     Palpations: Abdomen is soft.  Genitourinary:    Comments: GYN:  External genitalia within normal limits.  Vaginal mucosa pink, moist, normal rugae.  Nonfriable cervix without lesions, no discharge or bleeding noted on speculum exam.   Musculoskeletal:        General: Normal range of motion.     Cervical back: Normal range of motion and neck supple.  Skin:    General: Skin is warm and dry.  Neurological:     General: No focal deficit present.     Mental Status: She is alert and oriented to person, place, and time. Mental status is at baseline.  Psychiatric:        Mood and Affect: Mood normal.        Behavior: Behavior normal.        Thought Content: Thought content normal.        Judgment: Judgment normal.      Depression Screen PHQ 2/9 Scores 07/18/2019 12/24/2017 09/20/2015  PHQ - 2 Score 1 0 0  PHQ- 9 Score 3 0 -      Assessment & Plan:     Routine Health Maintenance and Physical Exam  Exercise Activities and Dietary recommendations Goals   None     Immunization History  Administered Date(s) Administered  . Pneumococcal Polysaccharide-23 09/20/2015  . Tdap 12/08/2014    Health Maintenance  Topic Date Due  . MAMMOGRAM  02/23/2018  . PAP SMEAR-Modifier  09/20/2018  . OPHTHALMOLOGY EXAM  07/08/2019  . HEMOGLOBIN A1C  12/02/2019  . FOOT EXAM  07/17/2020  . COLONOSCOPY  01/22/2023  . TETANUS/TDAP  12/07/2024  . INFLUENZA VACCINE  Completed  . PNEUMOCOCCAL POLYSACCHARIDE VACCINE AGE 58-64 HIGH RISK  Completed  . Hepatitis C Screening  Completed  . HIV Screening  Completed     Discussed health benefits of physical activity, and encouraged her to engage in regular exercise appropriate for her age and condition.    Problem List Items Addressed This Visit      Cardiovascular and Mediastinum   BP (high blood pressure)    Uncontrolled, but reports no medication yet today Continue lisinopril and metoprolol Continue  to monitor BP Recheck CMP  Relevant Orders   Comprehensive metabolic panel   Atherosclerosis of aorta [facility])     Digestive   Primary biliary cholangitis (HCC)    Continue to follow with GI Repeat LFTs      Relevant Orders   CBC with Differential/Platelet   Comprehensive metabolic panel   DG Bone Density     Endocrine   Diabetes mellitus, type 2 ([place])    Followed by endocrinology Recent A1c uncontrolled      Hyperlipidemia due to type 2 diabetes mellitus ([place])    Continue atorvastatin Recheck CMP and FLP Goal LDL <70 in setting of DM      Relevant Orders   Lipid Panel With LDL/HDL Ratio    Other Visit Diagnoses    Encounter for annual physical exam    -  Primary   Relevant Orders   CBC with Differential/Platelet   Comprehensive metabolic panel   Lipid Panel With LDL/HDL Ratio   MM 3D SCREEN BREAST BILATERAL   POCT urinalysis dipstick (Completed)   DG Bone Density   Postmenopausal       Relevant Orders   DG Bone Density   Breast cancer screening by mammogram       Relevant Orders   MM 3D SCREEN BREAST BILATERAL   Lower abdominal pain       Relevant Orders   Urine Culture   Cervical cancer screening       Relevant Orders   Pap IG and HPV (high risk) DNA detection   Vaginal discharge       Relevant Medications   terconazole (TERAZOL 7) 0.4 % vaginal cream   Other Relevant Orders   NuSwab Vaginitis Plus (VG+)   Overweight        - will send urine for culture, but exam and UA benign - return precautions discussed   Return in about 6 months (around 01/18/2020) for chronic disease f/u.   The entirety of the information documented in the History of Present Illness, Review of Systems and Physical Exam were personally obtained by me. Portions of this information were initially documented by Lynford Humphrey, CMA and reviewed by me for thoroughness and accuracy.    Vibha Ferdig, Dionne Bucy, MD MPH [facility]

## 2019-07-18 ENCOUNTER — Ambulatory Visit (INDEPENDENT_AMBULATORY_CARE_PROVIDER_SITE_OTHER): Payer: 59 | Admitting: Family Medicine

## 2019-07-18 ENCOUNTER — Other Ambulatory Visit: Payer: Self-pay

## 2019-07-18 VITALS — BP 160/91 | HR 80 | Temp 96.9°F | Ht 69.0 in | Wt 203.0 lb

## 2019-07-18 DIAGNOSIS — E785 Hyperlipidemia, unspecified: Secondary | ICD-10-CM

## 2019-07-18 DIAGNOSIS — Z124 Encounter for screening for malignant neoplasm of cervix: Secondary | ICD-10-CM

## 2019-07-18 DIAGNOSIS — Z780 Asymptomatic menopausal state: Secondary | ICD-10-CM

## 2019-07-18 DIAGNOSIS — K743 Primary biliary cirrhosis: Secondary | ICD-10-CM | POA: Diagnosis not present

## 2019-07-18 DIAGNOSIS — Z0000 Encounter for general adult medical examination without abnormal findings: Secondary | ICD-10-CM | POA: Diagnosis not present

## 2019-07-18 DIAGNOSIS — N898 Other specified noninflammatory disorders of vagina: Secondary | ICD-10-CM

## 2019-07-18 DIAGNOSIS — I10 Essential (primary) hypertension: Secondary | ICD-10-CM

## 2019-07-18 DIAGNOSIS — E1169 Type 2 diabetes mellitus with other specified complication: Secondary | ICD-10-CM | POA: Diagnosis not present

## 2019-07-18 DIAGNOSIS — R1030 Lower abdominal pain, unspecified: Secondary | ICD-10-CM

## 2019-07-18 DIAGNOSIS — I700 Atherosclerosis of aorta: Secondary | ICD-10-CM

## 2019-07-18 DIAGNOSIS — E663 Overweight: Secondary | ICD-10-CM

## 2019-07-18 DIAGNOSIS — Z1231 Encounter for screening mammogram for malignant neoplasm of breast: Secondary | ICD-10-CM

## 2019-07-18 LAB — POCT URINALYSIS DIPSTICK
Bilirubin, UA: NEGATIVE
Blood, UA: NEGATIVE
Glucose, UA: POSITIVE — AB
Ketones, UA: NEGATIVE
Leukocytes, UA: NEGATIVE
Nitrite, UA: NEGATIVE
Protein, UA: NEGATIVE
Spec Grav, UA: 1.01 (ref 1.010–1.025)
Urobilinogen, UA: 0.2 E.U./dL
pH, UA: 6 (ref 5.0–8.0)

## 2019-07-18 MED ORDER — TERCONAZOLE 0.4 % VA CREA: 1.0000 | TOPICAL_CREAM | Freq: Every day | VAGINAL | 0 refills | Status: DC

## 2019-07-18 NOTE — Assessment & Plan Note (Signed)
Continue atorvastatin Recheck CMP and FLP Goal LDL <70 in setting of DM

## 2019-07-18 NOTE — Patient Instructions (Signed)

## 2019-07-18 NOTE — Assessment & Plan Note (Signed)
Uncontrolled, but reports no medication yet today Continue lisinopril and metoprolol Continue to monitor BP Recheck CMP

## 2019-07-18 NOTE — Assessment & Plan Note (Signed)
Continue to follow with GI Repeat LFTs

## 2019-07-18 NOTE — Assessment & Plan Note (Signed)
Followed by endocrinology Recent A1c uncontrolled

## 2019-07-21 LAB — NUSWAB VAGINITIS PLUS (VG+)
Candida albicans, NAA: NEGATIVE
Candida glabrata, NAA: NEGATIVE
Chlamydia trachomatis, NAA: NEGATIVE
Neisseria gonorrhoeae, NAA: NEGATIVE
Trich vag by NAA: NEGATIVE

## 2019-07-22 ENCOUNTER — Other Ambulatory Visit: Payer: Self-pay

## 2019-07-22 DIAGNOSIS — N3090 Cystitis, unspecified without hematuria: Secondary | ICD-10-CM

## 2019-07-22 MED ORDER — SULFAMETHOXAZOLE-TRIMETHOPRIM 800-160 MG PO TABS: 1.0000 | ORAL_TABLET | Freq: Two times a day (BID) | ORAL | 0 refills | Status: DC

## 2019-07-22 NOTE — Telephone Encounter (Signed)
Patient advised as below. Patient requesting antibiotic. Patient reports that she is having some pain in lower abdomin and reports that her urine smells like "fish." please advise.

## 2019-07-22 NOTE — Telephone Encounter (Signed)
Filled bactrim ds 1 pill twice daily for three days. Please confirm her local pharmacy of choice.

## 2019-07-22 NOTE — Telephone Encounter (Signed)
-----   Message from Virginia Crews, MD sent at 07/21/2019  2:13 PM EDT ----- Negative for yeast, BV, GC/CT.  UCx still pending

## 2019-07-23 ENCOUNTER — Telehealth: Payer: Self-pay

## 2019-07-23 LAB — IGP, APTIMA HPV, RFX 16/18,45
HPV Aptima: NEGATIVE
PAP Smear Comment: 0

## 2019-07-23 LAB — URINE CULTURE

## 2019-07-23 NOTE — Telephone Encounter (Signed)
LMTCB

## 2019-07-23 NOTE — Telephone Encounter (Signed)
-----   Message from Virginia Crews, MD sent at 07/23/2019  4:47 PM EDT ----- Normal Pap smear.  HPV negative.  Repeat in 3 to 5 years

## 2019-07-23 NOTE — Telephone Encounter (Signed)
-----   Message from Virginia Crews, MD sent at 07/23/2019 11:48 AM EDT ----- Urine culture confirms UTI from E coli.  Start Bactrim DS 1 tab BID x5 days.  Ok to eRx if patient agrees.

## 2019-07-24 NOTE — Telephone Encounter (Signed)
Patient advised of positive urine culture. Patient has started taking Bactrim. Patient reports she is feeling better.

## 2019-07-25 ENCOUNTER — Ambulatory Visit: Payer: 59 | Attending: Internal Medicine

## 2019-07-25 DIAGNOSIS — Z23 Encounter for immunization: Secondary | ICD-10-CM

## 2019-07-25 NOTE — Progress Notes (Signed)
   Covid-19 Vaccination Clinic  Name:  Lisa [name]    MRN: [id number] DOB: 1961-05-02  07/25/2019  Lisa [name] was observed post Covid-19 immunization for 15 minutes without incident. She was provided with Vaccine Information Sheet and instruction to access the V-Safe system.   Lisa [name] was instructed to call 911 with any severe reactions post vaccine: Marland Kitchen Difficulty breathing  . Swelling of face and throat  . A fast heartbeat  . A bad rash all over body  . Dizziness and weakness   Immunizations Administered    Name Date Dose VIS Date Route   Pfizer COVID-19 Vaccine 07/25/2019 11:50 AM 0.3 mL 04/18/2019 Intramuscular   Manufacturer: [place]   Lot: [id number]   [place] : [id number]

## 2019-08-02 ENCOUNTER — Other Ambulatory Visit: Payer: Self-pay | Admitting: Family Medicine

## 2019-08-02 NOTE — Telephone Encounter (Signed)
Requested Prescriptions  Pending Prescriptions Disp Refills  . omeprazole (PRILOSEC) 40 MG capsule [Pharmacy Med Name: OMEPRAZOLE  40MG   CAP] 90 capsule 3    Sig: TAKE 1 CAPSULE BY MOUTH  DAILY     Gastroenterology: Proton Pump Inhibitors Passed - 08/02/2019  9:15 PM      Passed - Valid encounter within last 12 months    Recent Outpatient Visits          2 weeks ago Encounter for annual physical exam   [facility] [place], Dionne Bucy, MD   10 months ago Vaginal candidiasis   [facility] [place], Dionne Bucy, MD   1 year ago Fever, unspecified fever cause   [facility], Dionne Bucy, MD   1 year ago Annual physical exam   [facility], Dionne Bucy, MD   1 year ago Essential hypertension   [place], Dionne Bucy, MD      Future Appointments            In 5 months [place], Dionne Bucy, MD [facility] [place]

## 2019-08-19 ENCOUNTER — Ambulatory Visit: Payer: 59

## 2019-08-20 ENCOUNTER — Ambulatory Visit: Payer: 59 | Attending: Internal Medicine

## 2019-08-20 DIAGNOSIS — Z23 Encounter for immunization: Secondary | ICD-10-CM

## 2019-08-20 NOTE — Progress Notes (Signed)
   Covid-19 Vaccination Clinic  Name:  Lisa [name]    MRN: [id number] DOB: 1960-07-04  08/20/2019  [name] was observed post Covid-19 immunization for 15 minutes without incident. She was provided with Vaccine Information Sheet and instruction to access the V-Safe system.   [name] was instructed to call 911 with any severe reactions post vaccine: Marland Kitchen Difficulty breathing  . Swelling of face and throat  . A fast heartbeat  . A bad rash all over body  . Dizziness and weakness   Immunizations Administered    Name Date Dose VIS Date Route   Pfizer COVID-19 Vaccine 08/20/2019 11:30 AM 0.3 mL 04/18/2019 Intramuscular   Manufacturer: [employer]   Lot: [id number]   [place]: [id number]

## 2019-09-01 ENCOUNTER — Other Ambulatory Visit: Payer: 59

## 2019-09-22 LAB — HM DIABETES EYE EXAM

## 2019-09-26 ENCOUNTER — Encounter: Payer: Self-pay | Admitting: Family Medicine

## 2019-10-20 ENCOUNTER — Ambulatory Visit
Admission: RE | Admit: 2019-10-20 | Discharge: 2019-10-20 | Disposition: A | Discharge status: Home / Self Care | Payer: 59 | Source: Ambulatory Visit | Attending: Family Medicine | Admitting: Family Medicine

## 2019-10-20 DIAGNOSIS — Z780 Asymptomatic menopausal state: Secondary | ICD-10-CM | POA: Insufficient documentation

## 2019-10-20 DIAGNOSIS — Z0000 Encounter for general adult medical examination without abnormal findings: Secondary | ICD-10-CM

## 2019-10-20 DIAGNOSIS — K743 Primary biliary cirrhosis: Secondary | ICD-10-CM | POA: Diagnosis present

## 2019-10-20 DIAGNOSIS — Z1231 Encounter for screening mammogram for malignant neoplasm of breast: Secondary | ICD-10-CM | POA: Diagnosis present

## 2019-10-21 ENCOUNTER — Telehealth: Payer: Self-pay

## 2019-10-21 NOTE — Telephone Encounter (Signed)
Patient advised and verbalized understanding 

## 2019-10-21 NOTE — Telephone Encounter (Signed)
-----   Message from Mar Daring, [place] sent at 10/21/2019  9:43 AM EDT ----- Bone density is normal. Will repeat at 59 yrs old.

## 2019-10-22 ENCOUNTER — Telehealth: Payer: Self-pay

## 2019-10-22 NOTE — Telephone Encounter (Signed)
-----   Message from Mar Daring, PA-C sent at 10/21/2019 10:51 AM EDT ----- Normal mammogram. Repeat screening in one year.

## 2019-10-22 NOTE — Telephone Encounter (Signed)
Patient advised as below.  

## 2020-01-19 ENCOUNTER — Ambulatory Visit: Payer: Self-pay | Admitting: Family Medicine

## 2020-07-06 ENCOUNTER — Other Ambulatory Visit: Payer: Self-pay | Admitting: Family Medicine

## 2020-07-06 NOTE — Telephone Encounter (Signed)
Requested medications are due for refill today yes  Requested medications are on the active medication list yes  Last refill 1/7 (mail order)  Last visit 07/2019  Future visit scheduled no  Notes to clinic OV note  states return in 6 months (Sept) but did not and there is no upcoming appt scheduled.

## 2020-07-22 ENCOUNTER — Ambulatory Visit: Payer: 59 | Admitting: Physician Assistant

## 2020-07-27 ENCOUNTER — Other Ambulatory Visit
Admission: RE | Admit: 2020-07-27 | Discharge: 2020-07-27 | Disposition: A | Discharge status: Home / Self Care | Payer: 59 | Source: Ambulatory Visit | Attending: Student | Admitting: Student

## 2020-07-27 ENCOUNTER — Ambulatory Visit: Payer: 59 | Admitting: Family Medicine

## 2020-07-27 DIAGNOSIS — Z01818 Encounter for other preprocedural examination: Secondary | ICD-10-CM | POA: Insufficient documentation

## 2020-07-27 LAB — BRAIN NATRIURETIC PEPTIDE: B Natriuretic Peptide: 88.9 pg/mL (ref 0.0–100.0)

## 2020-07-29 ENCOUNTER — Encounter: Payer: Self-pay | Admitting: Adult Health

## 2020-07-29 ENCOUNTER — Ambulatory Visit: Payer: 59 | Admitting: Adult Health

## 2020-07-29 ENCOUNTER — Other Ambulatory Visit: Payer: Self-pay

## 2020-07-29 VITALS — BP 148/78 | HR 74 | Temp 98.5°F | Resp 16 | Wt 216.0 lb

## 2020-07-29 DIAGNOSIS — N3090 Cystitis, unspecified without hematuria: Secondary | ICD-10-CM | POA: Diagnosis not present

## 2020-07-29 DIAGNOSIS — R300 Dysuria: Secondary | ICD-10-CM | POA: Diagnosis not present

## 2020-07-29 DIAGNOSIS — N898 Other specified noninflammatory disorders of vagina: Secondary | ICD-10-CM | POA: Diagnosis not present

## 2020-07-29 LAB — POCT URINALYSIS DIPSTICK
Blood, UA: NEGATIVE
Glucose, UA: NEGATIVE
Ketones, UA: NEGATIVE
Leukocytes, UA: NEGATIVE
Nitrite, UA: POSITIVE
Protein, UA: POSITIVE — AB
Spec Grav, UA: 1.015 (ref 1.010–1.025)
Urobilinogen, UA: 0.2 E.U./dL
pH, UA: 5 (ref 5.0–8.0)

## 2020-07-29 MED ORDER — SULFAMETHOXAZOLE-TRIMETHOPRIM 800-160 MG PO TABS: 1.0000 | ORAL_TABLET | Freq: Two times a day (BID) | ORAL | 0 refills | Status: DC

## 2020-07-29 MED ORDER — TERCONAZOLE 0.4 % VA CREA: 1.0000 | TOPICAL_CREAM | Freq: Every day | VAGINAL | 0 refills | Status: DC

## 2020-07-29 NOTE — Patient Instructions (Signed)
Sulfamethoxazole; Trimethoprim, SMX-TMP tablets What is this medicine? SULFAMETHOXAZOLE; TRIMETHOPRIM or SMX-TMP (suhl fuh meth OK suh zohl; trye METH oh prim) is a combination of a sulfonamide antibiotic and a second antibiotic, trimethoprim. It is used to treat or prevent certain kinds of bacterial infections. It will not work for colds, flu, or other viral infections. This medicine may be used for other purposes; ask your health care provider or pharmacist if you have questions. COMMON BRAND NAME(S): Bacter-Aid DS, Bactrim, Bactrim DS, Septra, Septra DS What should I tell my health care provider before I take this medicine? They need to know if you have any of these conditions:  G6PD deficiency  HIV or AIDS  kidney disease  liver disease  low platelets  low red blood cell counts  poor nutrition  stomach or intestine problems like colitis  thyroid disease  an unusual or allergic reaction to sulfamethoxazole, trimethoprim, sulfa drugs, other drugs, foods, dyes, or preservatives  pregnant or trying to get pregnant  breast-feeding How should I use this medicine? Take this medicine by mouth with a glass of water. Follow the directions on the prescription label. Take your medicine at regular intervals. Do not take it more often than directed. Take all of your medicine as directed even if you think you are better. Do not skip doses or stop your medicine early. Talk to your pediatrician regarding the use of this medicine in children. While this drug may be prescribed for children as young as 2 months for selected conditions, precautions do apply. Overdosage: If you think you have taken too much of this medicine contact a poison control center or emergency room at once. NOTE: This medicine is only for you. Do not share this medicine with others. What if I miss a dose? If you miss a dose, take it as soon as you can. If it is almost time for your next dose, take only that dose. Do not  take double or extra doses. What may interact with this medicine? Do not take this medicine with any of the following medications:  dofetilide This medicine may also interact with the following medications:  amantadine  birth control pills  certain medicines for blood pressure, heart disease  certain medicines for depression, like amitriptyline  certain medicines for diabetes, like glipizide or glyburide  certain medicines that treat or prevent blood clots like warfarin  cyclosporine  digoxin  diuretics  indomethacin  methotrexate  phenytoin  procainamide  pyrimethamine  zidovudine This list may not describe all possible interactions. Give your health care provider a list of all the medicines, herbs, non-prescription drugs, or dietary supplements you use. Also tell them if you smoke, drink alcohol, or use illegal drugs. Some items may interact with your medicine. What should I watch for while using this medicine? Tell your health care provider if your symptoms do not start to get better or if they get worse. Do not treat diarrhea with over the counter products. Contact your health care provider if you have diarrhea that lasts more than 2 days or if it is severe and watery. This drug may cause serious skin reactions. They can happen weeks to months after starting the drug. Contact your health care provider right away if you notice fevers or flu-like symptoms with a rash. The rash may be red or purple and then turn into blisters or peeling of the skin. Or, you might notice a red rash with swelling of the face, lips or lymph nodes in your neck  or under your arms. This drug can make you more sensitive to the sun. Keep out of the sun. If you cannot avoid being in the sun, wear protective clothing and sunscreen. Do not use sun lamps or tanning beds/booths. Be careful brushing or flossing your teeth or using a toothpick because you may get an infection or bleed more easily. If you  have any dental work done, tell your dentist you are receiving this drug. What side effects may I notice from receiving this medicine? Side effects that you should report to your doctor or health care professional as soon as possible:  allergic reactions (skin rash, itching or hives; swelling of the face, lips, or tongue)  bloody or watery diarrhea  cough  heartbeat rhythm changes (trouble breathing; chest pain; dizziness; fast, irregular heartbeat; feeling faint or lightheaded, falls,)  fever  high potassium levels (chest pain; fast, irregular heartbeat; muscle weakness)  kidney injury (trouble passing urine or change in the amount of urine)  liver injury (dark yellow or brown urine; general ill feeling or flu-like symptoms; loss of appetite, right upper belly pain; unusually weak or tired, yellowing of the eyes or skin)  low blood pressure (dizziness; feeling faint or lightheaded, falls; unusually weak or tired)  low blood sugar (feeling anxious; confusion; dizziness; increased hunger; unusually weak or tired; increased sweating; shakiness; cold, clammy skin; irritable; headache; blurred vision; fast heartbeat; loss of consciousness)  low red blood cell counts (trouble breathing; feeling faint; lightheaded, falls; unusually weak or tired)  rash, fever, and swollen lymph nodes  redness, blistering, peeling, or loosening of the skin, including inside the mouth  trouble breathing  unusual bruising or bleeding Side effects that usually do not require medical attention (report to your doctor or health care professional if they continue or are bothersome):  lack or loss of appetite  nausea  vomiting This list may not describe all possible side effects. Call your doctor for medical advice about side effects. You may report side effects to FDA at 1-800-FDA-1088. Where should I keep my medicine? Keep out of the reach of children. Store between 15 and 25 degrees C (59 to 77 degrees  F). Protect from light. Keep the container tightly closed. Throw away any unused drug after the expiration date. NOTE: This sheet is a summary. It may not cover all possible information. If you have questions about this medicine, talk to your doctor, pharmacist, or health care provider.  2021 Elsevier/Gold Standard (2019-03-28 09:54:22) Terconazole vaginal cream What is this medicine? TERCONAZOLE (ter KON a zole) is an antifungal medicine. It is used to treat yeast infections of the vagina. This medicine may be used for other purposes; ask your health care provider or pharmacist if you have questions. COMMON BRAND NAME(S): Terazol 3, Terazol 7, Zazole What should I tell my health care provider before I take this medicine? They need to know if you have any of these conditions:  an unusual or allergic reaction to terconazole, other antifungals, other medicines, foods, dyes or preservatives  pregnant or trying to get pregnant  breast-feeding How should I use this medicine? This medicine is only for use in the vagina. Do not take by mouth. Wash hands before and after use. Read package directions carefully before using. Use this medicine at bedtime, unless otherwise directed by your doctor or health care professional. Screw the applicator onto the end of the tube and squeeze the tube to fill the applicator. Remove the applicator from the tube. Lie on your  back. Gently insert the applicator tip high in the vagina and push the plunger to release the cream into the vagina. Gently remove the applicator. Wash the applicator well with warm water and soap. Use at regular intervals. Do not get this medicine in your eyes. If you do, rinse out with plenty of cool tap water. Finish the full course prescribed by your doctor or health care professional even if you think your condition is better. Do not stop using this medicine if your menstrual period starts during the time of treatment. Talk to your pediatrician  regarding the use of this medicine in children. Special care may be needed. Overdosage: If you think you have taken too much of this medicine contact a poison control center or emergency room at once. NOTE: This medicine is only for you. Do not share this medicine with others. What if I miss a dose? If you miss a dose, use it as soon as you can. If it is almost time for your next dose, use only that dose. Do not use double or extra doses. What may interact with this medicine? Interactions are not expected. Do not use any other vaginal products without telling your doctor or health care professional. This list may not describe all possible interactions. Give your health care provider a list of all the medicines, herbs, non-prescription drugs, or dietary supplements you use. Also tell them if you smoke, drink alcohol, or use illegal drugs. Some items may interact with your medicine. What should I watch for while using this medicine? Tell your doctor or health care professional if your symptoms do not start to get better within a few days. It is better not to have sex until you have finished your treatment. If you have sex, your partner should use a condom during sex to help prevent transfer of the infection. Your sexual partner may also need treatment. Vaginal medicines usually will come out of the vagina during treatment. To keep the medicine from getting on your clothing, wear a mini-pad or sanitary napkin. The use of tampons is not recommended since they may soak up the medicine. To help clear up the infection, wear freshly washed cotton, not synthetic, underwear. What side effects may I notice from receiving this medicine? Side effects that you should report to your doctor or health care professional as soon as possible:  painful or difficult urination  vaginal pain Side effects that usually do not require medical attention (report to your doctor or health care professional if they continue or  are bothersome):  headache  menstrual pain  stomach upset  vaginal irritation, itching or burning This list may not describe all possible side effects. Call your doctor for medical advice about side effects. You may report side effects to FDA at 1-800-FDA-1088. Where should I keep my medicine? Keep out of the reach of children. Store at room temperature between 15 and 30 degrees C (59 and 86 degrees F). Throw away any unused medicine after the expiration date. NOTE: This sheet is a summary. It may not cover all possible information. If you have questions about this medicine, talk to your doctor, pharmacist, or health care provider.  2021 Elsevier/Gold Standard (2008-01-08 13:51:27) Urinary Tract Infection, Adult A urinary tract infection (UTI) is an infection of any part of the urinary tract. The urinary tract includes:  The kidneys.  The ureters.  The bladder.  The urethra. These organs make, store, and get rid of pee (urine) in the body. What are the  causes? This infection is caused by germs (bacteria) in your genital area. These germs grow and cause swelling (inflammation) of your urinary tract. What increases the risk? The following factors may make you more likely to develop this condition:  Using a small, thin tube (catheter) to drain pee.  Not being able to control when you pee or poop (incontinence).  Being female. If you are female, these things can increase the risk: ? Using these methods to prevent pregnancy:  A medicine that kills sperm (spermicide).  A device that blocks sperm (diaphragm). ? Having low levels of a female hormone (estrogen). ? Being pregnant. You are more likely to develop this condition if:  You have genes that add to your risk.  You are sexually active.  You take antibiotic medicines.  You have trouble peeing because of: ? A prostate that is bigger than normal, if you are female. ? A blockage in the part of your body that drains pee  from the bladder. ? A kidney stone. ? A nerve condition that affects your bladder. ? Not getting enough to drink. ? Not peeing often enough.  You have other conditions, such as: ? Diabetes. ? A weak disease-fighting system (immune system). ? Sickle cell disease. ? Gout. ? Injury of the spine. What are the signs or symptoms? Symptoms of this condition include:  Needing to pee right away.  Peeing small amounts often.  Pain or burning when peeing.  Blood in the pee.  Pee that smells bad or not like normal.  Trouble peeing.  Pee that is cloudy.  Fluid coming from the vagina, if you are female.  Pain in the belly or lower back. Other symptoms include:  Vomiting.  Not feeling hungry.  Feeling mixed up (confused). This may be the first symptom in older adults.  Being tired and grouchy (irritable).  A fever.  Watery poop (diarrhea). How is this treated?  Taking antibiotic medicine.  Taking other medicines.  Drinking enough water. In some cases, you may need to see a specialist. Follow these instructions at home: Medicines  Take over-the-counter and prescription medicines only as told by your doctor.  If you were prescribed an antibiotic medicine, take it as told by your doctor. Do not stop taking it even if you start to feel better. General instructions  Make sure you: ? Pee until your bladder is empty. ? Do not hold pee for a long time. ? Empty your bladder after sex. ? Wipe from front to back after peeing or pooping if you are a female. Use each tissue one time when you wipe.  Drink enough fluid to keep your pee pale yellow.  Keep all follow-up visits.   Contact a doctor if:  You do not get better after 1-2 days.  Your symptoms go away and then come back. Get help right away if:  You have very bad back pain.  You have very bad pain in your lower belly.  You have a fever.  You have chills.  You feeling like you will vomit or you  vomit. Summary  A urinary tract infection (UTI) is an infection of any part of the urinary tract.  This condition is caused by germs in your genital area.  There are many risk factors for a UTI.  Treatment includes antibiotic medicines.  Drink enough fluid to keep your pee pale yellow. This information is not intended to replace advice given to you by your health care provider. Make sure you discuss any  questions you have with your health care provider. Document Revised: 12/05/2019 Document Reviewed: 12/05/2019 Elsevier Patient Education  [place].

## 2020-07-29 NOTE — Progress Notes (Signed)
Established patient visit   Patient: Lisa [name]   DOB: [birth year]   60 y.o. Female  MRN: [id number] Visit Date: 07/29/2020  Today's healthcare provider: Marcille Buffy, FNP   Chief Complaint  Patient presents with  . Dysuria   Subjective    Dysuria  This is a new problem. The current episode started 1 to 4 weeks ago. The quality of the pain is described as aching and burning. There has been no fever. She is not sexually active. Pertinent negatives include no chills, discharge, flank pain, frequency, hematuria, hesitancy, nausea, possible pregnancy, sweats, urgency or vomiting. Associated symptoms comments: Vaginal itching. She has tried nothing for the symptoms. Her past medical history is significant for recurrent UTIs.    Patient  denies any fever, body aches,chills, rash, chest pain, shortness of breath, nausea, vomiting, or diarrhea.  Denies dizziness, lightheadedness, pre syncopal or syncopal episodes.    Past Medical History:  Diagnosis Date  . Allergic rhinitis 02/15/2015  . Arthritis   . BP (high blood pressure) 02/15/2015  . Cervical lymphadenopathy   . Cirrhosis ([place])   . Diabetes mellitus   . Elevated liver enzymes 09/23/2015  . Elevated liver enzymes 09/23/2015  . GERD (gastroesophageal reflux disease)   . Gonalgia 02/15/2015  . Hernia, lateral ventral 11/19/2012  . Hypertension   . Intra-abdominal and pelvic swelling, mass and lump, unspecified site 11/19/2012  . Lymphadenopathy 03/18/2017   Past Surgical History:  Procedure Laterality Date  . ABDOMINAL HYSTERECTOMY     2009  . BREAST BIOPSY    . CHOLECYSTECTOMY  1997  . COLONOSCOPY WITH PROPOFOL N/A 01/21/2018   Procedure: COLONOSCOPY WITH PROPOFOL;  Surgeon: Jonathon Bellows, MD;  Location: [facility] ENDOSCOPY;  Service: Gastroenterology;  Laterality: N/A;  . CORONARY ANGIOPLASTY     stints  . CORONARY STENT INTERVENTION N/A 09/13/2016   Procedure: Coronary Stent Intervention;  Surgeon:  Yolonda Kida, MD;  Location: [place] CV LAB;  Service: Cardiovascular;  Laterality: N/A;  . GALLBLADDER SURGERY    . HERNIA REPAIR  9/32/6712   periumbilical with right ooph  . JOINT REPLACEMENT     left knee  . KNEE SURGERY  12/2011   total knee replacement Dr. Duayne Cal  . LEFT HEART CATH AND CORONARY ANGIOGRAPHY N/A 09/13/2016   Procedure: Left Heart Cath and Coronary Angiography;  Surgeon: Yolonda Kida, MD;  Location: [place] [facility];  Service: Cardiovascular;  Laterality: N/A;  . LYMPH GLAND EXCISION N/A 06/04/2017   Procedure: CERVICAL LYMPH NODE BIOPSY;  Surgeon: Vickie Epley, MD;  Location: [facility] ORS;  Service: General;  Laterality: N/A;  . TUBAL LIGATION     Social History   Tobacco Use  . Smoking status: Never Smoker  . Smokeless tobacco: Never Used  Vaping Use  . Vaping Use: Never used  Substance Use Topics  . Alcohol use: No  . Drug use: No   Social History   Socioeconomic History  . Marital status: Married    Spouse name: Not on file  . Number of children: 1  . Years of education: Not on file  . Highest education level: Not on file  Occupational History  . Not on file  Tobacco Use  . Smoking status: Never Smoker  . Smokeless tobacco: Never Used  Vaping Use  . Vaping Use: Never used  Substance and Sexual Activity  . Alcohol use: No  . Drug use: No  . Sexual activity: Not on file  Other Topics Concern  . Not on file  Social History Narrative  . Not on file   Social Determinants of Health   Financial Resource Strain: Not on file  Food Insecurity: Not on file  Transportation Needs: Not on file  Physical Activity: Not on file  Stress: Not on file  Social Connections: Not on file  Intimate Partner Violence: Not on file   Family Status  Relation Name Status  . Sister  Alive  . Brother  Alive  . Sister  Alive  . Sister  Alive  . Sister  Alive  . Brother  Alive  . Mother  Deceased  . Father  Deceased   Family History   Problem Relation Age of Onset  . Diabetes Sister   . Diabetes Sister   . Diabetes Sister   . Diabetes Mother   . Heart disease Father    Allergies  Allergen Reactions  . Amoxicillin-Pot Clavulanate Swelling    lips  . Ciprofloxacin Swelling    lips  . Clarithromycin Swelling    lips       Medications: Outpatient Medications Prior to Visit  Medication Sig  . aspirin 81 MG tablet Take 1 tablet by mouth daily.  Marland Kitchen atorvastatin (LIPITOR) 80 MG tablet Take 1 tablet (80 mg total) by mouth daily at 6 PM.  . Blood Glucose Monitoring Suppl (FIFTY50 GLUCOSE METER 2.0) W/DEVICE KIT Use as directed.  . clopidogrel (PLAVIX) 75 MG tablet Take 75 mg by mouth daily.  Marland Kitchen glyBURIDE (DIABETA) 2.5 MG tablet Take 1 tablet by mouth daily.  . insulin glargine (LANTUS) 100 UNIT/ML injection Inject 50 Units into the skin daily.  Marland Kitchen JARDIANCE 25 MG TABS tablet Take 25 mg daily by mouth.  Marland Kitchen lisinopril (ZESTRIL) 5 MG tablet Take 5 mg by mouth daily.  . metoprolol succinate (TOPROL-XL) 25 MG 24 hr tablet Take 25 mg by mouth daily.  . MULTIPLE VITAMINS-MINERALS PO Take 1 tablet by mouth daily.  Marland Kitchen omeprazole (PRILOSEC) 40 MG capsule TAKE 1 CAPSULE BY MOUTH  DAILY  . ursodiol (ACTIGALL) 300 MG capsule Take 300 mg by mouth daily.   . ondansetron (ZOFRAN) 4 MG tablet Take 1 tablet (4 mg total) by mouth every 8 (eight) hours as needed for nausea or vomiting. (Patient not taking: Reported on 07/29/2020)  . [DISCONTINUED] lisinopril (PRINIVIL,ZESTRIL) 20 MG tablet Take 1 tablet (20 mg total) by mouth daily.  . [DISCONTINUED] sulfamethoxazole-trimethoprim (BACTRIM DS) 800-160 MG tablet Take 1 tablet by mouth 2 (two) times daily. (Patient not taking: Reported on 07/29/2020)  . [DISCONTINUED] terconazole (TERAZOL 7) 0.4 % vaginal cream Place 1 applicator vaginally at bedtime. (Patient not taking: Reported on 07/29/2020)   No facility-administered medications prior to visit.    Review of Systems  Constitutional:  Negative for chills.  Gastrointestinal: Negative for nausea and vomiting.  Genitourinary: Positive for dysuria. Negative for flank pain, frequency, hematuria, hesitancy and urgency.    Last CBC Lab Results  Component Value Date   WBC 6.5 01/20/2019   HGB 12.0 01/20/2019   HCT 40 01/20/2019   MCV 78.5 (L) 05/23/2017   MCH 24.8 (L) 05/23/2017   RDW 16.0 (H) 05/23/2017   PLT 123 (A) 63/14/9702   Last metabolic panel Lab Results  Component Value Date   GLUCOSE 293 (H) 12/26/2017   NA 137 11/05/2018   K 4.0 11/05/2018   CL 95 (A) 11/05/2018   CO2 26 (A) 11/05/2018   BUN 16 11/05/2018   CREATININE 0.7 11/05/2018  GFRNONAA 96 11/05/2018   GFRAA 111 11/05/2018   CALCIUM 9.7 11/05/2018   PROT 9.3 (H) 05/31/2017   ALBUMIN 4.0 11/05/2018   LABGLOB 4.1 09/22/2015   AGRATIO 1.0 (L) 09/22/2015   BILITOT 1.0 05/31/2017   ALKPHOS 843 (A) 11/05/2018   AST 99 (H) 05/31/2017   ALT 93 (H) 05/31/2017   ANIONGAP 7 05/31/2017       Objective    BP (!) 148/78   Pulse 74   Temp 98.5 F (36.9 C) (Oral)   Resp 16   Wt 216 lb (98 kg)   SpO2 100%   BMI 31.90 kg/m  BP Readings from Last 3 Encounters:  07/29/20 (!) 148/78  07/18/19 (!) 160/91  03/13/18 130/81   Wt Readings from Last 3 Encounters:  07/29/20 216 lb (98 kg)  07/18/19 203 lb (92.1 kg)  03/13/18 201 lb 9.6 oz (91.4 kg)       Physical Exam Vitals reviewed.  Constitutional:      Appearance: Normal appearance.  Cardiovascular:     Rate and Rhythm: Normal rate and regular rhythm.     Pulses: Normal pulses.  Pulmonary:     Breath sounds: Normal breath sounds.  Abdominal:     General: There is no distension.     Palpations: Abdomen is soft. There is no mass.     Tenderness: There is no abdominal tenderness. There is no right CVA tenderness, left CVA tenderness, guarding or rebound.     Hernia: No hernia is present.  Psychiatric:        Mood and Affect: Mood normal.        Behavior: Behavior normal.         Thought Content: Thought content normal.        Judgment: Judgment normal.    No CVA tenderness bilaterally.   Results for orders placed or performed in visit on 07/29/20  POCT urinalysis dipstick  Result Value Ref Range   Color, UA orange    Clarity, UA clear    Glucose, UA Negative Negative   Bilirubin, UA small    Ketones, UA negative    Spec Grav, UA 1.015 1.010 - 1.025   Blood, UA negative    pH, UA 5.0 5.0 - 8.0   Protein, UA Positive (A) Negative   Urobilinogen, UA 0.2 0.2 or 1.0 E.U./dL   Nitrite, UA positive    Leukocytes, UA Negative Negative   Appearance     Odor      Assessment & Plan     Dysuria - Plan: POCT urinalysis dipstick, Urine Culture, CBC with Differential/Platelet, Comprehensive Metabolic Panel (CMET)  Vaginal discharge - Plan: terconazole (TERAZOL 7) 0.4 % vaginal cream  Cystitis - Plan: sulfamethoxazole-trimethoprim (BACTRIM DS) 800-160 MG tablet   Orders Placed This Encounter  Procedures  . Urine Culture  . CBC with Differential/Platelet  . Comprehensive Metabolic Panel (CMET)  . POCT urinalysis dipstick   Meds ordered this encounter  Medications  . terconazole (TERAZOL 7) 0.4 % vaginal cream    Sig: Place 1 applicator vaginally at bedtime.    Dispense:  45 g    Refill:  0  . sulfamethoxazole-trimethoprim (BACTRIM DS) 800-160 MG tablet    Sig: Take 1 tablet by mouth 2 (two) times daily.    Dispense:  6 tablet    Refill:  0   Red Flags discussed. The patient was given clear instructions to go to ER or return to medical center if any red flags develop,  symptoms do not improve, worsen or new problems develop. They verbalized understanding.  Schedule CPE with Dr. B when due schedule follow up.  Return in about 1 month (around 08/29/2020), or if symptoms worsen or fail to improve, for at any time for any worsening symptoms, Go to Emergency room/ urgent care if worse.      The entirety of the information documented in the History of Present  Illness, Review of Systems and Physical Exam were personally obtained by me. Portions of this information were initially documented by the CMA and reviewed by me for thoroughness and accuracy.      Marcille Buffy, [place] [phone] (phone) [phone] (fax)  [place]

## 2020-08-03 LAB — URINE CULTURE

## 2020-08-03 NOTE — Progress Notes (Signed)
E coli in urine. Susceptible to Bactrim she is on.  She should complete antibiotic and return for recheck if any symptoms occur,worsen or persist at anytime.

## 2020-08-04 ENCOUNTER — Telehealth: Payer: Self-pay

## 2020-08-04 NOTE — Telephone Encounter (Signed)
-----   Message from Doreen Beam, [place] sent at 08/03/2020  1:43 PM EDT -----  E coli in urine. Susceptible to Bactrim she is on.  She should complete antibiotic and return for recheck if any symptoms occur,worsen or persist at anytime.

## 2020-08-04 NOTE — Telephone Encounter (Signed)
Copied from [place] [id number]. Topic: Quick Communication - Lab Results (Clinic Use ONLY) >> Aug 04, 2020 11:33 AM Sylvester Harder, CMA wrote: I called pt and pt did not answer. VM left for pt to return call. [facility] may release results.

## 2020-08-09 ENCOUNTER — Other Ambulatory Visit
Admission: RE | Admit: 2020-08-09 | Discharge: 2020-08-09 | Disposition: A | Discharge status: Home / Self Care | Payer: 59 | Source: Ambulatory Visit | Attending: Internal Medicine | Admitting: Internal Medicine

## 2020-08-09 ENCOUNTER — Other Ambulatory Visit: Payer: Self-pay

## 2020-08-09 DIAGNOSIS — Z01812 Encounter for preprocedural laboratory examination: Secondary | ICD-10-CM | POA: Insufficient documentation

## 2020-08-09 DIAGNOSIS — Z20822 Contact with and (suspected) exposure to covid-19: Secondary | ICD-10-CM | POA: Diagnosis not present

## 2020-08-09 LAB — SARS CORONAVIRUS 2 (TAT 6-24 HRS): SARS Coronavirus 2: NEGATIVE

## 2020-08-11 ENCOUNTER — Other Ambulatory Visit: Payer: Self-pay

## 2020-08-11 ENCOUNTER — Ambulatory Visit
Admission: RE | Admit: 2020-08-11 | Discharge: 2020-08-11 | Disposition: A | Discharge status: Home / Self Care | Payer: 59 | Attending: Internal Medicine | Admitting: Internal Medicine

## 2020-08-11 ENCOUNTER — Encounter
Admission: RE | Disposition: A | Discharge status: Home / Self Care | Payer: Self-pay | Source: Home / Self Care | Attending: Internal Medicine

## 2020-08-11 ENCOUNTER — Encounter: Payer: Self-pay | Admitting: Internal Medicine

## 2020-08-11 DIAGNOSIS — I2582 Chronic total occlusion of coronary artery: Secondary | ICD-10-CM | POA: Insufficient documentation

## 2020-08-11 DIAGNOSIS — I25110 Atherosclerotic heart disease of native coronary artery with unstable angina pectoris: Secondary | ICD-10-CM | POA: Insufficient documentation

## 2020-08-11 DIAGNOSIS — R9439 Abnormal result of other cardiovascular function study: Secondary | ICD-10-CM | POA: Diagnosis present

## 2020-08-11 HISTORY — PX: LEFT HEART CATH AND CORONARY ANGIOGRAPHY: CATH118249

## 2020-08-11 LAB — GLUCOSE, CAPILLARY
Glucose-Capillary: 173 mg/dL — ABNORMAL HIGH (ref 70–99)
Glucose-Capillary: 113 mg/dL — ABNORMAL HIGH (ref 70–99)
Glucose-Capillary: 133 mg/dL — ABNORMAL HIGH (ref 70–99)

## 2020-08-11 SURGERY — LEFT HEART CATH AND CORONARY ANGIOGRAPHY
Anesthesia: Moderate Sedation | Laterality: Left

## 2020-08-11 MED ORDER — LABETALOL HCL 5 MG/ML IV SOLN
INTRAVENOUS | Status: AC
  Filled 2020-08-11: qty 4

## 2020-08-11 MED ORDER — ASPIRIN 81 MG PO CHEW
CHEWABLE_TABLET | ORAL | Status: AC
  Administered 2020-08-11: 81 mg via ORAL
  Filled 2020-08-11: qty 1

## 2020-08-11 MED ORDER — LABETALOL HCL 5 MG/ML IV SOLN
10.0000 mg | INTRAVENOUS | Status: DC | PRN
  Administered 2020-08-11 (×2): 10 mg via INTRAVENOUS

## 2020-08-11 MED ORDER — SODIUM CHLORIDE 0.9 % WEIGHT BASED INFUSION: 1.0000 mL/kg/h | INTRAVENOUS | Status: DC

## 2020-08-11 MED ORDER — SODIUM CHLORIDE 0.9 % IV BOLUS
1000.0000 mL | Freq: Once | INTRAVENOUS | Status: AC
  Administered 2020-08-11: 1000 mL via INTRAVENOUS

## 2020-08-11 MED ORDER — LIDOCAINE HCL (PF) 1 % IJ SOLN
INTRAMUSCULAR | Status: AC
  Filled 2020-08-11: qty 30

## 2020-08-11 MED ORDER — ONDANSETRON HCL 4 MG/2ML IJ SOLN: 4.0000 mg | Freq: Four times a day (QID) | INTRAMUSCULAR | Status: DC | PRN

## 2020-08-11 MED ORDER — FENTANYL CITRATE (PF) 100 MCG/2ML IJ SOLN
INTRAMUSCULAR | Status: AC
  Filled 2020-08-11: qty 2

## 2020-08-11 MED ORDER — HYDRALAZINE HCL 20 MG/ML IJ SOLN: 25.0000 mg | Freq: Once | INTRAMUSCULAR | Status: AC

## 2020-08-11 MED ORDER — SODIUM CHLORIDE 0.9 % IV SOLN: 250.0000 mL | INTRAVENOUS | Status: DC | PRN

## 2020-08-11 MED ORDER — LORAZEPAM 2 MG/ML IJ SOLN
INTRAMUSCULAR | Status: AC
  Filled 2020-08-11: qty 1

## 2020-08-11 MED ORDER — SODIUM CHLORIDE 0.9% FLUSH: 3.0000 mL | Freq: Two times a day (BID) | INTRAVENOUS | Status: DC

## 2020-08-11 MED ORDER — HEPARIN SODIUM (PORCINE) 1000 UNIT/ML IJ SOLN
INTRAMUSCULAR | Status: AC
  Filled 2020-08-11: qty 1

## 2020-08-11 MED ORDER — HEPARIN (PORCINE) IN NACL 1000-0.9 UT/500ML-% IV SOLN
INTRAVENOUS | Status: DC | PRN
  Administered 2020-08-11 (×2): 500 mL

## 2020-08-11 MED ORDER — HYDRALAZINE HCL 20 MG/ML IJ SOLN
INTRAMUSCULAR | Status: AC
  Administered 2020-08-11: 25 mg via INTRAVENOUS
  Filled 2020-08-11: qty 1

## 2020-08-11 MED ORDER — VERAPAMIL HCL 2.5 MG/ML IV SOLN
INTRAVENOUS | Status: AC
  Filled 2020-08-11: qty 2

## 2020-08-11 MED ORDER — SODIUM CHLORIDE 0.9% FLUSH
3.0000 mL | INTRAVENOUS | Status: DC | PRN
Start: 2020-08-12 — End: 2020-08-11

## 2020-08-11 MED ORDER — LORAZEPAM 2 MG/ML IJ SOLN
1.0000 mg | Freq: Once | INTRAMUSCULAR | Status: AC
  Administered 2020-08-11: 1 mg via INTRAVENOUS

## 2020-08-11 MED ORDER — HYDRALAZINE HCL 20 MG/ML IJ SOLN
INTRAMUSCULAR | Status: AC
  Filled 2020-08-11: qty 1

## 2020-08-11 MED ORDER — FENTANYL CITRATE (PF) 100 MCG/2ML IJ SOLN
INTRAMUSCULAR | Status: DC | PRN
  Administered 2020-08-11: 25 ug via INTRAVENOUS

## 2020-08-11 MED ORDER — SODIUM CHLORIDE 0.9 % WEIGHT BASED INFUSION
3.0000 mL/kg/h | INTRAVENOUS | Status: AC
  Administered 2020-08-11: 3 mL/kg/h via INTRAVENOUS

## 2020-08-11 MED ORDER — ASPIRIN 81 MG PO CHEW: 81.0000 mg | CHEWABLE_TABLET | ORAL | Status: AC

## 2020-08-11 MED ORDER — ACETAMINOPHEN 325 MG PO TABS
ORAL_TABLET | ORAL | Status: AC
  Filled 2020-08-11: qty 2

## 2020-08-11 MED ORDER — SODIUM CHLORIDE 0.9% FLUSH: 3.0000 mL | INTRAVENOUS | Status: DC | PRN

## 2020-08-11 MED ORDER — ASPIRIN 81 MG PO CHEW: 81.0000 mg | CHEWABLE_TABLET | Freq: Every day | ORAL | Status: DC

## 2020-08-11 MED ORDER — HEPARIN SODIUM (PORCINE) 1000 UNIT/ML IJ SOLN
INTRAMUSCULAR | Status: DC | PRN
  Administered 2020-08-11: 5000 [IU] via INTRAVENOUS

## 2020-08-11 MED ORDER — ACETAMINOPHEN 325 MG PO TABS
650.0000 mg | ORAL_TABLET | ORAL | Status: DC | PRN
  Administered 2020-08-11: 650 mg via ORAL

## 2020-08-11 MED ORDER — MIDAZOLAM HCL 2 MG/2ML IJ SOLN
INTRAMUSCULAR | Status: AC
  Filled 2020-08-11: qty 2

## 2020-08-11 MED ORDER — MIDAZOLAM HCL 2 MG/2ML IJ SOLN
INTRAMUSCULAR | Status: DC | PRN
Start: 2020-08-11 — End: 2020-08-11
  Administered 2020-08-11: 1 mg via INTRAVENOUS

## 2020-08-11 MED ORDER — HYDRALAZINE HCL 20 MG/ML IJ SOLN: 10.0000 mg | INTRAMUSCULAR | Status: DC | PRN

## 2020-08-11 MED ORDER — HEPARIN (PORCINE) IN NACL 1000-0.9 UT/500ML-% IV SOLN
INTRAVENOUS | Status: AC
  Filled 2020-08-11: qty 1000

## 2020-08-11 MED ORDER — IOHEXOL 300 MG/ML  SOLN
INTRAMUSCULAR | Status: DC | PRN
Start: 2020-08-11 — End: 2020-08-11
  Administered 2020-08-11: 180 mL

## 2020-08-11 MED ORDER — VERAPAMIL HCL 2.5 MG/ML IV SOLN
INTRAVENOUS | Status: DC | PRN
  Administered 2020-08-11 (×2): 2.5 mg via INTRA_ARTERIAL

## 2020-08-11 MED ORDER — LIDOCAINE HCL (PF) 1 % IJ SOLN
INTRAMUSCULAR | Status: DC | PRN
  Administered 2020-08-11: 2 mL

## 2020-08-11 SURGICAL SUPPLY — 18 items
CATH 5F 110X4 TIG (CATHETERS) ×2 IMPLANT
CATH INFINITI 5 FR JL3.5 (CATHETERS) ×2 IMPLANT
CATH INFINITI 5 FR MPA2 (CATHETERS) ×2 IMPLANT
CATH INFINITI 5FR JK (CATHETERS) ×2 IMPLANT
CATH INFINITI 5FR JL4 (CATHETERS) ×2 IMPLANT
CATH INFINITI JR4 5F (CATHETERS) ×2 IMPLANT
CATH LAUNCHER 5F EBU3.5 (CATHETERS) ×2 IMPLANT
COVER EZ STRL 42X30 (DRAPES) ×2 IMPLANT
DEVICE RAD TR BAND REGULAR (VASCULAR PRODUCTS) ×2 IMPLANT
DRAPE BRACHIAL (DRAPES) ×2 IMPLANT
GLIDESHEATH SLEND SS 6F .021 (SHEATH) ×2 IMPLANT
GUIDEWIRE INQWIRE 1.5J.035X260 (WIRE) ×1 IMPLANT
INQWIRE 1.5J .035X260CM (WIRE) ×2
PACK CARDIAC CATH (CUSTOM PROCEDURE TRAY) ×2 IMPLANT
PANNUS RETENTION SYSTEM 2 PAD (MISCELLANEOUS) ×2 IMPLANT
PROTECTION STATION PRESSURIZED (MISCELLANEOUS) ×2
SET ATX SIMPLICITY (MISCELLANEOUS) ×2 IMPLANT
STATION PROTECTION PRESSURIZED (MISCELLANEOUS) ×1 IMPLANT

## 2020-08-11 NOTE — Progress Notes (Addendum)
Patient c/o headache at 1500, post procedure with intact neuro assessment.  Per orders, treated for elevated BP with Lobetalol x 2 with limited changes in BP, but with decreased headache on pain scale.  Tolerated full diet post procedure. Progressing toward discharge home at 1630 and patient sitting up at bedside, dressing, and stated needed to vomit and using left hand she quickly stimulated back of throat and had large emesis.  Asked patient if she does this at home and she nodded and stated "yes".  She then stated she was "too weak" to get up and finish dressing.  BP elevated. Contacted Dr. Clayborn Bigness and new orders given and completed. BP responded to Hydralazine.  Dr. Clayborn Bigness at bedside for assessment and patient ok for discharge home.

## 2020-08-13 ENCOUNTER — Encounter: Payer: Self-pay | Admitting: Internal Medicine

## 2020-08-26 DIAGNOSIS — Z951 Presence of aortocoronary bypass graft: Secondary | ICD-10-CM | POA: Insufficient documentation

## 2020-09-07 ENCOUNTER — Inpatient Hospital Stay: Payer: 59 | Admitting: Family Medicine

## 2020-10-01 ENCOUNTER — Other Ambulatory Visit: Payer: Self-pay

## 2020-10-01 ENCOUNTER — Encounter: Payer: 59 | Attending: Internal Medicine | Admitting: *Deleted

## 2020-10-01 DIAGNOSIS — Z951 Presence of aortocoronary bypass graft: Secondary | ICD-10-CM

## 2020-10-01 NOTE — Progress Notes (Signed)
Initial telephone orientation completed. Diagnosis can be found in [facility] 4/19. EP orientation scheduled for Wednesday 6/1 at 9:

## 2020-10-06 ENCOUNTER — Other Ambulatory Visit: Payer: Self-pay

## 2020-10-06 ENCOUNTER — Encounter: Payer: 59 | Attending: Internal Medicine | Admitting: *Deleted

## 2020-10-06 VITALS — Ht 70.0 in | Wt 199.6 lb

## 2020-10-06 DIAGNOSIS — Z951 Presence of aortocoronary bypass graft: Secondary | ICD-10-CM | POA: Insufficient documentation

## 2020-10-06 NOTE — Progress Notes (Signed)
Cardiac Individual Treatment Plan  Patient Details  Name: Lisa [name] MRN: [id number] Date of Birth: 01-Mar-1961 Referring Provider:   Flowsheet Row Cardiac Rehab from 10/06/2020 in [facility] Cardiac and Pulmonary Rehab  Referring Provider [name] MD      Initial Encounter Date:  Flowsheet Row Cardiac Rehab from 10/06/2020 in [facility] Cardiac and Pulmonary Rehab  Date 10/06/20      Visit Diagnosis: S/P CABG x 3  Patient's Home Medications on Admission:  Current Outpatient Medications:  .  aspirin 81 MG tablet, Take 1 tablet by mouth daily., Disp: , Rfl:  .  atorvastatin (LIPITOR) 80 MG tablet, Take 1 tablet (80 mg total) by mouth daily at 6 PM., Disp: 90 tablet, Rfl: 3 .  Blood Glucose Monitoring Suppl (FIFTY50 GLUCOSE METER 2.0) W/DEVICE KIT, Use as directed., Disp: , Rfl:  .  clopidogrel (PLAVIX) 75 MG tablet, Take 75 mg by mouth daily. (Patient not taking: Reported on 08/11/2020), Disp: , Rfl:  .  glyBURIDE (DIABETA) 2.5 MG tablet, Take 1 tablet by mouth daily. (Patient not taking: Reported on 08/11/2020), Disp: , Rfl:  .  insulin glargine (LANTUS) 100 UNIT/ML injection, Inject 50 Units into the skin daily., Disp: , Rfl:  .  JARDIANCE 25 MG TABS tablet, Take 25 mg daily by mouth. (Patient not taking: Reported on 08/11/2020), Disp: , Rfl: 6 .  lisinopril (ZESTRIL) 5 MG tablet, Take 5 mg by mouth daily., Disp: , Rfl:  .  metoprolol succinate (TOPROL-XL) 25 MG 24 hr tablet, Take 25 mg by mouth daily., Disp: , Rfl:  .  metoprolol tartrate (LOPRESSOR) 25 MG tablet, Take by mouth., Disp: , Rfl:  .  MULTIPLE VITAMINS-MINERALS PO, Take 1 tablet by mouth daily., Disp: , Rfl:  .  omeprazole (PRILOSEC) 40 MG capsule, TAKE 1 CAPSULE BY MOUTH  DAILY, Disp: 90 capsule, Rfl: 0 .  ondansetron (ZOFRAN) 4 MG tablet, Take 1 tablet (4 mg total) by mouth every 8 (eight) hours as needed for nausea or vomiting. (Patient not taking: Reported on 07/29/2020), Disp: 30 tablet, Rfl: 0 .   sulfamethoxazole-trimethoprim (BACTRIM DS) 800-160 MG tablet, Take 1 tablet by mouth 2 (two) times daily. (Patient not taking: Reported on 08/11/2020), Disp: 6 tablet, Rfl: 0 .  terconazole (TERAZOL 7) 0.4 % vaginal cream, Place 1 applicator vaginally at bedtime., Disp: 45 g, Rfl: 0 .  ursodiol (ACTIGALL) 300 MG capsule, Take 300 mg by mouth daily. , Disp: , Rfl:   Past Medical History: Past Medical History:  Diagnosis Date  . Allergic rhinitis 02/15/2015  . Arthritis   . BP (high blood pressure) 02/15/2015  . Cervical lymphadenopathy   . Cirrhosis ([place])   . Diabetes mellitus   . Elevated liver enzymes 09/23/2015  . Elevated liver enzymes 09/23/2015  . GERD (gastroesophageal reflux disease)   . Gonalgia 02/15/2015  . Hernia, lateral ventral 11/19/2012  . Hypertension   . Intra-abdominal and pelvic swelling, mass and lump, unspecified site 11/19/2012  . Lymphadenopathy 03/18/2017    Tobacco Use: Social History   Tobacco Use  Smoking Status Never Smoker  Smokeless Tobacco Never Used    Labs: Recent Review Flowsheet Data    Labs for ITP Cardiac and Pulmonary Rehab Latest Ref Rng & Units 09/22/2015 05/22/2017 12/26/2017 11/05/2018 06/04/2019   Cholestrol 0 - 200 305(H) - 359(H) 355(A) -   LDLCALC - 205(H) - 211(H) 207 -   HDL 35 - 70 84 - 127 133(A) -   Trlycerides 40 - 160 82 -  106 74 -   Hemoglobin A1c - - 8.3 12.1(H) - 10.5       Exercise Target Goals: Exercise Program Goal: Individual exercise prescription set using results from initial 6 min walk test and THRR while considering  patient's activity barriers and safety.   Exercise Prescription Goal: Initial exercise prescription builds to 30-45 minutes a day of aerobic activity, 2-3 days per week.  Home exercise guidelines will be given to patient during program as part of exercise prescription that the participant will acknowledge.   Education: Aerobic Exercise: - Group verbal and visual presentation on the components of  exercise prescription. Introduces F.I.T.T principle from ACSM for exercise prescriptions.  Reviews F.I.T.T. principles of aerobic exercise including progression. Written material given at graduation.   Education: Resistance Exercise: - Group verbal and visual presentation on the components of exercise prescription. Introduces F.I.T.T principle from ACSM for exercise prescriptions  Reviews F.I.T.T. principles of resistance exercise including progression. Written material given at graduation.    Education: Exercise & Equipment Safety: - Individual verbal instruction and demonstration of equipment use and safety with use of the equipment. Flowsheet Row Cardiac Rehab from 10/06/2020 in [facility] Cardiac and Pulmonary Rehab  Date 10/06/20  Educator [facility]  Instruction Review Code 1- Verbalizes Understanding      Education: Exercise Physiology & General Exercise Guidelines: - Group verbal and written instruction with models to review the exercise physiology of the cardiovascular system and associated critical values. Provides general exercise guidelines with specific guidelines to those with heart or lung disease.    Education: Flexibility, Balance, Mind/Body Relaxation: - Group verbal and visual presentation with interactive activity on the components of exercise prescription. Introduces F.I.T.T principle from ACSM for exercise prescriptions. Reviews F.I.T.T. principles of flexibility and balance exercise training including progression. Also discusses the mind body connection.  Reviews various relaxation techniques to help reduce and manage stress (i.e. Deep breathing, progressive muscle relaxation, and visualization). Balance handout provided to take home. Written material given at graduation.   Activity Barriers & Risk Stratification:  Activity Barriers & Cardiac Risk Stratification - 10/06/20 1534      Activity Barriers & Cardiac Risk Stratification   Activity Barriers Incisional Pain;Left Knee  Replacement;Muscular Weakness;Shortness of Breath;Deconditioning;Balance Concerns    Cardiac Risk Stratification High           6 Minute Walk:  6 Minute Walk    Row Name 10/06/20 1533         6 Minute Walk   Phase Initial     Distance 890 feet     Walk Time 6 minutes     # of Rest Breaks 0     MPH 1.68     METS 2.76     RPE 11     VO2 Peak 9.67     Symptoms No     Resting HR 87 bpm     Resting BP 128/62     Resting Oxygen Saturation  98 %     Exercise Oxygen Saturation  during 6 min walk 94 %     Max Ex. HR 98 bpm     Max Ex. BP 134/72     2 Minute Post BP 126/64            Oxygen Initial Assessment:   Oxygen Re-Evaluation:   Oxygen Discharge (Final Oxygen Re-Evaluation):   Initial Exercise Prescription:  Initial Exercise Prescription - 10/06/20 1500      Date of Initial Exercise RX and Referring Provider  Date 10/06/20    Referring Provider [name] MD      Treadmill   MPH 1.6    Grade 0.5    Minutes 15    METs 2.32      Recumbant Bike   Level 1    RPM 50    Watts 22    Minutes 15    METs 2      REL-XR   Level 1    Speed 50    Minutes 15    METs 2      Prescription Details   Frequency (times per week) 3    Duration Progress to 30 minutes of continuous aerobic without signs/symptoms of physical distress      Intensity   THRR 40-80% of Max Heartrate 117-146    Ratings of Perceived Exertion 11-13    Perceived Dyspnea 0-4      Progression   Progression Continue to progress workloads to maintain intensity without signs/symptoms of physical distress.      Resistance Training   Training Prescription Yes    Weight 3 lb    Reps 10-15           Perform Capillary Blood Glucose checks as needed.  Exercise Prescription Changes:  Exercise Prescription Changes    Row Name 10/06/20 1500             Response to Exercise   Blood Pressure (Admit) 128/62       Blood Pressure (Exercise) 134/72       Blood Pressure (Exit)  126/64       Heart Rate (Admit) 87 bpm       Heart Rate (Exercise) 98 bpm       Heart Rate (Exit) 88 bpm       Oxygen Saturation (Admit) 98 %       Oxygen Saturation (Exercise) 94 %       Rating of Perceived Exertion (Exercise) 11       Symptoms none       Comments walk test results              Exercise Comments:   Exercise Goals and Review:  Exercise Goals    Row Name 10/06/20 1536             Exercise Goals   Increase Physical Activity Yes       Intervention Provide advice, education, support and counseling about physical activity/exercise needs.;Develop an individualized exercise prescription for aerobic and resistive training based on initial evaluation findings, risk stratification, comorbidities and participant's personal goals.       Expected Outcomes Short Term: Attend rehab on a regular basis to increase amount of physical activity.;Long Term: Exercising regularly at least 3-5 days a week.;Long Term: Add in home exercise to make exercise part of routine and to increase amount of physical activity.       Increase Strength and Stamina Yes       Intervention Provide advice, education, support and counseling about physical activity/exercise needs.;Develop an individualized exercise prescription for aerobic and resistive training based on initial evaluation findings, risk stratification, comorbidities and participant's personal goals.       Expected Outcomes Short Term: Increase workloads from initial exercise prescription for resistance, speed, and METs.;Short Term: Perform resistance training exercises routinely during rehab and add in resistance training at home;Long Term: Improve cardiorespiratory fitness, muscular endurance and strength as measured by increased METs and functional capacity (6MWT)       Able to  understand and use rate of perceived exertion (RPE) scale Yes       Intervention Provide education and explanation on how to use RPE scale       Expected Outcomes  Short Term: Able to use RPE daily in rehab to express subjective intensity level;Long Term:  Able to use RPE to guide intensity level when exercising independently       Able to understand and use Dyspnea scale Yes       Intervention Provide education and explanation on how to use Dyspnea scale       Expected Outcomes Long Term: Able to use Dyspnea scale to guide intensity level when exercising independently;Short Term: Able to use Dyspnea scale daily in rehab to express subjective sense of shortness of breath during exertion       Knowledge and understanding of Target Heart Rate Range (THRR) Yes       Intervention Provide education and explanation of THRR including how the numbers were predicted and where they are located for reference       Expected Outcomes Short Term: Able to state/look up THRR;Short Term: Able to use daily as guideline for intensity in rehab;Long Term: Able to use THRR to govern intensity when exercising independently       Able to check pulse independently Yes       Intervention Provide education and demonstration on how to check pulse in carotid and radial arteries.;Review the importance of being able to check your own pulse for safety during independent exercise       Expected Outcomes Short Term: Able to explain why pulse checking is important during independent exercise;Long Term: Able to check pulse independently and accurately       Understanding of Exercise Prescription Yes       Intervention Provide education, explanation, and written materials on patient's individual exercise prescription       Expected Outcomes Short Term: Able to explain program exercise prescription;Long Term: Able to explain home exercise prescription to exercise independently              Exercise Goals Re-Evaluation :   Discharge Exercise Prescription (Final Exercise Prescription Changes):  Exercise Prescription Changes - 10/06/20 1500      Response to Exercise   Blood Pressure (Admit)  128/62    Blood Pressure (Exercise) 134/72    Blood Pressure (Exit) 126/64    Heart Rate (Admit) 87 bpm    Heart Rate (Exercise) 98 bpm    Heart Rate (Exit) 88 bpm    Oxygen Saturation (Admit) 98 %    Oxygen Saturation (Exercise) 94 %    Rating of Perceived Exertion (Exercise) 11    Symptoms none    Comments walk test results           Nutrition:  Target Goals: Understanding of nutrition guidelines, daily intake of sodium <1533m, cholesterol <2018m calories 30% from fat and 7% or less from saturated fats, daily to have 5 or more servings of fruits and vegetables.  Education: All About Nutrition: -Group instruction provided by verbal, written material, interactive activities, discussions, models, and posters to present general guidelines for heart healthy nutrition including fat, fiber, MyPlate, the role of sodium in heart healthy nutrition, utilization of the nutrition label, and utilization of this knowledge for meal planning. Follow up email sent as well. Written material given at graduation. Flowsheet [facility]  Education need identified 10/06/20  Biometrics:  Pre Biometrics - 10/06/20 1537      Pre Biometrics   Height 5' 10"  (1.778 m)    Weight 199 lb 9.6 oz (90.5 kg)    BMI (Calculated) 28.64    Single Leg Stand 7.1 seconds            Nutrition Therapy Plan and Nutrition Goals:  Nutrition Therapy & Goals - 10/06/20 1538      Intervention Plan   Intervention Prescribe, educate and counsel regarding individualized specific dietary modifications aiming towards targeted core components such as weight, hypertension, lipid management, diabetes, heart failure and other comorbidities.    Expected Outcomes Short Term Goal: Understand basic principles of dietary content, such as calories, fat, sodium, cholesterol and nutrients.;Short Term Goal: A plan has been developed with personal nutrition goals set during  dietitian appointment.;Long Term Goal: Adherence to prescribed nutrition plan.           Nutrition Assessments:  MEDIFICTS Score Key:  ?70 Need to make dietary changes   40-70 Heart Healthy Diet  ? 40 Therapeutic Level Cholesterol Diet  Flowsheet Row Cardiac Rehab from 10/06/2020 in [facility] Cardiac and Pulmonary Rehab  Picture Your Plate Total Score on Admission 83     Picture Your Plate Scores:  <37 Unhealthy dietary pattern with much room for improvement.  41-50 Dietary pattern unlikely to meet recommendations for good health and room for improvement.  51-60 More healthful dietary pattern, with some room for improvement.   >60 Healthy dietary pattern, although there may be some specific behaviors that could be improved.    Nutrition Goals Re-Evaluation:   Nutrition Goals Discharge (Final Nutrition Goals Re-Evaluation):   Psychosocial: Target Goals: Acknowledge presence or absence of significant depression and/or stress, maximize coping skills, provide positive support system. Participant is able to verbalize types and ability to use techniques and skills needed for reducing stress and depression.   Education: Stress, Anxiety, and Depression - Group verbal and visual presentation to define topics covered.  Reviews how body is impacted by stress, anxiety, and depression.  Also discusses healthy ways to reduce stress and to treat/manage anxiety and depression.  Written material given at graduation.   Education: Sleep Hygiene -Provides group verbal and written instruction about how sleep can affect your health.  Define sleep hygiene, discuss sleep cycles and impact of sleep habits. Review good sleep hygiene tips.    Initial Review & Psychosocial Screening:  Initial Psych Review & Screening - 10/01/20 1307      Initial Review   Current issues with Current Stress Concerns    Source of Stress Concerns Unable to perform yard/household activities;Unable to participate in  former interests or hobbies;Family;Occupation      Family Dynamics   [place]? No   some local family   Strains Intra-family strains    Comments husband is not supportive; she states she has learned she can only count on herself and not other people      Barriers   Psychosocial barriers to participate in program There are no identifiable barriers or psychosocial needs.      Screening Interventions   Interventions Encouraged to exercise;To provide support and resources with identified psychosocial needs;Provide feedback about the scores to participant    Expected Outcomes Short Term goal: Utilizing psychosocial counselor, staff and physician to assist with identification of specific Stressors or current issues interfering with healing process. Setting desired goal for each stressor or current issue identified.;Long Term Goal: Stressors or current issues are  controlled or eliminated.;Short Term goal: Identification and review with participant of any Quality of Life or Depression concerns found by scoring the questionnaire.;Long Term goal: The participant improves quality of Life and PHQ9 Scores as seen by post scores and/or verbalization of changes           Quality of Life Scores:   Quality of Life - 10/06/20 1537      Quality of Life   Select Quality of Life      Quality of Life Scores   Health/Function Pre 26.53 %    Socioeconomic Pre 29.25 %    Psych/Spiritual Pre 30 %    Family Pre 25.9 %    GLOBAL Pre 27.89 %          Scores of 19 and below usually indicate a poorer quality of life in these areas.  A difference of  2-3 points is a clinically meaningful difference.  A difference of 2-3 points in the total score of the Quality of Life Index has been associated with significant improvement in overall quality of life, self-image, physical symptoms, and general health in studies assessing change in quality of life.  PHQ-9: Recent Review Flowsheet Data    Depression  screen [facility] 2/9 10/06/2020 07/18/2019 12/24/2017 09/20/2015   Decreased Interest 0 1 0 0   Down, Depressed, Hopeless 0 0 0 0   PHQ - 2 Score 0 1 0 0   Altered sleeping 0 0 0 -   Tired, decreased energy 1 2 0 -   Change in appetite 3 0 0 -   Feeling bad or failure about yourself  0 0 0 -   Trouble concentrating 0 0 0 -   Moving slowly or fidgety/restless 0 0 0 -   Suicidal thoughts 0 0 0 -   PHQ-9 Score 4 3 0 -   Difficult doing work/chores Not difficult at all Not difficult at all Not difficult at all -     Interpretation of Total Score  Total Score Depression Severity:  1-4 = Minimal depression, 5-9 = Mild depression, 10-14 = Moderate depression, 15-19 = Moderately severe depression, 20-27 = Severe depression   Psychosocial Evaluation and Intervention:  Psychosocial Evaluation - 10/01/20 1323      Psychosocial Evaluation & Interventions   Interventions Encouraged to exercise with the program and follow exercise prescription;Stress management education;Relaxation education    Comments Lisa [name] is referred to cardiac rehab post CABG x 3. She states she is feeling a little better each day and is ready to start the program so she can learn what she should be doing and how to eat and manage a heart healthy lifestyle. She is out of work for now, but plans on returning to [employer] when released where she was working night shift and a lot overtime. She is thankful that her supervisor has had a CABG in the past so she is being very supportive. Lasheka states that she and her husband are very disconnected, she does not feel supported at all by him. She prides herself in being a strong, indpendent woman and knows she has two feet to stand on financially and mentally if they were to separate. Her main focus right now is gaining her stamina back so she can live her life the way she wants to.    Expected Outcomes Short: attend cardiac rehab for education and exercise. Long: develop positive self care habits.     Continue Psychosocial Services  Follow up required by staff  Psychosocial Re-Evaluation:   Psychosocial Discharge (Final Psychosocial Re-Evaluation):   Vocational Rehabilitation: Provide vocational rehab assistance to qualifying candidates.   Vocational Rehab Evaluation & Intervention:  Vocational Rehab - 10/01/20 1307      Initial Vocational Rehab Evaluation & Intervention   Assessment shows need for Vocational Rehabilitation No           Education: Education Goals: Education classes will be provided on a variety of topics geared toward better understanding of heart health and risk factor modification. Participant will state understanding/return demonstration of topics presented as noted by education test scores.  Learning Barriers/Preferences:  Learning Barriers/Preferences - 10/01/20 1306      Learning Barriers/Preferences   Learning Barriers None    Learning Preferences None           General Cardiac Education Topics:  AED/CPR: - Group verbal and written instruction with the use of models to demonstrate the basic use of the AED with the basic ABC's of resuscitation.   Anatomy and Cardiac Procedures: - Group verbal and visual presentation and models provide information about basic cardiac anatomy and function. Reviews the testing methods done to diagnose heart disease and the outcomes of the test results. Describes the treatment choices: Medical Management, Angioplasty, or Coronary Bypass Surgery for treating various heart conditions including Myocardial Infarction, Angina, Valve Disease, and Cardiac Arrhythmias.  Written material given at graduation.   Medication Safety: - Group verbal and visual instruction to review commonly prescribed medications for heart and lung disease. Reviews the medication, class of the drug, and side effects. Includes the steps to properly store meds and maintain the prescription regimen.  Written material given at  graduation.   Intimacy: - Group verbal instruction through game format to discuss how heart and lung disease can affect sexual intimacy. Written material given at graduation..   Know Your Numbers and Heart Failure: - Group verbal and visual instruction to discuss disease risk factors for cardiac and pulmonary disease and treatment options.  Reviews associated critical values for Overweight/Obesity, Hypertension, Cholesterol, and Diabetes.  Discusses basics of heart failure: signs/symptoms and treatments.  Introduces Heart Failure Zone chart for action plan for heart failure.  Written material given at graduation. Flowsheet Row Cardiac Rehab from 10/06/2020 in [facility] Cardiac and Pulmonary Rehab  Education need identified 10/06/20      Infection Prevention: - Provides verbal and written material to individual with discussion of infection control including proper hand washing and proper equipment cleaning during exercise session. Flowsheet Row Cardiac Rehab from 10/06/2020 in [facility] Cardiac and Pulmonary Rehab  Date 10/06/20  Educator [facility]  Instruction Review Code 1- Verbalizes Understanding      Falls Prevention: - Provides verbal and written material to individual with discussion of falls prevention and safety. Flowsheet Row Cardiac Rehab from 10/06/2020 in [facility] Cardiac and Pulmonary Rehab  Date 10/01/20  Educator [facility]  Instruction Review Code 1- Verbalizes Understanding      Other: -Provides group and verbal instruction on various topics (see comments)   Knowledge Questionnaire Score:  Knowledge Questionnaire Score - 10/06/20 1538      Knowledge Questionnaire Score   Pre Score 22/24 (angina, BP, nutrition, tobacco)           Core Components/Risk Factors/Patient Goals at Admission:  Personal Goals and Risk Factors at Admission - 10/06/20 1539      Core Components/Risk Factors/Patient Goals on Admission    Weight Management Yes;Weight Loss    Intervention Weight Management: Develop  a combined nutrition and  exercise program designed to reach desired caloric intake, while maintaining appropriate intake of nutrient and fiber, sodium and fats, and appropriate energy expenditure required for the weight goal.;Weight Management: Provide education and appropriate resources to help participant work on and attain dietary goals.;Weight Management/Obesity: Establish reasonable short term and long term weight goals.    Admit Weight 199 lb 9.6 oz (90.5 kg)    Goal Weight: Short Term 195 lb (88.5 kg)    Goal Weight: Long Term 190 lb (86.2 kg)    Expected Outcomes Short Term: Continue to assess and modify interventions until short term weight is achieved;Long Term: Adherence to nutrition and physical activity/exercise program aimed toward attainment of established weight goal;Weight Loss: Understanding of general recommendations for a balanced deficit meal plan, which promotes 1-2 lb weight loss per week and includes a negative energy balance of [phone] kcal/d;Understanding recommendations for meals to include 15-35% energy as protein, 25-35% energy from fat, 35-60% energy from carbohydrates, less than 284m of dietary cholesterol, 20-35 gm of total fiber daily;Understanding of distribution of calorie intake throughout the day with the consumption of 4-5 meals/snacks    Diabetes Yes    Intervention Provide education about signs/symptoms and action to take for hypo/hyperglycemia.;Provide education about proper nutrition, including hydration, and aerobic/resistive exercise prescription along with prescribed medications to achieve blood glucose in normal ranges: Fasting glucose 65-99 mg/dL    Expected Outcomes Short Term: Participant verbalizes understanding of the signs/symptoms and immediate care of hyper/hypoglycemia, proper foot care and importance of medication, aerobic/resistive exercise and nutrition plan for blood glucose control.;Long Term: Attainment of HbA1C < 7%.    Hypertension Yes     Intervention Provide education on lifestyle modifcations including regular physical activity/exercise, weight management, moderate sodium restriction and increased consumption of fresh fruit, vegetables, and low fat dairy, alcohol moderation, and smoking cessation.;Monitor prescription use compliance.    Expected Outcomes Short Term: Continued assessment and intervention until BP is < 140/919mHG in hypertensive participants. < 130/8068mG in hypertensive participants with diabetes, heart failure or chronic kidney disease.;Long Term: Maintenance of blood pressure at goal levels.    Lipids Yes    Intervention Provide education and support for participant on nutrition & aerobic/resistive exercise along with prescribed medications to achieve LDL <98m42mDL >40mg28m Expected Outcomes Short Term: Participant states understanding of desired cholesterol values and is compliant with medications prescribed. Participant is following exercise prescription and nutrition guidelines.;Long Term: Cholesterol controlled with medications as prescribed, with individualized exercise RX and with personalized nutrition plan. Value goals: LDL < 98mg,28m > 40 mg.           Education:Diabetes - Individual verbal and written instruction to review signs/symptoms of diabetes, desired ranges of glucose level fasting, after meals and with exercise. Acknowledge that pre and post exercise glucose checks will be done for 3 sessions at entry of program. [place]  Date 10/01/20  Educator [facility]  [facility] Review Code 1- Verbalizes Understanding      Core Components/Risk Factors/Patient Goals Review:    Core Components/Risk Factors/Patient Goals at Discharge (Final Review):    ITP Comments:  ITP Comments    Row Name 10/01/20 1329 10/06/20 1533         ITP Comments Initial telephone orientation completed. Diagnosis can be found in [facility] EP  orientation scheduled for Wednesday 6/1 at 9:30 Completed 6MWT and gym orientation. Initial ITP created and sent for review to  Dr. Emily Filbert, Medical Director.             Comments: Initial ITP

## 2020-10-06 NOTE — Patient Instructions (Signed)
Patient Instructions  Patient Details  Name: Lisa [name] MRN: [id number] Date of Birth: [birth year] Referring Provider:  Yolonda Kida, MD  Below are your personal goals for exercise, nutrition, and risk factors. Our goal is to help you stay on track towards obtaining and maintaining these goals. We will be discussing your progress on these goals with you throughout the program.  Initial Exercise Prescription:  Initial Exercise Prescription - 10/06/20 1500      Date of Initial Exercise RX and Referring Provider   Date 10/06/20    Referring Provider [name] MD      Treadmill   MPH 1.6    Grade 0.5    Minutes 15    METs 2.32      Recumbant Bike   Level 1    RPM 50    Watts 22    Minutes 15    METs 2      REL-XR   Level 1    Speed 50    Minutes 15    METs 2      Prescription Details   Frequency (times per week) 3    Duration Progress to 30 minutes of continuous aerobic without signs/symptoms of physical distress      Intensity   THRR 40-80% of Max Heartrate 117-146    Ratings of Perceived Exertion 11-13    Perceived Dyspnea 0-4      Progression   Progression Continue to progress workloads to maintain intensity without signs/symptoms of physical distress.      Resistance Training   Training Prescription Yes    Weight 3 lb    Reps 10-15           Exercise Goals: Frequency: Be able to perform aerobic exercise two to three times per week in program working toward 2-5 days per week of home exercise.  Intensity: Work with a perceived exertion of 11 (fairly light) - 15 (hard) while following your exercise prescription.  We will make changes to your prescription with you as you progress through the program.   Duration: Be able to do 30 to 45 minutes of continuous aerobic exercise in addition to a 5 minute warm-up and a 5 minute cool-down routine.   Nutrition Goals: Your personal nutrition goals will be established when you do your nutrition  analysis with the dietician.  The following are general nutrition guidelines to follow: Cholesterol < 200mg /day Sodium < 1500mg /day Fiber: Women over 50 yrs - 21 grams per day  Personal Goals:  Personal Goals and Risk Factors at Admission - 10/06/20 1539      Core Components/Risk Factors/Patient Goals on Admission    Weight Management Yes;Weight Loss    Intervention Weight Management: Develop a combined nutrition and exercise program designed to reach desired caloric intake, while maintaining appropriate intake of nutrient and fiber, sodium and fats, and appropriate energy expenditure required for the weight goal.;Weight Management: Provide education and appropriate resources to help participant work on and attain dietary goals.;Weight Management/Obesity: Establish reasonable short term and long term weight goals.    Admit Weight 199 lb 9.6 oz (90.5 kg)    Goal Weight: Short Term 195 lb (88.5 kg)    Goal Weight: Long Term 190 lb (86.2 kg)    Expected Outcomes Short Term: Continue to assess and modify interventions until short term weight is achieved;Long Term: Adherence to nutrition and physical activity/exercise program aimed toward attainment of established weight goal;Weight Loss: Understanding of general recommendations for a balanced deficit  meal plan, which promotes 1-2 lb weight loss per week and includes a negative energy balance of [phone] kcal/d;Understanding recommendations for meals to include 15-35% energy as protein, 25-35% energy from fat, 35-60% energy from carbohydrates, less than 200mg  of dietary cholesterol, 20-35 gm of total fiber daily;Understanding of distribution of calorie intake throughout the day with the consumption of 4-5 meals/snacks    Diabetes Yes    Intervention Provide education about signs/symptoms and action to take for hypo/hyperglycemia.;Provide education about proper nutrition, including hydration, and aerobic/resistive exercise prescription along with  prescribed medications to achieve blood glucose in normal ranges: Fasting glucose 65-99 mg/dL    Expected Outcomes Short Term: Participant verbalizes understanding of the signs/symptoms and immediate care of hyper/hypoglycemia, proper foot care and importance of medication, aerobic/resistive exercise and nutrition plan for blood glucose control.;Long Term: Attainment of HbA1C < 7%.    Hypertension Yes    Intervention Provide education on lifestyle modifcations including regular physical activity/exercise, weight management, moderate sodium restriction and increased consumption of fresh fruit, vegetables, and low fat dairy, alcohol moderation, and smoking cessation.;Monitor prescription use compliance.    Expected Outcomes Short Term: Continued assessment and intervention until BP is < 140/48mm HG in hypertensive participants. < 130/17mm HG in hypertensive participants with diabetes, heart failure or chronic kidney disease.;Long Term: Maintenance of blood pressure at goal levels.    Lipids Yes    Intervention Provide education and support for participant on nutrition & aerobic/resistive exercise along with prescribed medications to achieve LDL 70mg , HDL >40mg .    Expected Outcomes Short Term: Participant states understanding of desired cholesterol values and is compliant with medications prescribed. Participant is following exercise prescription and nutrition guidelines.;Long Term: Cholesterol controlled with medications as prescribed, with individualized exercise RX and with personalized nutrition plan. Value goals: LDL < 70mg , HDL > 40 mg.           Tobacco Use Initial Evaluation: Social History   Tobacco Use  Smoking Status Never Smoker  Smokeless Tobacco Never Used    Exercise Goals and Review:  Exercise Goals    Row Name 10/06/20 1536             Exercise Goals   Increase Physical Activity Yes       Intervention Provide advice, education, support and counseling about physical  activity/exercise needs.;Develop an individualized exercise prescription for aerobic and resistive training based on initial evaluation findings, risk stratification, comorbidities and participant's personal goals.       Expected Outcomes Short Term: Attend rehab on a regular basis to increase amount of physical activity.;Long Term: Exercising regularly at least 3-5 days a week.;Long Term: Add in home exercise to make exercise part of routine and to increase amount of physical activity.       Increase Strength and Stamina Yes       Intervention Provide advice, education, support and counseling about physical activity/exercise needs.;Develop an individualized exercise prescription for aerobic and resistive training based on initial evaluation findings, risk stratification, comorbidities and participant's personal goals.       Expected Outcomes Short Term: Increase workloads from initial exercise prescription for resistance, speed, and METs.;Short Term: Perform resistance training exercises routinely during rehab and add in resistance training at home;Long Term: Improve cardiorespiratory fitness, muscular endurance and strength as measured by increased METs and functional capacity (6MWT)       Able to understand and use rate of perceived exertion (RPE) scale Yes       Intervention Provide education and explanation  on how to use RPE scale       Expected Outcomes Short Term: Able to use RPE daily in rehab to express subjective intensity level;Long Term:  Able to use RPE to guide intensity level when exercising independently       Able to understand and use Dyspnea scale Yes       Intervention Provide education and explanation on how to use Dyspnea scale       Expected Outcomes Long Term: Able to use Dyspnea scale to guide intensity level when exercising independently;Short Term: Able to use Dyspnea scale daily in rehab to express subjective sense of shortness of breath during exertion       Knowledge and  understanding of Target Heart Rate Range (THRR) Yes       Intervention Provide education and explanation of THRR including how the numbers were predicted and where they are located for reference       Expected Outcomes Short Term: Able to state/look up THRR;Short Term: Able to use daily as guideline for intensity in rehab;Long Term: Able to use THRR to govern intensity when exercising independently       Able to check pulse independently Yes       Intervention Provide education and demonstration on how to check pulse in carotid and radial arteries.;Review the importance of being able to check your own pulse for safety during independent exercise       Expected Outcomes Short Term: Able to explain why pulse checking is important during independent exercise;Long Term: Able to check pulse independently and accurately       Understanding of Exercise Prescription Yes       Intervention Provide education, explanation, and written materials on patient's individual exercise prescription       Expected Outcomes Short Term: Able to explain program exercise prescription;Long Term: Able to explain home exercise prescription to exercise independently              Copy of goals given to participant.

## 2020-10-09 ENCOUNTER — Other Ambulatory Visit: Payer: Self-pay | Admitting: Family Medicine

## 2020-10-09 NOTE — Telephone Encounter (Signed)
Requested medications are due for refill today yes  Requested medications are on the active medication list yes  Last refill 4/3 (mail order)  Last visit annual physical 07/2019, see 07/2020 for urine difficulties  Future visit scheduled no  Notes to clinic Failed protocol due to no valid visit within 12  months, no upcoming visit scheduled.

## 2020-10-18 ENCOUNTER — Encounter: Payer: 59 | Admitting: *Deleted

## 2020-10-18 ENCOUNTER — Other Ambulatory Visit: Payer: Self-pay

## 2020-10-18 DIAGNOSIS — Z951 Presence of aortocoronary bypass graft: Secondary | ICD-10-CM

## 2020-10-18 LAB — GLUCOSE, CAPILLARY
Glucose-Capillary: 152 mg/dL — ABNORMAL HIGH (ref 70–99)
Glucose-Capillary: 112 mg/dL — ABNORMAL HIGH (ref 70–99)

## 2020-10-18 NOTE — Progress Notes (Signed)
Daily Session Note  Patient Details  Name: Lisa [name] MRN: [id number] Date of Birth: [birth year] Referring Provider:   Flowsheet Row Cardiac Rehab from 10/06/2020 in [facility] Cardiac and Pulmonary Rehab  Referring Provider [name] MD       Encounter Date: 10/18/2020  Check In:  Session Check In - 10/18/20 1413       Check-In   Supervising physician immediately available to respond to emergencies See telemetry face sheet for immediately available ER MD    Location [facility]-Cardiac & Pulmonary Rehab    Staff Present Heath Lark, RN, BSN, CCRP;Joseph Hood RCP,RRT,BSRT;Kelly [place], [place], ACSM CEP, Exercise Physiologist    Virtual Visit No    Medication changes reported     No    Fall or balance concerns reported    No    Warm-up and Cool-down Performed on first and last piece of equipment    Resistance Training Performed Yes    VAD Patient? No    PAD/SET Patient? No      Pain Assessment   Currently in Pain? No/denies                Social History   Tobacco Use  Smoking Status Never  Smokeless Tobacco Never    Goals Met:  Exercise tolerated well Personal goals reviewed No report of cardiac concerns or symptoms  Goals Unmet:  Not Applicable  Comments: First full day of exercise!  Patient was oriented to gym and equipment including functions, settings, policies, and procedures.  Patient's individual exercise prescription and treatment plan were reviewed.  All starting workloads were established based on the results of the 6 minute walk test done at initial orientation visit.  The plan for exercise progression was also introduced and progression will be customized based on patient's performance and goals.    Dr. Emily Filbert is Medical Director for [place].  Dr. Ottie Glazier is Medical Director for [facility] Pulmonary Rehabilitation.

## 2020-10-20 ENCOUNTER — Other Ambulatory Visit: Payer: Self-pay

## 2020-10-20 ENCOUNTER — Encounter: Payer: Self-pay | Admitting: *Deleted

## 2020-10-20 DIAGNOSIS — Z951 Presence of aortocoronary bypass graft: Secondary | ICD-10-CM

## 2020-10-20 LAB — GLUCOSE, CAPILLARY
Glucose-Capillary: 55 mg/dL — ABNORMAL LOW (ref 70–99)
Glucose-Capillary: 71 mg/dL (ref 70–99)
Glucose-Capillary: 74 mg/dL (ref 70–99)
Glucose-Capillary: 106 mg/dL — ABNORMAL HIGH (ref 70–99)

## 2020-10-20 NOTE — Progress Notes (Signed)
Incomplete Session Note  Patient Details  Name: Lisa [name] MRN: [id number] Date of Birth: 1961/05/08 Referring Provider:   Flowsheet Row Cardiac Rehab from 10/06/2020 in [facility] Cardiac and Pulmonary Rehab  Referring Provider Lisa [name]       Lisa [name] did not complete her rehab session.  Lisa [name]'s blood glucose level was 71 upon arrival. Pt given glucose tabs and crackers with glucose recheck results: 55. Pt given additional glucose tabs and recheck at 74. Pt given 12 ounces of orange juice and recheck result: 106. Patient educated about eating prior to coming to exercise and desired parameters to begin exercising. Patient stated understanding and that she was going to eat after leaving.

## 2020-10-20 NOTE — Progress Notes (Signed)
Cardiac Individual Treatment Plan  Patient Details  Name: Makalah Asberry MRN: [id number] Date of Birth: 05-Apr-1961 Referring Provider:   Flowsheet Row Cardiac Rehab from 10/06/2020 in [facility] Cardiac and Pulmonary Rehab  Referring Provider [name] MD       Initial Encounter Date:  Flowsheet Row Cardiac Rehab from 10/06/2020 in [facility] Cardiac and Pulmonary Rehab  Date 10/06/20       Visit Diagnosis: S/P CABG x 3  Patient's Home Medications on Admission:  Current Outpatient Medications:    aspirin 81 MG tablet, Take 1 tablet by mouth daily., Disp: , Rfl:    atorvastatin (LIPITOR) 80 MG tablet, Take 1 tablet (80 mg total) by mouth daily at 6 PM., Disp: 90 tablet, Rfl: 3   Blood Glucose Monitoring Suppl (FIFTY50 GLUCOSE METER 2.0) W/DEVICE KIT, Use as directed., Disp: , Rfl:    clopidogrel (PLAVIX) 75 MG tablet, Take 75 mg by mouth daily. (Patient not taking: Reported on 08/11/2020), Disp: , Rfl:    glyBURIDE (DIABETA) 2.5 MG tablet, Take 1 tablet by mouth daily. (Patient not taking: Reported on 08/11/2020), Disp: , Rfl:    insulin glargine (LANTUS) 100 UNIT/ML injection, Inject 50 Units into the skin daily., Disp: , Rfl:    JARDIANCE 25 MG TABS tablet, Take 25 mg daily by mouth. (Patient not taking: Reported on 08/11/2020), Disp: , Rfl: 6   lisinopril (ZESTRIL) 5 MG tablet, Take 5 mg by mouth daily., Disp: , Rfl:    metoprolol succinate (TOPROL-XL) 25 MG 24 hr tablet, Take 25 mg by mouth daily., Disp: , Rfl:    metoprolol tartrate (LOPRESSOR) 25 MG tablet, Take by mouth., Disp: , Rfl:    MULTIPLE VITAMINS-MINERALS PO, Take 1 tablet by mouth daily., Disp: , Rfl:    omeprazole (PRILOSEC) 40 MG capsule, TAKE 1 CAPSULE BY MOUTH  DAILY, Disp: 90 capsule, Rfl: 3   ondansetron (ZOFRAN) 4 MG tablet, Take 1 tablet (4 mg total) by mouth every 8 (eight) hours as needed for nausea or vomiting. (Patient not taking: Reported on 07/29/2020), Disp: 30 tablet, Rfl: 0   sulfamethoxazole-trimethoprim  (BACTRIM DS) 800-160 MG tablet, Take 1 tablet by mouth 2 (two) times daily. (Patient not taking: Reported on 08/11/2020), Disp: 6 tablet, Rfl: 0   terconazole (TERAZOL 7) 0.4 % vaginal cream, Place 1 applicator vaginally at bedtime., Disp: 45 g, Rfl: 0   ursodiol (ACTIGALL) 300 MG capsule, Take 300 mg by mouth daily. , Disp: , Rfl:   Past Medical History: Past Medical History:  Diagnosis Date   Allergic rhinitis 02/15/2015   Arthritis    BP (high blood pressure) 02/15/2015   Cervical lymphadenopathy    Cirrhosis ([place])    Diabetes mellitus    Elevated liver enzymes 09/23/2015   Elevated liver enzymes 09/23/2015   GERD (gastroesophageal reflux disease)    Gonalgia 02/15/2015   Hernia, lateral ventral 11/19/2012   Hypertension    Intra-abdominal and pelvic swelling, mass and lump, unspecified site 11/19/2012   Lymphadenopathy 03/18/2017    Tobacco Use: Social History   Tobacco Use  Smoking Status Never  Smokeless Tobacco Never    Labs: Recent Review Flowsheet Data     Labs for ITP Cardiac and Pulmonary Rehab Latest Ref Rng & Units 09/22/2015 05/22/2017 12/26/2017 11/05/2018 06/04/2019   Cholestrol 0 - 200 305(H) - 359(H) 355(A) -   LDLCALC - 205(H) - 211(H) 207 -   HDL 35 - 70 84 - 127 133(A) -   Trlycerides 40 - 160 82 -  106 74 -   Hemoglobin A1c - - 8.3 12.1(H) - 10.5        Exercise Target Goals: Exercise Program Goal: Individual exercise prescription set using results from initial 6 min walk test and THRR while considering  patient's activity barriers and safety.   Exercise Prescription Goal: Initial exercise prescription builds to 30-45 minutes a day of aerobic activity, 2-3 days per week.  Home exercise guidelines will be given to patient during program as part of exercise prescription that the participant will acknowledge.   Education: Aerobic Exercise: - Group verbal and visual presentation on the components of exercise prescription. Introduces F.I.T.T principle from  ACSM for exercise prescriptions.  Reviews F.I.T.T. principles of aerobic exercise including progression. Written material given at graduation.   Education: Resistance Exercise: - Group verbal and visual presentation on the components of exercise prescription. Introduces F.I.T.T principle from ACSM for exercise prescriptions  Reviews F.I.T.T. principles of resistance exercise including progression. Written material given at graduation.    Education: Exercise & Equipment Safety: - Individual verbal instruction and demonstration of equipment use and safety with use of the equipment. Flowsheet Row Cardiac Rehab from 10/06/2020 in [facility] Cardiac and Pulmonary Rehab  Date 10/06/20  Educator [facility]  Instruction Review Code 1- Verbalizes Understanding       Education: Exercise Physiology & General Exercise Guidelines: - Group verbal and written instruction with models to review the exercise physiology of the cardiovascular system and associated critical values. Provides general exercise guidelines with specific guidelines to those with heart or lung disease.    Education: Flexibility, Balance, Mind/Body Relaxation: - Group verbal and visual presentation with interactive activity on the components of exercise prescription. Introduces F.I.T.T principle from ACSM for exercise prescriptions. Reviews F.I.T.T. principles of flexibility and balance exercise training including progression. Also discusses the mind body connection.  Reviews various relaxation techniques to help reduce and manage stress (i.e. Deep breathing, progressive muscle relaxation, and visualization). Balance handout provided to take home. Written material given at graduation.   Activity Barriers & Risk Stratification:  Activity Barriers & Cardiac Risk Stratification - 10/06/20 1534       Activity Barriers & Cardiac Risk Stratification   Activity Barriers Incisional Pain;Left Knee Replacement;Muscular Weakness;Shortness of  Breath;Deconditioning;Balance Concerns    Cardiac Risk Stratification High             6 Minute Walk:  6 Minute Walk     Row Name 10/06/20 1533         6 Minute Walk   Phase Initial     Distance 890 feet     Walk Time 6 minutes     # of Rest Breaks 0     MPH 1.68     METS 2.76     RPE 11     VO2 Peak 9.67     Symptoms No     Resting HR 87 bpm     Resting BP 128/62     Resting Oxygen Saturation  98 %     Exercise Oxygen Saturation  during 6 min walk 94 %     Max Ex. HR 98 bpm     Max Ex. BP 134/72     2 Minute Post BP 126/64              Oxygen Initial Assessment:   Oxygen Re-Evaluation:   Oxygen Discharge (Final Oxygen Re-Evaluation):   Initial Exercise Prescription:  Initial Exercise Prescription - 10/06/20 1500  Date of Initial Exercise RX and Referring Provider   Date 10/06/20    Referring Provider [name] MD      Treadmill   MPH 1.6    Grade 0.5    Minutes 15    METs 2.32      Recumbant Bike   Level 1    RPM 50    Watts 22    Minutes 15    METs 2      REL-XR   Level 1    Speed 50    Minutes 15    METs 2      Prescription Details   Frequency (times per week) 3    Duration Progress to 30 minutes of continuous aerobic without signs/symptoms of physical distress      Intensity   THRR 40-80% of Max Heartrate 117-146    Ratings of Perceived Exertion 11-13    Perceived Dyspnea 0-4      Progression   Progression Continue to progress workloads to maintain intensity without signs/symptoms of physical distress.      Resistance Training   Training Prescription Yes    Weight 3 lb    Reps 10-15             Perform Capillary Blood Glucose checks as needed.  Exercise Prescription Changes:   Exercise Prescription Changes     Row Name 10/06/20 1500             Response to Exercise   Blood Pressure (Admit) 128/62       Blood Pressure (Exercise) 134/72       Blood Pressure (Exit) 126/64       Heart Rate  (Admit) 87 bpm       Heart Rate (Exercise) 98 bpm       Heart Rate (Exit) 88 bpm       Oxygen Saturation (Admit) 98 %       Oxygen Saturation (Exercise) 94 %       Rating of Perceived Exertion (Exercise) 11       Symptoms none       Comments walk test results                Exercise Comments:   Exercise Comments     Row Name 10/18/20 1416           Exercise Comments First full day of exercise!  Patient was oriented to gym and equipment including functions, settings, policies, and procedures.  Patient's individual exercise prescription and treatment plan were reviewed.  All starting workloads were established based on the results of the 6 minute walk test done at initial orientation visit.  The plan for exercise progression was also introduced and progression will be customized based on patient's performance and goals.                Exercise Goals and Review:   Exercise Goals     Row Name 10/06/20 1536             Exercise Goals   Increase Physical Activity Yes       Intervention Provide advice, education, support and counseling about physical activity/exercise needs.;Develop an individualized exercise prescription for aerobic and resistive training based on initial evaluation findings, risk stratification, comorbidities and participant's personal goals.       Expected Outcomes Short Term: Attend rehab on a regular basis to increase amount of physical activity.;Long Term: Exercising regularly at least 3-5 days a week.;Long Term: Add in  home exercise to make exercise part of routine and to increase amount of physical activity.       Increase Strength and Stamina Yes       Intervention Provide advice, education, support and counseling about physical activity/exercise needs.;Develop an individualized exercise prescription for aerobic and resistive training based on initial evaluation findings, risk stratification, comorbidities and participant's personal goals.        Expected Outcomes Short Term: Increase workloads from initial exercise prescription for resistance, speed, and METs.;Short Term: Perform resistance training exercises routinely during rehab and add in resistance training at home;Long Term: Improve cardiorespiratory fitness, muscular endurance and strength as measured by increased METs and functional capacity (6MWT)       Able to understand and use rate of perceived exertion (RPE) scale Yes       Intervention Provide education and explanation on how to use RPE scale       Expected Outcomes Short Term: Able to use RPE daily in rehab to express subjective intensity level;Long Term:  Able to use RPE to guide intensity level when exercising independently       Able to understand and use Dyspnea scale Yes       Intervention Provide education and explanation on how to use Dyspnea scale       Expected Outcomes Long Term: Able to use Dyspnea scale to guide intensity level when exercising independently;Short Term: Able to use Dyspnea scale daily in rehab to express subjective sense of shortness of breath during exertion       Knowledge and understanding of Target Heart Rate Range (THRR) Yes       Intervention Provide education and explanation of THRR including how the numbers were predicted and where they are located for reference       Expected Outcomes Short Term: Able to state/look up THRR;Short Term: Able to use daily as guideline for intensity in rehab;Long Term: Able to use THRR to govern intensity when exercising independently       Able to check pulse independently Yes       Intervention Provide education and demonstration on how to check pulse in carotid and radial arteries.;Review the importance of being able to check your own pulse for safety during independent exercise       Expected Outcomes Short Term: Able to explain why pulse checking is important during independent exercise;Long Term: Able to check pulse independently and accurately        Understanding of Exercise Prescription Yes       Intervention Provide education, explanation, and written materials on patient's individual exercise prescription       Expected Outcomes Short Term: Able to explain program exercise prescription;Long Term: Able to explain home exercise prescription to exercise independently                Exercise Goals Re-Evaluation :  Exercise Goals Re-Evaluation     Row Name 10/18/20 1416             Exercise Goal Re-Evaluation   Exercise Goals Review Able to understand and use rate of perceived exertion (RPE) scale;Able to understand and use Dyspnea scale;Knowledge and understanding of Target Heart Rate Range (THRR);Understanding of Exercise Prescription       Comments Reviewed RPE and dyspnea scales, THR and program prescription with pt today.  Pt voiced understanding and was given a copy of goals to take home.       Expected Outcomes Short: Use RPE daily to regulate intensity. Long:  Follow program prescription in THR.                Discharge Exercise Prescription (Final Exercise Prescription Changes):  Exercise Prescription Changes - 10/06/20 1500       Response to Exercise   Blood Pressure (Admit) 128/62    Blood Pressure (Exercise) 134/72    Blood Pressure (Exit) 126/64    Heart Rate (Admit) 87 bpm    Heart Rate (Exercise) 98 bpm    Heart Rate (Exit) 88 bpm    Oxygen Saturation (Admit) 98 %    Oxygen Saturation (Exercise) 94 %    Rating of Perceived Exertion (Exercise) 11    Symptoms none    Comments walk test results             Nutrition:  Target Goals: Understanding of nutrition guidelines, daily intake of sodium '1500mg'$ , cholesterol '200mg'$ , calories 30% from fat and 7% or less from saturated fats, daily to have 5 or more servings of fruits and vegetables.  Education: All About Nutrition: -Group instruction provided by verbal, written material, interactive activities, discussions, models, and posters to present  general guidelines for heart healthy nutrition including fat, fiber, MyPlate, the role of sodium in heart healthy nutrition, utilization of the nutrition label, and utilization of this knowledge for meal planning. Follow up email sent as well. Written material given at graduation. Flowsheet Row Cardiac Rehab from 10/06/2020 in [facility] Cardiac and Pulmonary Rehab  Education need identified 10/06/20       Biometrics:  Pre Biometrics - 10/06/20 1537       Pre Biometrics   Height $Remov'5\' 10"'YDsgHX$  (1.778 m)    Weight 199 lb 9.6 oz (90.5 kg)    BMI (Calculated) 28.64    Single Leg Stand 7.1 seconds              Nutrition Therapy Plan and Nutrition Goals:  Nutrition Therapy & Goals - 10/06/20 1538       Intervention Plan   Intervention Prescribe, educate and counsel regarding individualized specific dietary modifications aiming towards targeted core components such as weight, hypertension, lipid management, diabetes, heart failure and other comorbidities.    Expected Outcomes Short Term Goal: Understand basic principles of dietary content, such as calories, fat, sodium, cholesterol and nutrients.;Short Term Goal: A plan has been developed with personal nutrition goals set during dietitian appointment.;Long Term Goal: Adherence to prescribed nutrition plan.             Nutrition Assessments:  MEDIFICTS Score Key: ?70 Need to make dietary changes  40-70 Heart Healthy Diet ? 40 Therapeutic Level Cholesterol Diet  Flowsheet Row Cardiac Rehab from 10/06/2020 in [facility] Cardiac and Pulmonary Rehab  Picture Your Plate Total Score on Admission 83      Picture Your Plate Scores: <51 Unhealthy dietary pattern with much room for improvement. 41-50 Dietary pattern unlikely to meet recommendations for good health and room for improvement. 51-60 More healthful dietary pattern, with some room for improvement.  >60 Healthy dietary pattern, although there may be some specific behaviors that could be  improved.    Nutrition Goals Re-Evaluation:   Nutrition Goals Discharge (Final Nutrition Goals Re-Evaluation):   Psychosocial: Target Goals: Acknowledge presence or absence of significant depression and/or stress, maximize coping skills, provide positive support system. Participant is able to verbalize types and ability to use techniques and skills needed for reducing stress and depression.   Education: Stress, Anxiety, and Depression - Group verbal and visual presentation to define topics  covered.  Reviews how body is impacted by stress, anxiety, and depression.  Also discusses healthy ways to reduce stress and to treat/manage anxiety and depression.  Written material given at graduation.   Education: Sleep Hygiene -Provides group verbal and written instruction about how sleep can affect your health.  Define sleep hygiene, discuss sleep cycles and impact of sleep habits. Review good sleep hygiene tips.    Initial Review & Psychosocial Screening:  Initial Psych Review & Screening - 10/01/20 1307       Initial Review   Current issues with Current Stress Concerns    Source of Stress Concerns Unable to perform yard/household activities;Unable to participate in former interests or hobbies;Family;Occupation      Family Dynamics   [place]? No   some local family   Strains Intra-family strains    Comments husband is not supportive; she states she has learned she can only count on herself and not other people      Barriers   Psychosocial barriers to participate in program There are no identifiable barriers or psychosocial needs.      Screening Interventions   Interventions Encouraged to exercise;To provide support and resources with identified psychosocial needs;Provide feedback about the scores to participant    Expected Outcomes Short Term goal: Utilizing psychosocial counselor, staff and physician to assist with identification of specific Stressors or current issues  interfering with healing process. Setting desired goal for each stressor or current issue identified.;Long Term Goal: Stressors or current issues are controlled or eliminated.;Short Term goal: Identification and review with participant of any Quality of Life or Depression concerns found by scoring the questionnaire.;Long Term goal: The participant improves quality of Life and PHQ9 Scores as seen by post scores and/or verbalization of changes             Quality of Life Scores:   Quality of Life - 10/06/20 1537       Quality of Life   Select Quality of Life      Quality of Life Scores   Health/Function Pre 26.53 %    Socioeconomic Pre 29.25 %    Psych/Spiritual Pre 30 %    Family Pre 25.9 %    GLOBAL Pre 27.89 %            Scores of 19 and below usually indicate a poorer quality of life in these areas.  A difference of  2-3 points is a clinically meaningful difference.  A difference of 2-3 points in the total score of the Quality of Life Index has been associated with significant improvement in overall quality of life, self-image, physical symptoms, and general health in studies assessing change in quality of life.  PHQ-9: Recent Review Flowsheet Data     Depression screen [facility] 2/9 10/06/2020 07/18/2019 12/24/2017 09/20/2015   Decreased Interest 0 1 0 0   Down, Depressed, Hopeless 0 0 0 0   PHQ - 2 Score 0 1 0 0   Altered sleeping 0 0 0 -   Tired, decreased energy 1 2 0 -   Change in appetite 3 0 0 -   Feeling bad or failure about yourself  0 0 0 -   Trouble concentrating 0 0 0 -   Moving slowly or fidgety/restless 0 0 0 -   Suicidal thoughts 0 0 0 -   PHQ-9 Score 4 3 0 -   Difficult doing work/chores Not difficult at all Not difficult at all Not difficult at all -  Interpretation of Total Score  Total Score Depression Severity:  1-4 = Minimal depression, 5-9 = Mild depression, 10-14 = Moderate depression, 15-19 = Moderately severe depression, 20-27 = Severe depression    Psychosocial Evaluation and Intervention:  Psychosocial Evaluation - 10/01/20 1323       Psychosocial Evaluation & Interventions   Interventions Encouraged to exercise with the program and follow exercise prescription;Stress management education;Relaxation education    Comments Devani is referred to cardiac rehab post CABG x 3. She states she is feeling a little better each day and is ready to start the program so she can learn what she should be doing and how to eat and manage a heart healthy lifestyle. She is out of work for now, but plans on returning to [employer] when released where she was working night shift and a lot overtime. She is thankful that her supervisor has had a CABG in the past so she is being very supportive. Beckey states that she and her husband are very disconnected, she does not feel supported at all by him. She prides herself in being a strong, indpendent woman and knows she has two feet to stand on financially and mentally if they were to separate. Her main focus right now is gaining her stamina back so she can live her life the way she wants to.    Expected Outcomes Short: attend cardiac rehab for education and exercise. Long: develop positive self care habits.    Continue Psychosocial Services  Follow up required by staff             Psychosocial Re-Evaluation:   Psychosocial Discharge (Final Psychosocial Re-Evaluation):   Vocational Rehabilitation: Provide vocational rehab assistance to qualifying candidates.   Vocational Rehab Evaluation & Intervention:  Vocational Rehab - 10/01/20 1307       Initial Vocational Rehab Evaluation & Intervention   Assessment shows need for Vocational Rehabilitation No             Education: Education Goals: Education classes will be provided on a variety of topics geared toward better understanding of heart health and risk factor modification. Participant will state understanding/return demonstration of topics  presented as noted by education test scores.  Learning Barriers/Preferences:  Learning Barriers/Preferences - 10/01/20 1306       Learning Barriers/Preferences   Learning Barriers None    Learning Preferences None             General Cardiac Education Topics:  AED/CPR: - Group verbal and written instruction with the use of models to demonstrate the basic use of the AED with the basic ABC's of resuscitation.   Anatomy and Cardiac Procedures: - Group verbal and visual presentation and models provide information about basic cardiac anatomy and function. Reviews the testing methods done to diagnose heart disease and the outcomes of the test results. Describes the treatment choices: Medical Management, Angioplasty, or Coronary Bypass Surgery for treating various heart conditions including Myocardial Infarction, Angina, Valve Disease, and Cardiac Arrhythmias.  Written material given at graduation.   Medication Safety: - Group verbal and visual instruction to review commonly prescribed medications for heart and lung disease. Reviews the medication, class of the drug, and side effects. Includes the steps to properly store meds and maintain the prescription regimen.  Written material given at graduation.   Intimacy: - Group verbal instruction through game format to discuss how heart and lung disease can affect sexual intimacy. Written material given at graduation..   Know Your Numbers and Heart  Failure: - Group verbal and visual instruction to discuss disease risk factors for cardiac and pulmonary disease and treatment options.  Reviews associated critical values for Overweight/Obesity, Hypertension, Cholesterol, and Diabetes.  Discusses basics of heart failure: signs/symptoms and treatments.  Introduces Heart Failure Zone chart for action plan for heart failure.  Written material given at graduation. Flowsheet Row Cardiac Rehab from 10/06/2020 in [facility] Cardiac and Pulmonary Rehab  Education  need identified 10/06/20       Infection Prevention: - Provides verbal and written material to individual with discussion of infection control including proper hand washing and proper equipment cleaning during exercise session. Flowsheet Row Cardiac Rehab from 10/06/2020 in [facility] Cardiac and Pulmonary Rehab  Date 10/06/20  Educator [facility]  Instruction Review Code 1- Verbalizes Understanding       Falls Prevention: - Provides verbal and written material to individual with discussion of falls prevention and safety. Flowsheet Row Cardiac Rehab from 10/06/2020 in [facility] Cardiac and Pulmonary Rehab  Date 10/01/20  Educator [facility]  Instruction Review Code 1- Verbalizes Understanding       Other: -Provides group and verbal instruction on various topics (see comments)   Knowledge Questionnaire Score:  Knowledge Questionnaire Score - 10/06/20 1538       Knowledge Questionnaire Score   Pre Score 22/24 (angina, BP, nutrition, tobacco)             Core Components/Risk Factors/Patient Goals at Admission:  Personal Goals and Risk Factors at Admission - 10/06/20 1539       Core Components/Risk Factors/Patient Goals on Admission    Weight Management Yes;Weight Loss    Intervention Weight Management: Develop a combined nutrition and exercise program designed to reach desired caloric intake, while maintaining appropriate intake of nutrient and fiber, sodium and fats, and appropriate energy expenditure required for the weight goal.;Weight Management: Provide education and appropriate resources to help participant work on and attain dietary goals.;Weight Management/Obesity: Establish reasonable short term and long term weight goals.    Admit Weight 199 lb 9.6 oz (90.5 kg)    Goal Weight: Short Term 195 lb (88.5 kg)    Goal Weight: Long Term 190 lb (86.2 kg)    Expected Outcomes Short Term: Continue to assess and modify interventions until short term weight is achieved;Long Term: Adherence to  nutrition and physical activity/exercise program aimed toward attainment of established weight goal;Weight Loss: Understanding of general recommendations for a balanced deficit meal plan, which promotes 1-2 lb weight loss per week and includes a negative energy balance of [phone] kcal/d;Understanding recommendations for meals to include 15-35% energy as protein, 25-35% energy from fat, 35-60% energy from carbohydrates, less than $RemoveB'200mg'fJHhTFSR$  of dietary cholesterol, 20-35 gm of total fiber daily;Understanding of distribution of calorie intake throughout the day with the consumption of 4-5 meals/snacks    Diabetes Yes    Intervention Provide education about signs/symptoms and action to take for hypo/hyperglycemia.;Provide education about proper nutrition, including hydration, and aerobic/resistive exercise prescription along with prescribed medications to achieve blood glucose in normal ranges: Fasting glucose 65-99 mg/dL    Expected Outcomes Short Term: Participant verbalizes understanding of the signs/symptoms and immediate care of hyper/hypoglycemia, proper foot care and importance of medication, aerobic/resistive exercise and nutrition plan for blood glucose control.;Long Term: Attainment of HbA1C < 7%.    Hypertension Yes    Intervention Provide education on lifestyle modifcations including regular physical activity/exercise, weight management, moderate sodium restriction and increased consumption of fresh fruit, vegetables, and low fat dairy, alcohol moderation, and  smoking cessation.;Monitor prescription use compliance.    Expected Outcomes Short Term: Continued assessment and intervention until BP is < 140/57mm HG in hypertensive participants. < 130/85mm HG in hypertensive participants with diabetes, heart failure or chronic kidney disease.;Long Term: Maintenance of blood pressure at goal levels.    Lipids Yes    Intervention Provide education and support for participant on nutrition & aerobic/resistive  exercise along with prescribed medications to achieve LDL '70mg'$ , HDL >$Remo'40mg'PIEXN$ .    Expected Outcomes Short Term: Participant states understanding of desired cholesterol values and is compliant with medications prescribed. Participant is following exercise prescription and nutrition guidelines.;Long Term: Cholesterol controlled with medications as prescribed, with individualized exercise RX and with personalized nutrition plan. Value goals: LDL < $Rem'70mg'QTHN$ , HDL > 40 mg.             Education:Diabetes - Individual verbal and written instruction to review signs/symptoms of diabetes, desired ranges of glucose level fasting, after meals and with exercise. Acknowledge that pre and post exercise glucose checks will be done for 3 sessions at entry of program. [place] from 10/06/2020 in [facility] Cardiac and Pulmonary Rehab  Date 10/01/20  Educator [facility]  Instruction Review Code 1- Verbalizes Understanding       Core Components/Risk Factors/Patient Goals Review:    Core Components/Risk Factors/Patient Goals at Discharge (Final Review):    ITP Comments:  ITP Comments     Row Name 10/01/20 1329 10/06/20 1533 10/18/20 1415 10/20/20 0859     ITP Comments Initial telephone orientation completed. Diagnosis can be found in [facility] 4/19. EP orientation scheduled for Wednesday 6/1 at 9:30 Completed 6MWT and gym orientation. Initial ITP created and sent for review to Dr. Emily Filbert, Medical Director. First full day of exercise!  Patient was oriented to gym and equipment including functions, settings, policies, and procedures.  Patient's individual exercise prescription and treatment plan were reviewed.  All starting workloads were established based on the results of the 6 minute walk test done at initial orientation visit.  The plan for exercise progression was also introduced and progression will be customized based on patient's performance and goals. 30 Day review completed. Medical Director ITP review  done, changes made as directed, and signed approval by Medical Director.  new to program             Comments:

## 2020-10-22 ENCOUNTER — Encounter: Payer: 59 | Admitting: *Deleted

## 2020-10-22 ENCOUNTER — Other Ambulatory Visit: Payer: Self-pay

## 2020-10-22 DIAGNOSIS — Z951 Presence of aortocoronary bypass graft: Secondary | ICD-10-CM

## 2020-10-22 LAB — GLUCOSE, CAPILLARY
Glucose-Capillary: 227 mg/dL — ABNORMAL HIGH (ref 70–99)
Glucose-Capillary: 224 mg/dL — ABNORMAL HIGH (ref 70–99)

## 2020-10-22 NOTE — Progress Notes (Signed)
Daily Session Note  Patient Details  Name: Lisa [name] MRN: [id number] Date of Birth: 01/26/1961 Referring Provider:   Flowsheet Row Cardiac Rehab from 10/06/2020 in [facility] Cardiac and Pulmonary Rehab  Referring Provider [name] MD       Encounter Date: 10/22/2020  Check In:  Session Check In - 10/22/20 0944       Check-In   Supervising physician immediately available to respond to emergencies See telemetry face sheet for immediately available ER MD    Location [facility]-Cardiac & Pulmonary Rehab    Staff Present Renita Papa, RN BSN;Joseph Hood RCP,RRT,BSRT;Melissa Kinnelon RDN, LDN    Virtual Visit No    Medication changes reported     No    Fall or balance concerns reported    No    Warm-up and Cool-down Performed on first and last piece of equipment    Resistance Training Performed Yes    VAD Patient? No    PAD/SET Patient? No      Pain Assessment   Currently in Pain? No/denies                Social History   Tobacco Use  Smoking Status Never  Smokeless Tobacco Never    Goals Met:  Independence with exercise equipment Exercise tolerated well No report of cardiac concerns or symptoms Strength training completed today  Goals Unmet:  Not Applicable  Comments: Pt able to follow exercise prescription today without complaint.  Will continue to monitor for progression.    Dr. Emily Filbert is Medical Director for [place].  Dr. Ottie Glazier is Medical Director for [facility] Pulmonary Rehabilitation.

## 2020-10-25 ENCOUNTER — Other Ambulatory Visit: Payer: Self-pay

## 2020-10-25 ENCOUNTER — Encounter: Payer: 59 | Admitting: *Deleted

## 2020-10-25 DIAGNOSIS — Z951 Presence of aortocoronary bypass graft: Secondary | ICD-10-CM | POA: Diagnosis not present

## 2020-10-25 NOTE — Progress Notes (Signed)
Daily Session Note  Patient Details  Name: Lisa [name] MRN: [id number] Date of Birth: [birth year] Referring Provider:   Flowsheet Row Cardiac Rehab from 10/06/2020 in [facility] Cardiac and Pulmonary Rehab  Referring Provider [name] MD       Encounter Date: 10/25/2020  Check In:  Session Check In - 10/25/20 1036       Check-In   Supervising physician immediately available to respond to emergencies See telemetry face sheet for immediately available ER MD    Location [facility]-Cardiac & Pulmonary Rehab    Staff Present Renita Papa, RN BSN;Joseph [address] [place], [place], ACSM CEP, Exercise Physiologist    Virtual Visit No    Medication changes reported     No    Fall or balance concerns reported    No    Warm-up and Cool-down Performed on first and last piece of equipment    Resistance Training Performed Yes    VAD Patient? No    PAD/SET Patient? No      Pain Assessment   Currently in Pain? No/denies                Social History   Tobacco Use  Smoking Status Never  Smokeless Tobacco Never    Goals Met:  Independence with exercise equipment Exercise tolerated well No report of cardiac concerns or symptoms Strength training completed today  Goals Unmet:  Not Applicable  Comments: Pt able to follow exercise prescription today without complaint.  Will continue to monitor for progression.    Dr. Emily Filbert is Medical Director for [place].  Dr. Ottie Glazier is Medical Director for [facility] Pulmonary Rehabilitation.

## 2020-10-27 ENCOUNTER — Other Ambulatory Visit: Payer: Self-pay

## 2020-10-27 DIAGNOSIS — Z951 Presence of aortocoronary bypass graft: Secondary | ICD-10-CM | POA: Diagnosis not present

## 2020-10-27 NOTE — Progress Notes (Signed)
Daily Session Note  Patient Details  Name: Lisa [name] MRN: [id number] Date of Birth: 05-17-1960 Referring Provider:   Flowsheet Row Cardiac Rehab from 10/06/2020 in [facility] Cardiac and Pulmonary Rehab  Referring Provider [name] MD       Encounter Date: 10/27/2020  Check In:  Session Check In - 10/27/20 0956       Check-In   Supervising physician immediately available to respond to emergencies See telemetry face sheet for immediately available ER MD    Location [facility]-Cardiac & Pulmonary Rehab    Staff Present Birdie Sons, MPA, Elveria Rising, BA, ACSM CEP, Exercise Physiologist;Joseph Tessie Fass RCP,RRT,BSRT    Virtual Visit No    Medication changes reported     No    Fall or balance concerns reported    No    Warm-up and Cool-down Performed on first and last piece of equipment    Resistance Training Performed Yes    VAD Patient? No    PAD/SET Patient? No      Pain Assessment   Currently in Pain? No/denies                Social History   Tobacco Use  Smoking Status Never  Smokeless Tobacco Never    Goals Met:  Independence with exercise equipment Exercise tolerated well No report of cardiac concerns or symptoms Strength training completed today  Goals Unmet:  Not Applicable  Comments: Pt able to follow exercise prescription today without complaint.  Will continue to monitor for progression.    Dr. Emily Filbert is Medical Director for [place].  Dr. Ottie Glazier is Medical Director for [facility] Pulmonary Rehabilitation.

## 2020-11-01 ENCOUNTER — Encounter: Payer: 59 | Admitting: *Deleted

## 2020-11-01 ENCOUNTER — Other Ambulatory Visit: Payer: Self-pay

## 2020-11-01 DIAGNOSIS — Z951 Presence of aortocoronary bypass graft: Secondary | ICD-10-CM

## 2020-11-01 NOTE — Progress Notes (Signed)
Daily Session Note  Patient Details  Name: Lisa [name] MRN: [id number] Date of Birth: 12/13/1960 Referring Provider:   Flowsheet Row Cardiac Rehab from 10/06/2020 in [facility] Cardiac and Pulmonary Rehab  Referring Provider [name] MD       Encounter Date: 11/01/2020  Check In:  Session Check In - 11/01/20 1029       Check-In   Supervising physician immediately available to respond to emergencies See telemetry face sheet for immediately available ER MD    Location [facility]-Cardiac & Pulmonary Rehab    Staff Present Renita Papa, RN BSN;Joseph Hood RCP,RRT,BSRT;Amanda Oletta Darter, [place], ACSM CEP, Exercise Physiologist    Virtual Visit No    Medication changes reported     No    Fall or balance concerns reported    No    Warm-up and Cool-down Performed on first and last piece of equipment    Resistance Training Performed Yes    VAD Patient? No    PAD/SET Patient? No      Pain Assessment   Currently in Pain? No/denies                Social History   Tobacco Use  Smoking Status Never  Smokeless Tobacco Never    Goals Met:  Independence with exercise equipment Exercise tolerated well No report of cardiac concerns or symptoms Strength training completed today  Goals Unmet:  Not Applicable  Comments: Pt able to follow exercise prescription today without complaint.  Will continue to monitor for progression.    Dr. Emily Filbert is Medical Director for [place].  Dr. Ottie Glazier is Medical Director for [facility] Pulmonary Rehabilitation.

## 2020-11-03 ENCOUNTER — Other Ambulatory Visit: Payer: Self-pay

## 2020-11-03 DIAGNOSIS — Z951 Presence of aortocoronary bypass graft: Secondary | ICD-10-CM

## 2020-11-03 NOTE — Progress Notes (Signed)
Daily Session Note  Patient Details  Name: Lisa [name] MRN: [id number] Date of Birth: [birth year] Referring Provider:   Flowsheet Row Cardiac Rehab from 10/06/2020 in [facility] Cardiac and Pulmonary Rehab  Referring Provider [name] MD       Encounter Date: 11/03/2020  Check In:  Session Check In - 11/03/20 1008       Check-In   Supervising physician immediately available to respond to emergencies See telemetry face sheet for immediately available ER MD    Location [facility]-Cardiac & Pulmonary Rehab    Staff Present Birdie Sons, MPA, Nino Glow, MS, ASCM CEP, Exercise Physiologist;Melissa [place], RDN, LDN;Javante Nilsson, RN,BC,MSN;Joseph [place], [place]    Virtual Visit No    Medication changes reported     No    Fall or balance concerns reported    No    Warm-up and Cool-down Performed on first and last piece of equipment    Resistance Training Performed Yes    VAD Patient? No    PAD/SET Patient? No      Pain Assessment   Currently in Pain? No/denies                Social History   Tobacco Use  Smoking Status Never  Smokeless Tobacco Never    Goals Met:  Independence with exercise equipment Exercise tolerated well No report of cardiac concerns or symptoms Strength training completed today  Goals Unmet:  Not Applicable  Comments: Pt able to follow exercise prescription today without complaint.  Will continue to monitor for progression.   Dr. Emily Filbert is Medical Director for [place].  Dr. Ottie Glazier is Medical Director for [facility] Pulmonary Rehabilitation.

## 2020-11-04 ENCOUNTER — Other Ambulatory Visit: Payer: Self-pay

## 2020-11-04 DIAGNOSIS — Z951 Presence of aortocoronary bypass graft: Secondary | ICD-10-CM

## 2020-11-04 NOTE — Progress Notes (Signed)
Completed initial RD evaluation 

## 2020-11-05 ENCOUNTER — Encounter: Payer: 59 | Attending: Internal Medicine | Admitting: *Deleted

## 2020-11-05 ENCOUNTER — Other Ambulatory Visit: Payer: Self-pay

## 2020-11-05 DIAGNOSIS — I2510 Atherosclerotic heart disease of native coronary artery without angina pectoris: Secondary | ICD-10-CM | POA: Insufficient documentation

## 2020-11-05 DIAGNOSIS — Z951 Presence of aortocoronary bypass graft: Secondary | ICD-10-CM | POA: Insufficient documentation

## 2020-11-05 NOTE — Progress Notes (Signed)
Daily Session Note  Patient Details  Name: Lisa [name] MRN: [id number] Date of Birth: [birth year] Referring Provider:   Flowsheet [name] Cardiac Rehab from 10/06/2020 in [facility] Cardiac and Pulmonary Rehab  Referring Provider Lisa Amel MD       Encounter Date: 11/05/2020  Check In:  Session Check In - 11/05/20 0944       Check-In   Supervising physician immediately available to respond to emergencies See telemetry face sheet for immediately available ER MD    Location [facility]-Cardiac & Pulmonary Rehab    Staff Present Lisa Papa, RN BSN;Lisa [name], RCP,RRT,BSRT;Lisa [name], [place], RCEP, CCRP, CCET    Virtual Visit No    Medication changes reported     No    Fall or balance concerns reported    No    Warm-up and Cool-down Performed on first and last piece of equipment    Resistance Training Performed Yes    VAD Patient? No    PAD/SET Patient? No      Pain Assessment   Currently in Pain? No/denies                Social History   Tobacco Use  Smoking Status Never  Smokeless Tobacco Never    Goals Met:  Independence with exercise equipment Exercise tolerated well No report of cardiac concerns or symptoms Strength training completed today  Goals Unmet:  Not Applicable  Comments: Pt able to follow exercise prescription today without complaint.  Will continue to monitor for progression.    Dr. Emily [name] is Medical Director for [place].  Dr. Ottie [name] is Medical Director for [facility] Pulmonary Rehabilitation.

## 2020-11-10 ENCOUNTER — Other Ambulatory Visit: Payer: Self-pay

## 2020-11-10 DIAGNOSIS — Z951 Presence of aortocoronary bypass graft: Secondary | ICD-10-CM | POA: Diagnosis not present

## 2020-11-10 NOTE — Progress Notes (Signed)
Daily Session Note  Patient Details  Name: Lisa [name] MRN: [id number] Date of Birth: 08-01-1960 Referring Provider:   Flowsheet Row Cardiac Rehab from 10/06/2020 in [facility] Cardiac and Pulmonary Rehab  Referring Provider [name] MD       Encounter Date: 11/10/2020  Check In:  Session Check In - 11/10/20 0943       Check-In   Supervising physician immediately available to respond to emergencies See telemetry face sheet for immediately available ER MD    Location [facility]-Cardiac & Pulmonary Rehab    Staff Present Birdie Sons, MPA, RN;Laureen Owens Shark, BS, RRT, CPFT;Joseph [place], [place]    Virtual Visit No    Medication changes reported     No    Fall or balance concerns reported    No    Warm-up and Cool-down Performed on first and last piece of equipment    Resistance Training Performed Yes    VAD Patient? No    PAD/SET Patient? No      Pain Assessment   Currently in Pain? No/denies                Social History   Tobacco Use  Smoking Status Never  Smokeless Tobacco Never    Goals Met:  Independence with exercise equipment Exercise tolerated well No report of cardiac concerns or symptoms Strength training completed today  Goals Unmet:  Not Applicable  Comments: Pt able to follow exercise prescription today without complaint.  Will continue to monitor for progression.    Dr. Emily Filbert is Medical Director for [place].  Dr. Ottie Glazier is Medical Director for [facility] Pulmonary Rehabilitation.

## 2020-11-12 ENCOUNTER — Other Ambulatory Visit: Payer: Self-pay

## 2020-11-12 ENCOUNTER — Encounter: Payer: 59 | Admitting: *Deleted

## 2020-11-12 DIAGNOSIS — Z951 Presence of aortocoronary bypass graft: Secondary | ICD-10-CM | POA: Diagnosis not present

## 2020-11-12 NOTE — Progress Notes (Signed)
Daily Session Note  Patient Details  Name: Lisa [name] MRN: [id number] Date of Birth: April 19, 1961 Referring Provider:   Flowsheet Row Cardiac Rehab from 10/06/2020 in [facility] Cardiac and Pulmonary Rehab  Referring Provider [name] MD       Encounter Date: 11/12/2020  Check In:  Session Check In - 11/12/20 0940       Check-In   Supervising physician immediately available to respond to emergencies See telemetry face sheet for immediately available ER MD    Location [facility]-Cardiac & Pulmonary Rehab    Staff Present Renita Papa, RN BSN;Melissa Tilford Pillar, RDN, LDN;Jessica Luan [place], MA, RCEP, CCRP, CCET    Virtual Visit No    Medication changes reported     No    Fall or balance concerns reported    No    Warm-up and Cool-down Performed on first and last piece of equipment    Resistance Training Performed Yes    VAD Patient? No    PAD/SET Patient? No      Pain Assessment   Currently in Pain? No/denies                Social History   Tobacco Use  Smoking Status Never  Smokeless Tobacco Never    Goals Met:  Independence with exercise equipment Exercise tolerated well No report of cardiac concerns or symptoms Strength training completed today  Goals Unmet:  Not Applicable  Comments: Pt able to follow exercise prescription today without complaint.  Will continue to monitor for progression.    Dr. Emily Filbert is Medical Director for [place].  Dr. Ottie Glazier is Medical Director for [facility] Pulmonary Rehabilitation.

## 2020-11-15 ENCOUNTER — Other Ambulatory Visit: Payer: Self-pay

## 2020-11-15 ENCOUNTER — Encounter: Payer: 59 | Admitting: *Deleted

## 2020-11-15 DIAGNOSIS — Z951 Presence of aortocoronary bypass graft: Secondary | ICD-10-CM | POA: Diagnosis not present

## 2020-11-15 NOTE — Progress Notes (Signed)
Daily Session Note  Patient Details  Name: Lisa [name] MRN: [id number] Date of Birth: 1961/02/10 Referring Provider:   Flowsheet Row Cardiac Rehab from 10/06/2020 in [facility] Cardiac and Pulmonary Rehab  Referring Provider [name] MD       Encounter Date: 11/15/2020  Check In:  Session Check In - 11/15/20 1035       Check-In   Supervising physician immediately available to respond to emergencies See telemetry face sheet for immediately available ER MD    Location [facility]-Cardiac & Pulmonary Rehab    Staff Present Heath Lark, RN, BSN, CCRP;Kristen Coble, RN,BC,MSN;Kelly Dora, BS, ACSM CEP, Exercise Physiologist    Virtual Visit No    Medication changes reported     No    Fall or balance concerns reported    No    Warm-up and Cool-down Performed on first and last piece of equipment    Resistance Training Performed Yes    VAD Patient? No    PAD/SET Patient? No      Pain Assessment   Currently in Pain? No/denies                Social History   Tobacco Use  Smoking Status Never  Smokeless Tobacco Never    Goals Met:  Independence with exercise equipment Exercise tolerated well No report of cardiac concerns or symptoms  Goals Unmet:  Not Applicable  Comments: Pt able to follow exercise prescription today without complaint.  Will continue to monitor for progression.    Dr. Emily Filbert is Medical Director for [place].  Dr. Ottie Glazier is Medical Director for [facility] Pulmonary Rehabilitation.

## 2020-11-17 ENCOUNTER — Other Ambulatory Visit: Payer: Self-pay

## 2020-11-17 ENCOUNTER — Encounter: Payer: Self-pay | Admitting: *Deleted

## 2020-11-17 ENCOUNTER — Encounter: Payer: 59 | Admitting: *Deleted

## 2020-11-17 DIAGNOSIS — Z951 Presence of aortocoronary bypass graft: Secondary | ICD-10-CM

## 2020-11-17 NOTE — Progress Notes (Signed)
Daily Session Note  Patient Details  Name: Lisa [name] MRN: [id number] Date of Birth: 1961/04/16 Referring Provider:   Flowsheet Row Cardiac Rehab from 10/06/2020 in [facility] Cardiac and Pulmonary Rehab  Referring Provider [name] MD       Encounter Date: 11/17/2020  Check In:  Session Check In - 11/17/20 1026       Check-In   Supervising physician immediately available to respond to emergencies See telemetry face sheet for immediately available ER MD    Location [facility]-Cardiac & Pulmonary Rehab    Staff Present Heath Lark, RN, BSN, CCRP;Melissa Smolan, RDN, LDN;Meredith Sherryll Burger, RN Sherryl Barters, MPA, RN    Virtual Visit No    Medication changes reported     No    Fall or balance concerns reported    No    Warm-up and Cool-down Performed on first and last piece of equipment    Resistance Training Performed Yes    VAD Patient? No    PAD/SET Patient? No      Pain Assessment   Currently in Pain? No/denies                Social History   Tobacco Use  Smoking Status Never  Smokeless Tobacco Never    Goals Met:  Independence with exercise equipment Exercise tolerated well No report of cardiac concerns or symptoms  Goals Unmet:  Not Applicable  Comments: Pt able to follow exercise prescription today without complaint.  Will continue to monitor for progression.    Dr. Emily Filbert is Medical Director for [place].  Dr. Ottie Glazier is Medical Director for [facility] Pulmonary Rehabilitation.

## 2020-11-17 NOTE — Progress Notes (Signed)
Cardiac Individual Treatment Plan  Patient Details  Name: Makalah Asberry MRN: [id number] Date of Birth: 05-Apr-1961 Referring Provider:   Flowsheet Row Cardiac Rehab from 10/06/2020 in [facility] Cardiac and Pulmonary Rehab  Referring Provider [name] MD       Initial Encounter Date:  Flowsheet Row Cardiac Rehab from 10/06/2020 in [facility] Cardiac and Pulmonary Rehab  Date 10/06/20       Visit Diagnosis: S/P CABG x 3  Patient's Home Medications on Admission:  Current Outpatient Medications:    aspirin 81 MG tablet, Take 1 tablet by mouth daily., Disp: , Rfl:    atorvastatin (LIPITOR) 80 MG tablet, Take 1 tablet (80 mg total) by mouth daily at 6 PM., Disp: 90 tablet, Rfl: 3   Blood Glucose Monitoring Suppl (FIFTY50 GLUCOSE METER 2.0) W/DEVICE KIT, Use as directed., Disp: , Rfl:    clopidogrel (PLAVIX) 75 MG tablet, Take 75 mg by mouth daily. (Patient not taking: Reported on 08/11/2020), Disp: , Rfl:    glyBURIDE (DIABETA) 2.5 MG tablet, Take 1 tablet by mouth daily. (Patient not taking: Reported on 08/11/2020), Disp: , Rfl:    insulin glargine (LANTUS) 100 UNIT/ML injection, Inject 50 Units into the skin daily., Disp: , Rfl:    JARDIANCE 25 MG TABS tablet, Take 25 mg daily by mouth. (Patient not taking: Reported on 08/11/2020), Disp: , Rfl: 6   lisinopril (ZESTRIL) 5 MG tablet, Take 5 mg by mouth daily., Disp: , Rfl:    metoprolol succinate (TOPROL-XL) 25 MG 24 hr tablet, Take 25 mg by mouth daily., Disp: , Rfl:    metoprolol tartrate (LOPRESSOR) 25 MG tablet, Take by mouth., Disp: , Rfl:    MULTIPLE VITAMINS-MINERALS PO, Take 1 tablet by mouth daily., Disp: , Rfl:    omeprazole (PRILOSEC) 40 MG capsule, TAKE 1 CAPSULE BY MOUTH  DAILY, Disp: 90 capsule, Rfl: 3   ondansetron (ZOFRAN) 4 MG tablet, Take 1 tablet (4 mg total) by mouth every 8 (eight) hours as needed for nausea or vomiting. (Patient not taking: Reported on 07/29/2020), Disp: 30 tablet, Rfl: 0   sulfamethoxazole-trimethoprim  (BACTRIM DS) 800-160 MG tablet, Take 1 tablet by mouth 2 (two) times daily. (Patient not taking: Reported on 08/11/2020), Disp: 6 tablet, Rfl: 0   terconazole (TERAZOL 7) 0.4 % vaginal cream, Place 1 applicator vaginally at bedtime., Disp: 45 g, Rfl: 0   ursodiol (ACTIGALL) 300 MG capsule, Take 300 mg by mouth daily. , Disp: , Rfl:   Past Medical History: Past Medical History:  Diagnosis Date   Allergic rhinitis 02/15/2015   Arthritis    BP (high blood pressure) 02/15/2015   Cervical lymphadenopathy    Cirrhosis ([place])    Diabetes mellitus    Elevated liver enzymes 09/23/2015   Elevated liver enzymes 09/23/2015   GERD (gastroesophageal reflux disease)    Gonalgia 02/15/2015   Hernia, lateral ventral 11/19/2012   Hypertension    Intra-abdominal and pelvic swelling, mass and lump, unspecified site 11/19/2012   Lymphadenopathy 03/18/2017    Tobacco Use: Social History   Tobacco Use  Smoking Status Never  Smokeless Tobacco Never    Labs: Recent Review Flowsheet Data     Labs for ITP Cardiac and Pulmonary Rehab Latest Ref Rng & Units 09/22/2015 05/22/2017 12/26/2017 11/05/2018 06/04/2019   Cholestrol 0 - 200 305(H) - 359(H) 355(A) -   LDLCALC - 205(H) - 211(H) 207 -   HDL 35 - 70 84 - 127 133(A) -   Trlycerides 40 - 160 82 -  106 74 -   Hemoglobin A1c - - 8.3 12.1(H) - 10.5        Exercise Target Goals: Exercise Program Goal: Individual exercise prescription set using results from initial 6 min walk test and THRR while considering  patient's activity barriers and safety.   Exercise Prescription Goal: Initial exercise prescription builds to 30-45 minutes a day of aerobic activity, 2-3 days per week.  Home exercise guidelines will be given to patient during program as part of exercise prescription that the participant will acknowledge.   Education: Aerobic Exercise: - Group verbal and visual presentation on the components of exercise prescription. Introduces F.I.T.T principle from  ACSM for exercise prescriptions.  Reviews F.I.T.T. principles of aerobic exercise including progression. Written material given at graduation. Flowsheet Row Cardiac Rehab from 11/10/2020 in [facility] Cardiac and Pulmonary Rehab  Date 10/27/20  Educator [facility]  Instruction Review Code 1- Verbalizes Understanding       Education: Resistance Exercise: - Group verbal and visual presentation on the components of exercise prescription. Introduces F.I.T.T principle from ACSM for exercise prescriptions  Reviews F.I.T.T. principles of resistance exercise including progression. Written material given at graduation. Flowsheet Row Cardiac Rehab from 11/10/2020 in [facility] Cardiac and Pulmonary Rehab  Date 11/03/20  Educator [facility]  Instruction Review Code 1- Verbalizes Understanding        Education: Exercise & Equipment Safety: - Individual verbal instruction and demonstration of equipment use and safety with use of the equipment. Flowsheet Row Cardiac Rehab from 11/10/2020 in [facility] Cardiac and Pulmonary Rehab  Date 10/06/20  Educator [facility]  Instruction Review Code 1- Verbalizes Understanding       Education: Exercise Physiology & General Exercise Guidelines: - Group verbal and written instruction with models to review the exercise physiology of the cardiovascular system and associated critical values. Provides general exercise guidelines with specific guidelines to those with heart or lung disease.    Education: Flexibility, Balance, Mind/Body Relaxation: - Group verbal and visual presentation with interactive activity on the components of exercise prescription. Introduces F.I.T.T principle from ACSM for exercise prescriptions. Reviews F.I.T.T. principles of flexibility and balance exercise training including progression. Also discusses the mind body connection.  Reviews various relaxation techniques to help reduce and manage stress (i.e. Deep breathing, progressive muscle relaxation, and visualization). Balance  handout provided to take home. Written material given at graduation. Flowsheet Row Cardiac Rehab from 11/10/2020 in [facility] Cardiac and Pulmonary Rehab  Date 11/10/20  Educator [facility]  Instruction Review Code 1- Verbalizes Understanding       Activity Barriers & Risk Stratification:  Activity Barriers & Cardiac Risk Stratification - 10/06/20 1534       Activity Barriers & Cardiac Risk Stratification   Activity Barriers Incisional Pain;Left Knee Replacement;Muscular Weakness;Shortness of Breath;Deconditioning;Balance Concerns    Cardiac Risk Stratification High             6 Minute Walk:  6 Minute Walk     Row Name 10/06/20 1533         6 Minute Walk   Phase Initial     Distance 890 feet     Walk Time 6 minutes     # of Rest Breaks 0     MPH 1.68     METS 2.76     RPE 11     VO2 Peak 9.67     Symptoms No     Resting HR 87 bpm     Resting BP 128/62     Resting Oxygen Saturation  98 %     Exercise Oxygen Saturation  during 6 min walk 94 %     Max Ex. HR 98 bpm     Max Ex. BP 134/72     2 Minute Post BP 126/64              Oxygen Initial Assessment:   Oxygen Re-Evaluation:   Oxygen Discharge (Final Oxygen Re-Evaluation):   Initial Exercise Prescription:  Initial Exercise Prescription - 10/06/20 1500       Date of Initial Exercise RX and Referring Provider   Date 10/06/20    Referring Provider [name] MD      Treadmill   MPH 1.6    Grade 0.5    Minutes 15    METs 2.32      Recumbant Bike   Level 1    RPM 50    Watts 22    Minutes 15    METs 2      REL-XR   Level 1    Speed 50    Minutes 15    METs 2      Prescription Details   Frequency (times per week) 3    Duration Progress to 30 minutes of continuous aerobic without signs/symptoms of physical distress      Intensity   THRR 40-80% of Max Heartrate 117-146    Ratings of Perceived Exertion 11-13    Perceived Dyspnea 0-4      Progression   Progression Continue to progress  workloads to maintain intensity without signs/symptoms of physical distress.      Resistance Training   Training Prescription Yes    Weight 3 lb    Reps 10-15             Perform Capillary Blood Glucose checks as needed.  Exercise Prescription Changes:   Exercise Prescription Changes     Row Name 10/06/20 1500 10/20/20 0900 11/01/20 1400 11/16/20 1500       Response to Exercise   Blood Pressure (Admit) 128/62 122/70 110/68 110/80    Blood Pressure (Exercise) 134/72 146/78 162/80 118/68    Blood Pressure (Exit) 126/64 112/68 112/62 110/80    Heart Rate (Admit) 87 bpm 79 bpm 60 bpm 84 bpm    Heart Rate (Exercise) 98 bpm 101 bpm 110 bpm 107 bpm    Heart Rate (Exit) 88 bpm 89 bpm 77 bpm 88 bpm    Oxygen Saturation (Admit) 98 % -- -- --    Oxygen Saturation (Exercise) 94 % -- -- --    Rating of Perceived Exertion (Exercise) _0 Symptoms none none none none    Comments walk test results first day -- --    Duration -- -- Continue with 30 min of aerobic exercise without signs/symptoms of physical distress. Continue with 30 min of aerobic exercise without signs/symptoms of physical distress.    Intensity -- -- THRR unchanged THRR unchanged         Progression        Progression -- -- Continue to progress workloads to maintain intensity without signs/symptoms of physical distress. Continue to progress workloads to maintain intensity without signs/symptoms of physical distress.    Average METs -- -- 2.7 2.77         Resistance Training        Training Prescription -- Yes Yes Yes    Weight -- 3 lb 3 lb 3 lb    Reps -- 10-15 10-15 10-15  Interval Training        Interval Training -- -- No No         Treadmill        MPH -- -- -- 2.5    Grade -- -- -- 1    Minutes -- -- -- 15    METs -- -- -- 3.26         Recumbant Bike        Level -- -- 3 3    Watts -- -- 23 25    Minutes -- -- 15 15    METs -- -- 2.87 --         REL-XR        Level -- _0 Minutes -- _1 METs -- 2 2.6 2.7         Track        Laps -- -- -- 25    Minutes -- -- -- 15    METs -- -- -- 2.36            Exercise Comments:   Exercise Comments     Row Name 10/18/20 1416           Exercise Comments First full day of exercise!  Patient was oriented to gym and equipment including functions, settings, policies, and procedures.  Patient's individual exercise prescription and treatment plan were reviewed.  All starting workloads were established based on the results of the 6 minute walk test done at initial orientation visit.  The plan for exercise progression was also introduced and progression will be customized based on patient's performance and goals.                Exercise Goals and Review:   Exercise Goals     Row Name 10/06/20 1536             Exercise Goals   Increase Physical Activity Yes       Intervention Provide advice, education, support and counseling about physical activity/exercise needs.;Develop an individualized exercise prescription for aerobic and resistive training based on initial evaluation findings, risk stratification, comorbidities and participant's personal goals.       Expected Outcomes Short Term: Attend rehab on a regular basis to increase amount of physical activity.;Long Term: Exercising regularly at least 3-5 days a week.;Long Term: Add in home exercise to make exercise part of routine and to increase amount of physical activity.       Increase Strength and Stamina Yes       Intervention Provide advice, education, support and counseling about physical activity/exercise needs.;Develop an individualized exercise prescription for aerobic and resistive training based on initial evaluation findings, risk stratification, comorbidities and participant's personal goals.       Expected Outcomes Short Term: Increase workloads from initial exercise prescription for resistance, speed, and METs.;Short Term: Perform  resistance training exercises routinely during rehab and add in resistance training at home;Long Term: Improve cardiorespiratory fitness, muscular endurance and strength as measured by increased METs and functional capacity (6MWT)       Able to understand and use rate of perceived exertion (RPE) scale Yes       Intervention Provide education and explanation on how to use RPE scale       Expected Outcomes Short Term: Able to use RPE daily in rehab to express subjective intensity level;Long Term:  Able to use RPE to guide intensity level when exercising independently  Able to understand and use Dyspnea scale Yes       Intervention Provide education and explanation on how to use Dyspnea scale       Expected Outcomes Long Term: Able to use Dyspnea scale to guide intensity level when exercising independently;Short Term: Able to use Dyspnea scale daily in rehab to express subjective sense of shortness of breath during exertion       Knowledge and understanding of Target Heart Rate Range (THRR) Yes       Intervention Provide education and explanation of THRR including how the numbers were predicted and where they are located for reference       Expected Outcomes Short Term: Able to state/look up THRR;Short Term: Able to use daily as guideline for intensity in rehab;Long Term: Able to use THRR to govern intensity when exercising independently       Able to check pulse independently Yes       Intervention Provide education and demonstration on how to check pulse in carotid and radial arteries.;Review the importance of being able to check your own pulse for safety during independent exercise       Expected Outcomes Short Term: Able to explain why pulse checking is important during independent exercise;Long Term: Able to check pulse independently and accurately       Understanding of Exercise Prescription Yes       Intervention Provide education, explanation, and written materials on patient's individual  exercise prescription       Expected Outcomes Short Term: Able to explain program exercise prescription;Long Term: Able to explain home exercise prescription to exercise independently                Exercise Goals Re-Evaluation :  Exercise Goals Re-Evaluation     Row Name 10/18/20 1416 11/01/20 1452 11/16/20 1535         Exercise Goal Re-Evaluation   Exercise Goals Review Able to understand and use rate of perceived exertion (RPE) scale;Able to understand and use Dyspnea scale;Knowledge and understanding of Target Heart Rate Range (THRR);Understanding of Exercise Prescription Increase Physical Activity;Increase Strength and Stamina;Understanding of Exercise Prescription Increase Physical Activity;Increase Strength and Stamina;Understanding of Exercise Prescription     Comments Reviewed RPE and dyspnea scales, THR and program prescription with pt today.  Pt voiced understanding and was given a copy of goals to take home. Zoey is doing well in rehab.  She is up to 30 laps on the track now.  She is also doing 23 watts on the bike.  We will continue to monitor her progress. Nirel is continuing to do well. She has increased to a speed of 2.5 and 1% incline on the treadmill but is still maintaining the same number of laps on the track. Will continue to monitor.     Expected Outcomes Short: Use RPE daily to regulate intensity. Long: Follow program prescription in THR. Short: Continue to attend rehab regularly Long: Conitnue to follow program prescription Short: Increase number of laps on track Long: Continue to increase strength and stamina              Discharge Exercise Prescription (Final Exercise Prescription Changes):  Exercise Prescription Changes - 11/16/20 1500       Response to Exercise   Blood Pressure (Admit) 110/80    Blood Pressure (Exercise) 118/68    Blood Pressure (Exit) 110/80    Heart Rate (Admit) 84 bpm    Heart Rate (Exercise) 107 bpm    Heart Rate (Exit)  88 bpm     Rating of Perceived Exertion (Exercise) 17    Symptoms none    Duration Continue with 30 min of aerobic exercise without signs/symptoms of physical distress.    Intensity THRR unchanged      Progression   Progression Continue to progress workloads to maintain intensity without signs/symptoms of physical distress.    Average METs 2.77      Resistance Training   Training Prescription Yes    Weight 3 lb    Reps 10-15      Interval Training   Interval Training No      Treadmill   MPH 2.5    Grade 1    Minutes 15    METs 3.26      Recumbant Bike   Level 3    Watts 25    Minutes 15      REL-XR   Level 1    Minutes 15    METs 2.7      Track   Laps 25    Minutes 15    METs 2.36             Nutrition:  Target Goals: Understanding of nutrition guidelines, daily intake of sodium '1500mg'$ , cholesterol '200mg'$ , calories 30% from fat and 7% or less from saturated fats, daily to have 5 or more servings of fruits and vegetables.  Education: All About Nutrition: -Group instruction provided by verbal, written material, interactive activities, discussions, models, and posters to present general guidelines for heart healthy nutrition including fat, fiber, MyPlate, the role of sodium in heart healthy nutrition, utilization of the nutrition label, and utilization of this knowledge for meal planning. Follow up email sent as well. Written material given at graduation. Flowsheet Row Cardiac Rehab from 11/10/2020 in [facility] Cardiac and Pulmonary Rehab  Education need identified 10/06/20       Biometrics:  Pre Biometrics - 10/06/20 1537       Pre Biometrics   Height $Remov'5\' 10"'oPgeFx$  (1.778 m)    Weight 199 lb 9.6 oz (90.5 kg)    BMI (Calculated) 28.64    Single Leg Stand 7.1 seconds              Nutrition Therapy Plan and Nutrition Goals:  Nutrition Therapy & Goals - 11/04/20 0851       Nutrition Therapy   Diet Heart healthy, low Na, diabetes friendly    Drug/Food Interactions  Statins/Certain Fruits    Protein (specify units) 70g    Fiber 25 grams    Whole Grain Foods 3 servings    Saturated Fats 12 max. grams    Fruits and Vegetables 8 servings/day    Sodium 1.5 grams      Personal Nutrition Goals   Nutrition Goal ST: add protein/fat to fruit at 3am 3rd shift snack: nuts/seeds, greek yogurt, a boiled egg or cubes of cheese  LT: Use MyPlate structure when eating meals and continue to enjoy the meals she is used to    Comments PYP score was 83. She reports trying hard to eat well and is now boiling plaintains instead of frying. She is from the [place]; her curry chicken and gravy is low in saturated fat, if lowered salt in recipe would be considered heart healthy - she was nervous she would no longer be able to eat that anymore. She reports loving her rice and collards. Discussed MyPlate structure for meal planning as well as heart healthy and diabetes frienly eating. She works 3rd  shift and has a CHO snack of fruit at 3am, but feels like its hard to hold out until breakfast, discussed adding protein and fat like nuts/seeds, greek yogurt, a boiled egg or cubes of cheese. She would like to try this out.      Intervention Plan   Intervention Prescribe, educate and counsel regarding individualized specific dietary modifications aiming towards targeted core components such as weight, hypertension, lipid management, diabetes, heart failure and other comorbidities.    Expected Outcomes Short Term Goal: Understand basic principles of dietary content, such as calories, fat, sodium, cholesterol and nutrients.;Short Term Goal: A plan has been developed with personal nutrition goals set during dietitian appointment.;Long Term Goal: Adherence to prescribed nutrition plan.             Nutrition Assessments:  MEDIFICTS Score Key: ?70 Need to make dietary changes  40-70 Heart Healthy Diet ? 40 Therapeutic Level Cholesterol Diet  Flowsheet Row Cardiac Rehab from  10/06/2020 in [facility] Cardiac and Pulmonary Rehab  Picture Your Plate Total Score on Admission 83      Picture Your Plate Scores: <28 Unhealthy dietary pattern with much room for improvement. 41-50 Dietary pattern unlikely to meet recommendations for good health and room for improvement. 51-60 More healthful dietary pattern, with some room for improvement.  >60 Healthy dietary pattern, although there may be some specific behaviors that could be improved.    Nutrition Goals Re-Evaluation:   Nutrition Goals Discharge (Final Nutrition Goals Re-Evaluation):   Psychosocial: Target Goals: Acknowledge presence or absence of significant depression and/or stress, maximize coping skills, provide positive support system. Participant is able to verbalize types and ability to use techniques and skills needed for reducing stress and depression.   Education: Stress, Anxiety, and Depression - Group verbal and visual presentation to define topics covered.  Reviews how body is impacted by stress, anxiety, and depression.  Also discusses healthy ways to reduce stress and to treat/manage anxiety and depression.  Written material given at graduation.   Education: Sleep Hygiene -Provides group verbal and written instruction about how sleep can affect your health.  Define sleep hygiene, discuss sleep cycles and impact of sleep habits. Review good sleep hygiene tips.    Initial Review & Psychosocial Screening:  Initial Psych Review & Screening - 10/01/20 1307       Initial Review   Current issues with Current Stress Concerns    Source of Stress Concerns Unable to perform yard/household activities;Unable to participate in former interests or hobbies;Family;Occupation      Family Dynamics   [place]? No   some local family   Strains Intra-family strains    Comments husband is not supportive; she states she has learned she can only count on herself and not other people      Barriers    Psychosocial barriers to participate in program There are no identifiable barriers or psychosocial needs.      Screening Interventions   Interventions Encouraged to exercise;To provide support and resources with identified psychosocial needs;Provide feedback about the scores to participant    Expected Outcomes Short Term goal: Utilizing psychosocial counselor, staff and physician to assist with identification of specific Stressors or current issues interfering with healing process. Setting desired goal for each stressor or current issue identified.;Long Term Goal: Stressors or current issues are controlled or eliminated.;Short Term goal: Identification and review with participant of any Quality of Life or Depression concerns found by scoring the questionnaire.;Long Term goal: The participant improves quality of  Life and PHQ9 Scores as seen by post scores and/or verbalization of changes             Quality of Life Scores:   Quality of Life - 10/06/20 1537       Quality of Life   Select Quality of Life      Quality of Life Scores   Health/Function Pre 26.53 %    Socioeconomic Pre 29.25 %    Psych/Spiritual Pre 30 %    Family Pre 25.9 %    GLOBAL Pre 27.89 %            Scores of 19 and below usually indicate a poorer quality of life in these areas.  A difference of  2-3 points is a clinically meaningful difference.  A difference of 2-3 points in the total score of the Quality of Life Index has been associated with significant improvement in overall quality of life, self-image, physical symptoms, and general health in studies assessing change in quality of life.  PHQ-9: Recent Review Flowsheet Data     Depression screen [facility] 2/9 10/06/2020 07/18/2019 12/24/2017 09/20/2015   Decreased Interest 0 1 0 0   Down, Depressed, Hopeless 0 0 0 0   PHQ - 2 Score 0 1 0 0   Altered sleeping 0 0 0 -   Tired, decreased energy 1 2 0 -   Change in appetite 3 0 0 -   Feeling bad or failure about  yourself  0 0 0 -   Trouble concentrating 0 0 0 -   Moving slowly or fidgety/restless 0 0 0 -   Suicidal thoughts 0 0 0 -   PHQ-9 Score 4 3 0 -   Difficult doing work/chores Not difficult at all Not difficult at all Not difficult at all -      Interpretation of Total Score  Total Score Depression Severity:  1-4 = Minimal depression, 5-9 = Mild depression, 10-14 = Moderate depression, 15-19 = Moderately severe depression, 20-27 = Severe depression   Psychosocial Evaluation and Intervention:  Psychosocial Evaluation - 10/01/20 1323       Psychosocial Evaluation & Interventions   Interventions Encouraged to exercise with the program and follow exercise prescription;Stress management education;Relaxation education    Comments Fancy is referred to cardiac rehab post CABG x 3. She states she is feeling a little better each day and is ready to start the program so she can learn what she should be doing and how to eat and manage a heart healthy lifestyle. She is out of work for now, but plans on returning to [employer] when released where she was working night shift and a lot overtime. She is thankful that her supervisor has had a CABG in the past so she is being very supportive. Murphy states that she and her husband are very disconnected, she does not feel supported at all by him. She prides herself in being a strong, indpendent woman and knows she has two feet to stand on financially and mentally if they were to separate. Her main focus right now is gaining her stamina back so she can live her life the way she wants to.    Expected Outcomes Short: attend cardiac rehab for education and exercise. Long: develop positive self care habits.    Continue Psychosocial Services  Follow up required by staff             Psychosocial Re-Evaluation:  Psychosocial Re-Evaluation     Row Name  11/05/20 1021             Psychosocial Re-Evaluation   Current issues with Current Stress Concerns        Comments Her husband has been more supportive since her journey for better health. She wants to not rely on anybody but knows that she needs help sometimes. Her husband has been proud of the progress that she has made in the program.       Expected Outcomes Short: Continue to exercise regularly to support mental health and notify staff of any changes. Long: maintain mental health and well being through teaching of rehab or prescribed medications independently.       Interventions Encouraged to attend Cardiac Rehabilitation for the exercise       Continue Psychosocial Services  Follow up required by staff                Psychosocial Discharge (Final Psychosocial Re-Evaluation):  Psychosocial Re-Evaluation - 11/05/20 1021       Psychosocial Re-Evaluation   Current issues with Current Stress Concerns    Comments Her husband has been more supportive since her journey for better health. She wants to not rely on anybody but knows that she needs help sometimes. Her husband has been proud of the progress that she has made in the program.    Expected Outcomes Short: Continue to exercise regularly to support mental health and notify staff of any changes. Long: maintain mental health and well being through teaching of rehab or prescribed medications independently.    Interventions Encouraged to attend Cardiac Rehabilitation for the exercise    Continue Psychosocial Services  Follow up required by staff             Vocational Rehabilitation: Provide vocational rehab assistance to qualifying candidates.   Vocational Rehab Evaluation & Intervention:  Vocational Rehab - 10/01/20 1307       Initial Vocational Rehab Evaluation & Intervention   Assessment shows need for Vocational Rehabilitation No             Education: Education Goals: Education classes will be provided on a variety of topics geared toward better understanding of heart health and risk factor modification. Participant  will state understanding/return demonstration of topics presented as noted by education test scores.  Learning Barriers/Preferences:  Learning Barriers/Preferences - 10/01/20 1306       Learning Barriers/Preferences   Learning Barriers None    Learning Preferences None             General Cardiac Education Topics:  AED/CPR: - Group verbal and written instruction with the use of models to demonstrate the basic use of the AED with the basic ABC's of resuscitation.   Anatomy and Cardiac Procedures: - Group verbal and visual presentation and models provide information about basic cardiac anatomy and function. Reviews the testing methods done to diagnose heart disease and the outcomes of the test results. Describes the treatment choices: Medical Management, Angioplasty, or Coronary Bypass Surgery for treating various heart conditions including Myocardial Infarction, Angina, Valve Disease, and Cardiac Arrhythmias.  Written material given at graduation. Flowsheet Row Cardiac Rehab from 11/10/2020 in [facility] Cardiac and Pulmonary Rehab  Date 11/03/20  Educator SB  Instruction Review Code 1- Verbalizes Understanding       Medication Safety: - Group verbal and visual instruction to review commonly prescribed medications for heart and lung disease. Reviews the medication, class of the drug, and side effects. Includes the steps to properly store meds and  maintain the prescription regimen.  Written material given at graduation.   Intimacy: - Group verbal instruction through game format to discuss how heart and lung disease can affect sexual intimacy. Written material given at graduation.. Flowsheet Row Cardiac Rehab from 11/10/2020 in [facility] Cardiac and Pulmonary Rehab  Date 10/27/20  Educator [facility]  Instruction Review Code 1- Verbalizes Understanding       Know Your Numbers and Heart Failure: - Group verbal and visual instruction to discuss disease risk factors for cardiac and pulmonary  disease and treatment options.  Reviews associated critical values for Overweight/Obesity, Hypertension, Cholesterol, and Diabetes.  Discusses basics of heart failure: signs/symptoms and treatments.  Introduces Heart Failure Zone chart for action plan for heart failure.  Written material given at graduation. Flowsheet Row Cardiac Rehab from 11/10/2020 in [facility] Cardiac and Pulmonary Rehab  Education need identified 10/06/20       Infection Prevention: - Provides verbal and written material to individual with discussion of infection control including proper hand washing and proper equipment cleaning during exercise session. Flowsheet Row Cardiac Rehab from 11/10/2020 in [facility] Cardiac and Pulmonary Rehab  Date 10/06/20  Educator [facility]  Instruction Review Code 1- Verbalizes Understanding       Falls Prevention: - Provides verbal and written material to individual with discussion of falls prevention and safety. Flowsheet Row Cardiac Rehab from 11/10/2020 in [facility] Cardiac and Pulmonary Rehab  Date 10/01/20  Educator [facility]  Instruction Review Code 1- Verbalizes Understanding       Other: -Provides group and verbal instruction on various topics (see comments)   Knowledge Questionnaire Score:  Knowledge Questionnaire Score - 10/06/20 1538       Knowledge Questionnaire Score   Pre Score 22/24 (angina, BP, nutrition, tobacco)             Core Components/Risk Factors/Patient Goals at Admission:  Personal Goals and Risk Factors at Admission - 10/06/20 1539       Core Components/Risk Factors/Patient Goals on Admission    Weight Management Yes;Weight Loss    Intervention Weight Management: Develop a combined nutrition and exercise program designed to reach desired caloric intake, while maintaining appropriate intake of nutrient and fiber, sodium and fats, and appropriate energy expenditure required for the weight goal.;Weight Management: Provide education and appropriate resources to help  participant work on and attain dietary goals.;Weight Management/Obesity: Establish reasonable short term and long term weight goals.    Admit Weight 199 lb 9.6 oz (90.5 kg)    Goal Weight: Short Term 195 lb (88.5 kg)    Goal Weight: Long Term 190 lb (86.2 kg)    Expected Outcomes Short Term: Continue to assess and modify interventions until short term weight is achieved;Long Term: Adherence to nutrition and physical activity/exercise program aimed toward attainment of established weight goal;Weight Loss: Understanding of general recommendations for a balanced deficit meal plan, which promotes 1-2 lb weight loss per week and includes a negative energy balance of [phone] kcal/d;Understanding recommendations for meals to include 15-35% energy as protein, 25-35% energy from fat, 35-60% energy from carbohydrates, less than 291m of dietary cholesterol, 20-35 gm of total fiber daily;Understanding of distribution of calorie intake throughout the day with the consumption of 4-5 meals/snacks    Diabetes Yes    Intervention Provide education about signs/symptoms and action to take for hypo/hyperglycemia.;Provide education about proper nutrition, including hydration, and aerobic/resistive exercise prescription along with prescribed medications to achieve blood glucose in normal ranges: Fasting glucose 65-99 mg/dL    Expected Outcomes  Short Term: Participant verbalizes understanding of the signs/symptoms and immediate care of hyper/hypoglycemia, proper foot care and importance of medication, aerobic/resistive exercise and nutrition plan for blood glucose control.;Long Term: Attainment of HbA1C < 7%.    Hypertension Yes    Intervention Provide education on lifestyle modifcations including regular physical activity/exercise, weight management, moderate sodium restriction and increased consumption of fresh fruit, vegetables, and low fat dairy, alcohol moderation, and smoking cessation.;Monitor prescription use  compliance.    Expected Outcomes Short Term: Continued assessment and intervention until BP is < 140/15mm HG in hypertensive participants. < 130/36mm HG in hypertensive participants with diabetes, heart failure or chronic kidney disease.;Long Term: Maintenance of blood pressure at goal levels.    Lipids Yes    Intervention Provide education and support for participant on nutrition & aerobic/resistive exercise along with prescribed medications to achieve LDL '70mg'$ , HDL >$Remo'40mg'IthIN$ .    Expected Outcomes Short Term: Participant states understanding of desired cholesterol values and is compliant with medications prescribed. Participant is following exercise prescription and nutrition guidelines.;Long Term: Cholesterol controlled with medications as prescribed, with individualized exercise RX and with personalized nutrition plan. Value goals: LDL < $Rem'70mg'sEVJ$ , HDL > 40 mg.             Education:Diabetes - Individual verbal and written instruction to review signs/symptoms of diabetes, desired ranges of glucose level fasting, after meals and with exercise. Acknowledge that pre and post exercise glucose checks will be done for 3 sessions at entry of program. [place] from 11/10/2020 in [facility] Cardiac and Pulmonary Rehab  Date 10/01/20  Educator [facility]  Instruction Review Code 1- Verbalizes Understanding       Core Components/Risk Factors/Patient Goals Review:   Goals and Risk Factor Review     Row Name 11/05/20 1029             Core Components/Risk Factors/Patient Goals Review   Personal Goals Review Weight Management/Obesity       Review Airelle wants to lose more weight. She wants to get to 180 pounds. Informed her to start looking at nutrition lables to watch her sugar and sodium.       Expected Outcomes Short: read more nutrition labels and watch sodium. Long: maintain reading nutrition labels and sodium intake independently                Core Components/Risk Factors/Patient Goals  at Discharge (Final Review):   Goals and Risk Factor Review - 11/05/20 1029       Core Components/Risk Factors/Patient Goals Review   Personal Goals Review Weight Management/Obesity    Review Meeyah wants to lose more weight. She wants to get to 180 pounds. Informed her to start looking at nutrition lables to watch her sugar and sodium.    Expected Outcomes Short: read more nutrition labels and watch sodium. Long: maintain reading nutrition labels and sodium intake independently             ITP Comments:  ITP Comments     Row Name 10/01/20 1329 10/06/20 1533 10/18/20 1415 10/20/20 0859 11/04/20 0928   ITP Comments Initial telephone orientation completed. Diagnosis can be found in [facility] 4/19. EP orientation scheduled for Wednesday 6/1 at 9:30 Completed 6MWT and gym orientation. Initial ITP created and sent for review to Dr. Emily Filbert, Medical Director. First full day of exercise!  Patient was oriented to gym and equipment including functions, settings, policies, and procedures.  Patient's individual exercise prescription and treatment plan were reviewed.  All  starting workloads were established based on the results of the 6 minute walk test done at initial orientation visit.  The plan for exercise progression was also introduced and progression will be customized based on patient's performance and goals. 30 Day review completed. Medical Director ITP review done, changes made as directed, and signed approval by Medical Director.  new to program Completed initial RD evaluation    Row Name 11/17/20 1130           ITP Comments 30 Day review completed. Medical Director ITP review done, changes made as directed, and signed approval by Medical Director.                Comments:

## 2020-11-19 ENCOUNTER — Encounter: Payer: 59 | Admitting: *Deleted

## 2020-11-19 ENCOUNTER — Other Ambulatory Visit: Payer: Self-pay

## 2020-11-19 DIAGNOSIS — Z951 Presence of aortocoronary bypass graft: Secondary | ICD-10-CM

## 2020-11-19 NOTE — Progress Notes (Signed)
Daily Session Note  Patient Details  Name: Lisa [name] MRN: [id number] Date of Birth: January 19, 1961 Referring Provider:   Flowsheet Row Cardiac Rehab from 10/06/2020 in [facility] Cardiac and Pulmonary Rehab  Referring Provider [name] MD       Encounter Date: 11/19/2020  Check In:  Session Check In - 11/19/20 0933       Check-In   Supervising physician immediately available to respond to emergencies See telemetry face sheet for immediately available ER MD    Location [facility]-Cardiac & Pulmonary Rehab    Staff Present Renita Papa, RN BSN;Joseph Tessie Fass, RCP,RRT,BSRT;Melissa [place], [place], LDN    Virtual Visit No    Medication changes reported     No    Fall or balance concerns reported    No    Warm-up and Cool-down Performed on first and last piece of equipment    Resistance Training Performed Yes    VAD Patient? No    PAD/SET Patient? No      Pain Assessment   Currently in Pain? No/denies                Social History   Tobacco Use  Smoking Status Never  Smokeless Tobacco Never    Goals Met:  Independence with exercise equipment Exercise tolerated well No report of cardiac concerns or symptoms Strength training completed today  Goals Unmet:  Not Applicable  Comments: Pt able to follow exercise prescription today without complaint.  Will continue to monitor for progression.    Dr. Emily Filbert is Medical Director for [place].  Dr. Ottie Glazier is Medical Director for [facility] Pulmonary Rehabilitation.

## 2020-11-24 ENCOUNTER — Other Ambulatory Visit: Payer: Self-pay

## 2020-11-24 DIAGNOSIS — Z951 Presence of aortocoronary bypass graft: Secondary | ICD-10-CM | POA: Diagnosis not present

## 2020-11-24 NOTE — Progress Notes (Signed)
Daily Session Note  Patient Details  Name: Lisa [name] MRN: [id number] Date of Birth: Sep 16, 1960 Referring Provider:   Flowsheet Row Cardiac Rehab from 10/06/2020 in [facility] Cardiac and Pulmonary Rehab  Referring Provider [name] MD       Encounter Date: 11/24/2020  Check In:  Session Check In - 11/24/20 0941       Check-In   Supervising physician immediately available to respond to emergencies See telemetry face sheet for immediately available ER MD    Location [facility]-Cardiac & Pulmonary Rehab    Staff Present Birdie Sons, MPA, Elveria Rising, BA, ACSM CEP, Exercise Physiologist;Joseph Tessie Fass, [place]    Virtual Visit No    Medication changes reported     No    Fall or balance concerns reported    No    Warm-up and Cool-down Performed on first and last piece of equipment    Resistance Training Performed Yes    VAD Patient? No    PAD/SET Patient? No      Pain Assessment   Currently in Pain? No/denies                Social History   Tobacco Use  Smoking Status Never  Smokeless Tobacco Never    Goals Met:  Independence with exercise equipment Exercise tolerated well No report of cardiac concerns or symptoms Strength training completed today  Goals Unmet:  Not Applicable  Comments: Pt able to follow exercise prescription today without complaint.  Will continue to monitor for progression.    Dr. Emily Filbert is Medical Director for [place].  Dr. Ottie Glazier is Medical Director for [facility] Pulmonary Rehabilitation.

## 2020-11-29 ENCOUNTER — Other Ambulatory Visit: Payer: Self-pay

## 2020-11-29 ENCOUNTER — Encounter: Payer: 59 | Admitting: *Deleted

## 2020-11-29 DIAGNOSIS — Z951 Presence of aortocoronary bypass graft: Secondary | ICD-10-CM

## 2020-11-29 NOTE — Progress Notes (Signed)
Daily Session Note  Patient Details  Name: Lisa [name] MRN: [id number] Date of Birth: 1960-09-27 Referring Provider:   Flowsheet Row Cardiac Rehab from 10/06/2020 in [facility] Cardiac and Pulmonary Rehab  Referring Provider [name] MD       Encounter Date: 11/29/2020  Check In:  Session Check In - 11/29/20 1023       Check-In   Supervising physician immediately available to respond to emergencies See telemetry face sheet for immediately available ER MD    Location [facility]-Cardiac & Pulmonary Rehab    Staff Present Heath Lark, RN, BSN, Laveda Norman, BS, ACSM CEP, Exercise Physiologist;Joseph [place], [place]    Virtual Visit No    Medication changes reported     No    Fall or balance concerns reported    No    Warm-up and Cool-down Performed on first and last piece of equipment    Resistance Training Performed Yes    VAD Patient? No    PAD/SET Patient? No      Pain Assessment   Currently in Pain? No/denies                Social History   Tobacco Use  Smoking Status Never  Smokeless Tobacco Never    Goals Met:  Independence with exercise equipment Exercise tolerated well Personal goals reviewed No report of cardiac concerns or symptoms  Goals Unmet:  Not Applicable  Comments: Pt able to follow exercise prescription today without complaint.  Will continue to monitor for progression.    Dr. Emily Filbert is Medical Director for [place].  Dr. Ottie Glazier is Medical Director for [facility] Pulmonary Rehabilitation.

## 2020-12-06 ENCOUNTER — Other Ambulatory Visit: Payer: Self-pay

## 2020-12-06 ENCOUNTER — Encounter: Payer: 59 | Attending: Internal Medicine | Admitting: *Deleted

## 2020-12-06 DIAGNOSIS — Z951 Presence of aortocoronary bypass graft: Secondary | ICD-10-CM | POA: Diagnosis present

## 2020-12-06 NOTE — Progress Notes (Signed)
Daily Session Note  Patient Details  Name: Lisa [name] MRN: [id number] Date of Birth: Apr 01, 1961 Referring Provider:   Flowsheet Row Cardiac Rehab from 10/06/2020 in [facility] Cardiac and Pulmonary Rehab  Referring Provider [name] MD       Encounter Date: 12/06/2020  Check In:  Session Check In - 12/06/20 1035       Check-In   Supervising physician immediately available to respond to emergencies See telemetry face sheet for immediately available ER MD    Location [facility]-Cardiac & Pulmonary Rehab    Staff Present Renita Papa, RN BSN;Joseph Bruno, RCP,RRT,BSRT;Kelly Dexter, BS, ACSM CEP, Exercise Physiologist;Kelly Rosalia Hammers, MPA, RN    Virtual Visit No    Medication changes reported     No    Fall or balance concerns reported    No    Warm-up and Cool-down Performed on first and last piece of equipment    Resistance Training Performed Yes    VAD Patient? No    PAD/SET Patient? No      Pain Assessment   Currently in Pain? No/denies                Social History   Tobacco Use  Smoking Status Never  Smokeless Tobacco Never    Goals Met:  Independence with exercise equipment Exercise tolerated well No report of cardiac concerns or symptoms Strength training completed today  Goals Unmet:  Not Applicable  Comments: Pt able to follow exercise prescription today without complaint.  Will continue to monitor for progression.    Dr. Emily Filbert is Medical Director for [place].  Dr. Ottie Glazier is Medical Director for [facility] Pulmonary Rehabilitation.

## 2020-12-14 ENCOUNTER — Telehealth: Payer: Self-pay

## 2020-12-14 NOTE — Telephone Encounter (Signed)
Left message for patient regarding Cardiac Rehab- last 3 sessions were missed. Asked for call back.

## 2020-12-15 ENCOUNTER — Encounter: Payer: Self-pay | Admitting: *Deleted

## 2020-12-15 DIAGNOSIS — Z951 Presence of aortocoronary bypass graft: Secondary | ICD-10-CM

## 2020-12-15 NOTE — Progress Notes (Signed)
Cardiac Individual Treatment Plan  Patient Details  Name: Lisa [name] MRN: [id number] Date of Birth: 05-Apr-1961 Referring Provider:   Flowsheet Row Cardiac Rehab from 10/06/2020 in [facility] Cardiac and Pulmonary Rehab  Referring Provider [name] MD       Initial Encounter Date:  Flowsheet Row Cardiac Rehab from 10/06/2020 in [facility] Cardiac and Pulmonary Rehab  Date 10/06/20       Visit Diagnosis: S/P CABG x 3  Patient's Home Medications on Admission:  Current Outpatient Medications:    aspirin 81 MG tablet, Take 1 tablet by mouth daily., Disp: , Rfl:    atorvastatin (LIPITOR) 80 MG tablet, Take 1 tablet (80 mg total) by mouth daily at 6 PM., Disp: 90 tablet, Rfl: 3   Blood Glucose Monitoring Suppl (FIFTY50 GLUCOSE METER 2.0) W/DEVICE KIT, Use as directed., Disp: , Rfl:    clopidogrel (PLAVIX) 75 MG tablet, Take 75 mg by mouth daily. (Patient not taking: Reported on 08/11/2020), Disp: , Rfl:    glyBURIDE (DIABETA) 2.5 MG tablet, Take 1 tablet by mouth daily. (Patient not taking: Reported on 08/11/2020), Disp: , Rfl:    insulin glargine (LANTUS) 100 UNIT/ML injection, Inject 50 Units into the skin daily., Disp: , Rfl:    JARDIANCE 25 MG TABS tablet, Take 25 mg daily by mouth. (Patient not taking: Reported on 08/11/2020), Disp: , Rfl: 6   lisinopril (ZESTRIL) 5 MG tablet, Take 5 mg by mouth daily., Disp: , Rfl:    metoprolol succinate (TOPROL-XL) 25 MG 24 hr tablet, Take 25 mg by mouth daily., Disp: , Rfl:    metoprolol tartrate (LOPRESSOR) 25 MG tablet, Take by mouth., Disp: , Rfl:    MULTIPLE VITAMINS-MINERALS PO, Take 1 tablet by mouth daily., Disp: , Rfl:    omeprazole (PRILOSEC) 40 MG capsule, TAKE 1 CAPSULE BY MOUTH  DAILY, Disp: 90 capsule, Rfl: 3   ondansetron (ZOFRAN) 4 MG tablet, Take 1 tablet (4 mg total) by mouth every 8 (eight) hours as needed for nausea or vomiting. (Patient not taking: Reported on 07/29/2020), Disp: 30 tablet, Rfl: 0   sulfamethoxazole-trimethoprim  (BACTRIM DS) 800-160 MG tablet, Take 1 tablet by mouth 2 (two) times daily. (Patient not taking: Reported on 08/11/2020), Disp: 6 tablet, Rfl: 0   terconazole (TERAZOL 7) 0.4 % vaginal cream, Place 1 applicator vaginally at bedtime., Disp: 45 g, Rfl: 0   ursodiol (ACTIGALL) 300 MG capsule, Take 300 mg by mouth daily. , Disp: , Rfl:   Past Medical History: Past Medical History:  Diagnosis Date   Allergic rhinitis 02/15/2015   Arthritis    BP (high blood pressure) 02/15/2015   Cervical lymphadenopathy    Cirrhosis ([place])    Diabetes mellitus    Elevated liver enzymes 09/23/2015   Elevated liver enzymes 09/23/2015   GERD (gastroesophageal reflux disease)    Gonalgia 02/15/2015   Hernia, lateral ventral 11/19/2012   Hypertension    Intra-abdominal and pelvic swelling, mass and lump, unspecified site 11/19/2012   Lymphadenopathy 03/18/2017    Tobacco Use: Social History   Tobacco Use  Smoking Status Never  Smokeless Tobacco Never    Labs: Recent Review Flowsheet Data     Labs for ITP Cardiac and Pulmonary Rehab Latest Ref Rng & Units 09/22/2015 05/22/2017 12/26/2017 11/05/2018 06/04/2019   Cholestrol 0 - 200 305(H) - 359(H) 355(A) -   LDLCALC - 205(H) - 211(H) 207 -   HDL 35 - 70 84 - 127 133(A) -   Trlycerides 40 - 160 82 -  106 74 -   Hemoglobin A1c - - 8.3 12.1(H) - 10.5        Exercise Target Goals: Exercise Program Goal: Individual exercise prescription set using results from initial 6 min walk test and THRR while considering  patient's activity barriers and safety.   Exercise Prescription Goal: Initial exercise prescription builds to 30-45 minutes a day of aerobic activity, 2-3 days per week.  Home exercise guidelines will be given to patient during program as part of exercise prescription that the participant will acknowledge.   Education: Aerobic Exercise: - Group verbal and visual presentation on the components of exercise prescription. Introduces F.I.T.T principle from  ACSM for exercise prescriptions.  Reviews F.I.T.T. principles of aerobic exercise including progression. Written material given at graduation. Flowsheet Row Cardiac Rehab from 11/24/2020 in [facility] Cardiac and Pulmonary Rehab  Date 10/27/20  Educator [facility]  Instruction Review Code 1- Verbalizes Understanding       Education: Resistance Exercise: - Group verbal and visual presentation on the components of exercise prescription. Introduces F.I.T.T principle from ACSM for exercise prescriptions  Reviews F.I.T.T. principles of resistance exercise including progression. Written material given at graduation. Flowsheet Row Cardiac Rehab from 11/24/2020 in [facility] Cardiac and Pulmonary Rehab  Date 11/03/20  Educator [facility]  Instruction Review Code 1- Verbalizes Understanding        Education: Exercise & Equipment Safety: - Individual verbal instruction and demonstration of equipment use and safety with use of the equipment. Flowsheet Row Cardiac Rehab from 11/24/2020 in [facility] Cardiac and Pulmonary Rehab  Date 10/06/20  Educator [facility]  Instruction Review Code 1- Verbalizes Understanding       Education: Exercise Physiology & General Exercise Guidelines: - Group verbal and written instruction with models to review the exercise physiology of the cardiovascular system and associated critical values. Provides general exercise guidelines with specific guidelines to those with heart or lung disease.    Education: Flexibility, Balance, Mind/Body Relaxation: - Group verbal and visual presentation with interactive activity on the components of exercise prescription. Introduces F.I.T.T principle from ACSM for exercise prescriptions. Reviews F.I.T.T. principles of flexibility and balance exercise training including progression. Also discusses the mind body connection.  Reviews various relaxation techniques to help reduce and manage stress (i.e. Deep breathing, progressive muscle relaxation, and visualization). Balance  handout provided to take home. Written material given at graduation. Flowsheet Row Cardiac Rehab from 11/24/2020 in [facility] Cardiac and Pulmonary Rehab  Date 11/10/20  Educator [facility]  Instruction Review Code 1- Verbalizes Understanding       Activity Barriers & Risk Stratification:  Activity Barriers & Cardiac Risk Stratification - 10/06/20 1534       Activity Barriers & Cardiac Risk Stratification   Activity Barriers Incisional Pain;Left Knee Replacement;Muscular Weakness;Shortness of Breath;Deconditioning;Balance Concerns    Cardiac Risk Stratification High             6 Minute Walk:  6 Minute Walk     Row Name 10/06/20 1533         6 Minute Walk   Phase Initial     Distance 890 feet     Walk Time 6 minutes     # of Rest Breaks 0     MPH 1.68     METS 2.76     RPE 11     VO2 Peak 9.67     Symptoms No     Resting HR 87 bpm     Resting BP 128/62     Resting Oxygen Saturation  98 %     Exercise Oxygen Saturation  during 6 min walk 94 %     Max Ex. HR 98 bpm     Max Ex. BP 134/72     2 Minute Post BP 126/64              Oxygen Initial Assessment:   Oxygen Re-Evaluation:   Oxygen Discharge (Final Oxygen Re-Evaluation):   Initial Exercise Prescription:  Initial Exercise Prescription - 10/06/20 1500       Date of Initial Exercise RX and Referring Provider   Date 10/06/20    Referring Provider [name] MD      Treadmill   MPH 1.6    Grade 0.5    Minutes 15    METs 2.32      Recumbant Bike   Level 1    RPM 50    Watts 22    Minutes 15    METs 2      REL-XR   Level 1    Speed 50    Minutes 15    METs 2      Prescription Details   Frequency (times per week) 3    Duration Progress to 30 minutes of continuous aerobic without signs/symptoms of physical distress      Intensity   THRR 40-80% of Max Heartrate 117-146    Ratings of Perceived Exertion 11-13    Perceived Dyspnea 0-4      Progression   Progression Continue to progress  workloads to maintain intensity without signs/symptoms of physical distress.      Resistance Training   Training Prescription Yes    Weight 3 lb    Reps 10-15             Perform Capillary Blood Glucose checks as needed.  Exercise Prescription Changes:   Exercise Prescription Changes     Row Name 10/06/20 1500 10/20/20 0900 11/01/20 1400 11/16/20 1500 11/29/20 1600     Response to Exercise   Blood Pressure (Admit) 128/62 122/70 110/68 110/80 122/62   Blood Pressure (Exercise) 134/72 146/78 162/80 118/68 --   Blood Pressure (Exit) 126/64 112/68 112/62 110/80 118/62   Heart Rate (Admit) 87 bpm 79 bpm 60 bpm 84 bpm 88 bpm   Heart Rate (Exercise) 98 bpm 101 bpm 110 bpm 107 bpm 132 bpm   Heart Rate (Exit) 88 bpm 89 bpm 77 bpm 88 bpm 81 bpm   Oxygen Saturation (Admit) 98 % -- -- -- --   Oxygen Saturation (Exercise) 94 % -- -- -- --   Rating of Perceived Exertion (Exercise) 11 6 14 17 15    Symptoms none none none none none   Comments walk test results first day -- -- --   Duration -- -- Continue with 30 min of aerobic exercise without signs/symptoms of physical distress. Continue with 30 min of aerobic exercise without signs/symptoms of physical distress. Continue with 30 min of aerobic exercise without signs/symptoms of physical distress.   Intensity -- -- THRR unchanged THRR unchanged THRR unchanged     Progression   Progression -- -- Continue to progress workloads to maintain intensity without signs/symptoms of physical distress. Continue to progress workloads to maintain intensity without signs/symptoms of physical distress. Continue to progress workloads to maintain intensity without signs/symptoms of physical distress.   Average METs -- -- 2.7 2.77 3.16     Resistance Training   Training Prescription -- Yes Yes Yes Yes   Weight -- 3 lb 3 lb  3 lb 3 lb   Reps -- 10-15 10-15 10-15 10-15     Interval Training   Interval Training -- -- No No No     Treadmill   MPH -- -- --  2.5 2.5   Grade -- -- -- 1 1   Minutes -- -- -- 15 15   METs -- -- -- 3.26 3.26     Recumbant Bike   Level -- -- $Rem'3 3 4   'KPbg$ Watts -- -- $Rem'23 25 30   'HbFU$ Minutes -- -- $Rem'15 15 15   'OfEr$ METs -- -- 2.87 -- 3.19     REL-XR   Level -- $Remove'1 1 1 'GczHPdo$ --   Minutes -- $RemoveBe'15 15 15 'pWEhQrtOL$ --   METs -- 2 2.6 2.7 --     Track   Laps -- -- -- 25 18   Minutes -- -- -- 15 15   METs -- -- -- 2.36 1.98            Exercise Comments:   Exercise Comments     Row Name 10/18/20 1416           Exercise Comments First full day of exercise!  Patient was oriented to gym and equipment including functions, settings, policies, and procedures.  Patient's individual exercise prescription and treatment plan were reviewed.  All starting workloads were established based on the results of the 6 minute walk test done at initial orientation visit.  The plan for exercise progression was also introduced and progression will be customized based on patient's performance and goals.                Exercise Goals and Review:   Exercise Goals     Row Name 10/06/20 1536             Exercise Goals   Increase Physical Activity Yes       Intervention Provide advice, education, support and counseling about physical activity/exercise needs.;Develop an individualized exercise prescription for aerobic and resistive training based on initial evaluation findings, risk stratification, comorbidities and participant's personal goals.       Expected Outcomes Short Term: Attend rehab on a regular basis to increase amount of physical activity.;Long Term: Exercising regularly at least 3-5 days a week.;Long Term: Add in home exercise to make exercise part of routine and to increase amount of physical activity.       Increase Strength and Stamina Yes       Intervention Provide advice, education, support and counseling about physical activity/exercise needs.;Develop an individualized exercise prescription for aerobic and resistive training based on  initial evaluation findings, risk stratification, comorbidities and participant's personal goals.       Expected Outcomes Short Term: Increase workloads from initial exercise prescription for resistance, speed, and METs.;Short Term: Perform resistance training exercises routinely during rehab and add in resistance training at home;Long Term: Improve cardiorespiratory fitness, muscular endurance and strength as measured by increased METs and functional capacity (6MWT)       Able to understand and use rate of perceived exertion (RPE) scale Yes       Intervention Provide education and explanation on how to use RPE scale       Expected Outcomes Short Term: Able to use RPE daily in rehab to express subjective intensity level;Long Term:  Able to use RPE to guide intensity level when exercising independently       Able to understand and use Dyspnea scale Yes       Intervention  Provide education and explanation on how to use Dyspnea scale       Expected Outcomes Long Term: Able to use Dyspnea scale to guide intensity level when exercising independently;Short Term: Able to use Dyspnea scale daily in rehab to express subjective sense of shortness of breath during exertion       Knowledge and understanding of Target Heart Rate Range (THRR) Yes       Intervention Provide education and explanation of THRR including how the numbers were predicted and where they are located for reference       Expected Outcomes Short Term: Able to state/look up THRR;Short Term: Able to use daily as guideline for intensity in rehab;Long Term: Able to use THRR to govern intensity when exercising independently       Able to check pulse independently Yes       Intervention Provide education and demonstration on how to check pulse in carotid and radial arteries.;Review the importance of being able to check your own pulse for safety during independent exercise       Expected Outcomes Short Term: Able to explain why pulse checking is  important during independent exercise;Long Term: Able to check pulse independently and accurately       Understanding of Exercise Prescription Yes       Intervention Provide education, explanation, and written materials on patient's individual exercise prescription       Expected Outcomes Short Term: Able to explain program exercise prescription;Long Term: Able to explain home exercise prescription to exercise independently                Exercise Goals Re-Evaluation :  Exercise Goals Re-Evaluation     Row Name 10/18/20 1416 11/01/20 1452 11/16/20 1535 11/29/20 1013       Exercise Goal Re-Evaluation   Exercise Goals Review Able to understand and use rate of perceived exertion (RPE) scale;Able to understand and use Dyspnea scale;Knowledge and understanding of Target Heart Rate Range (THRR);Understanding of Exercise Prescription Increase Physical Activity;Increase Strength and Stamina;Understanding of Exercise Prescription Increase Physical Activity;Increase Strength and Stamina;Understanding of Exercise Prescription Increase Physical Activity;Increase Strength and Stamina;Understanding of Exercise Prescription    Comments Reviewed RPE and dyspnea scales, THR and program prescription with pt today.  Pt voiced understanding and was given a copy of goals to take home. Lisa [name] is doing well in rehab.  She is up to 30 laps on the track now.  She is also doing 23 watts on the bike.  We will continue to monitor her progress. Lisa [name] is continuing to do well. She has increased to a speed of 2.5 and 1% incline on the treadmill but is still maintaining the same number of laps on the track. Will continue to monitor. Lisa [name] reports that she is feeling stronger and is walking and doing weights at home on days that she does not have rehab. She is now back to work and was able to do yard work this weekend. She reports improvement in her SOB and is motivated to continue her exercise routine even though her schedule has  changed since going back to work.    Expected Outcomes Short: Use RPE daily to regulate intensity. Long: Follow program prescription in THR. Short: Continue to attend rehab regularly Long: Conitnue to follow program prescription Short: Increase number of laps on track Long: Continue to increase strength and stamina Short: Continue to exercise 1-2 days per week outside of rehab. Long: Continue to increase strength and stamina  Discharge Exercise Prescription (Final Exercise Prescription Changes):  Exercise Prescription Changes - 11/29/20 1600       Response to Exercise   Blood Pressure (Admit) 122/62    Blood Pressure (Exit) 118/62    Heart Rate (Admit) 88 bpm    Heart Rate (Exercise) 132 bpm    Heart Rate (Exit) 81 bpm    Rating of Perceived Exertion (Exercise) 15    Symptoms none    Duration Continue with 30 min of aerobic exercise without signs/symptoms of physical distress.    Intensity THRR unchanged      Progression   Progression Continue to progress workloads to maintain intensity without signs/symptoms of physical distress.    Average METs 3.16      Resistance Training   Training Prescription Yes    Weight 3 lb    Reps 10-15      Interval Training   Interval Training No      Treadmill   MPH 2.5    Grade 1    Minutes 15    METs 3.26      Recumbant Bike   Level 4    Watts 30    Minutes 15    METs 3.19      Track   Laps 18    Minutes 15    METs 1.98             Nutrition:  Target Goals: Understanding of nutrition guidelines, daily intake of sodium '1500mg'$ , cholesterol '200mg'$ , calories 30% from fat and 7% or less from saturated fats, daily to have 5 or more servings of fruits and vegetables.  Education: All About Nutrition: -Group instruction provided by verbal, written material, interactive activities, discussions, models, and posters to present general guidelines for heart healthy nutrition including fat, fiber, MyPlate, the role of  sodium in heart healthy nutrition, utilization of the nutrition label, and utilization of this knowledge for meal planning. Follow up email sent as well. Written material given at graduation. Flowsheet Row Cardiac Rehab from 11/24/2020 in [facility] Cardiac and Pulmonary Rehab  Education need identified 10/06/20       Biometrics:  Pre Biometrics - 10/06/20 1537       Pre Biometrics   Height $Remov'5\' 10"'aEqwbs$  (1.778 m)    Weight 199 lb 9.6 oz (90.5 kg)    BMI (Calculated) 28.64    Single Leg Stand 7.1 seconds              Nutrition Therapy Plan and Nutrition Goals:  Nutrition Therapy & Goals - 11/04/20 0851       Nutrition Therapy   Diet Heart healthy, low Na, diabetes friendly    Drug/Food Interactions Statins/Certain Fruits    Protein (specify units) 70g    Fiber 25 grams    Whole Grain Foods 3 servings    Saturated Fats 12 max. grams    Fruits and Vegetables 8 servings/day    Sodium 1.5 grams      Personal Nutrition Goals   Nutrition Goal ST: add protein/fat to fruit at 3am 3rd shift snack: nuts/seeds, greek yogurt, a boiled egg or cubes of cheese  LT: Use MyPlate structure when eating meals and continue to enjoy the meals she is used to    Comments PYP score was 83. She reports trying hard to eat well and is now boiling plaintains instead of frying. She is from the [place]; her curry chicken and gravy is low in saturated fat, if lowered salt in recipe would be  considered heart healthy - she was nervous she would no longer be able to eat that anymore. She reports loving her rice and collards. Discussed MyPlate structure for meal planning as well as heart healthy and diabetes frienly eating. She works 3rd shift and has a [employer] of fruit at [place], but feels like its hard to hold out until breakfast, discussed adding protein and fat like nuts/seeds, greek yogurt, a boiled egg or cubes of cheese. She would like to try this out.      Intervention Plan   Intervention Prescribe, educate  and counsel regarding individualized specific dietary modifications aiming towards targeted core components such as weight, hypertension, lipid management, diabetes, heart failure and other comorbidities.    Expected Outcomes Short Term Goal: Understand basic principles of dietary content, such as calories, fat, sodium, cholesterol and nutrients.;Short Term Goal: A plan has been developed with personal nutrition goals set during dietitian appointment.;Long Term Goal: Adherence to prescribed nutrition plan.             Nutrition Assessments:  MEDIFICTS Score Key: ?70 Need to make dietary changes  40-70 Heart Healthy Diet ? 40 Therapeutic Level Cholesterol Diet  Flowsheet Row Cardiac Rehab from 10/06/2020 in [facility] Cardiac and Pulmonary Rehab  Picture Your Plate Total Score on Admission 83      Picture Your Plate Scores: <64 Unhealthy dietary pattern with much room for improvement. 41-50 Dietary pattern unlikely to meet recommendations for good health and room for improvement. 51-60 More healthful dietary pattern, with some room for improvement.  >60 Healthy dietary pattern, although there may be some specific behaviors that could be improved.    Nutrition Goals Re-Evaluation:  Nutrition Goals Re-Evaluation     Lisa [name] Name 11/29/20 1003             Goals   Nutrition Goal ST: add protein/fat to fruit at 3am 3rd shift snack: nuts/seeds, greek yogurt, a boiled egg or cubes of cheese  LT: Use MyPlate structure when eating meals and continue to enjoy the meals she is used to       Comment Lisa [name] has reported that she is working hard in [employer] social [employer]. She has cut out pasta and is trying to reduce the amount of bread in her diet. She has added greek yoghurt and nuts to her snack at work during her night shift to increas her protein intake.       Expected Outcome Short: work on reducing bread portions Long: maintain heart healthy balanced diet.                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 She has cut out pasta and is trying to reduce the amount of bread in her diet. She has added greek yoghurt and nuts to her snack at work during her night shift to increas her protein intake.    Expected Outcome Short: work on reducing bread portions Long: maintain heart healthy balanced diet.             Psychosocial: Target Goals: Acknowledge presence  or absence of significant depression and/or stress, maximize coping skills, provide positive support system. Participant is able to verbalize types and ability to use techniques and skills needed for reducing stress and depression.   Education: Stress, Anxiety, and Depression - Group verbal and visual presentation to define topics covered.  Reviews how body is impacted by stress, anxiety, and depression.  Also discusses healthy ways to reduce stress and to treat/manage anxiety and depression.  Written material given at graduation.   Education: Sleep Hygiene -Provides group verbal and written instruction about how sleep can affect your health.  Define sleep hygiene, discuss sleep cycles and impact of sleep habits. Review good sleep hygiene tips.    Initial Review & Psychosocial Screening:  Initial Psych Review & Screening - 10/01/20 1307       Initial Review   Current issues with Current Stress Concerns    Source of Stress Concerns Unable to perform yard/household activities;Unable to participate in former interests or hobbies;Family;Occupation      Family Dynamics   Good Support System? No   some local family    Strains Intra-family strains    Comments husband is not supportive; she states she has learned she can only count on herself and not other people      Barriers   Psychosocial barriers to participate in program There are no identifiable barriers or psychosocial needs.      Screening Interventions   Interventions Encouraged to exercise;To provide support and resources with identified psychosocial needs;Provide feedback about the scores to participant    Expected Outcomes Short Term goal: Utilizing psychosocial counselor, staff and physician to assist with identification of specific Stressors or current issues interfering with healing process. Setting desired goal for each stressor or current issue identified.;Long Term Goal: Stressors or current issues are controlled or eliminated.;Short Term goal: Identification and review with participant of any Quality of Life or Depression concerns found by scoring the questionnaire.;Long Term goal: The participant improves quality of Life and PHQ9 Scores as seen by post scores and/or verbalization of changes             Quality of Life Scores:   Quality of Life - 10/06/20 1537       Quality of Life   Select Quality of Life      Quality of Life Scores   Health/Function Pre 26.53 %    Socioeconomic Pre 29.25 %    Psych/Spiritual Pre 30 %    Family Pre 25.9 %    GLOBAL Pre 27.89 %            Scores of 19 and below usually indicate a poorer quality of life in these areas.  A difference of  2-3 points is a clinically meaningful difference.  A difference of 2-3 points in the total score of the Quality of Life Index has been associated with significant improvement in overall quality of life, self-image, physical symptoms, and general health in studies assessing change in quality of life.  PHQ-9: Recent Review Flowsheet Data     Depression screen [facility] 2/9 10/06/2020 07/18/2019 12/24/2017 09/20/2015   Decreased Interest 0 1 0 0   Down, Depressed,  Hopeless 0 0 0 0   PHQ - 2 Score 0 1 0 0   Altered sleeping 0 0 0 -   Tired, decreased energy 1 2 0 -   Change in appetite 3 0 0 -   Feeling bad or failure about yourself  0 0 0 -  Trouble concentrating 0 0 0 -   Moving slowly or fidgety/restless 0 0 0 -   Suicidal thoughts 0 0 0 -   PHQ-9 Score 4 3 0 -   Difficult doing work/chores Not difficult at all Not difficult at all Not difficult at all -      Interpretation of Total Score  Total Score Depression Severity:  1-4 = Minimal depression, 5-9 = Mild depression, 10-14 = Moderate depression, 15-19 = Moderately severe depression, 20-27 = Severe depression   Psychosocial Evaluation and Intervention:  Psychosocial Evaluation - 10/01/20 1323       Psychosocial Evaluation & Interventions   Interventions Encouraged to exercise with the program and follow exercise prescription;Stress management education;Relaxation education    Comments Lisa [name] is referred to cardiac rehab post CABG x 3. She states she is feeling a little better each day and is ready to start the program so she can learn what she should be doing and how to eat and manage a heart healthy lifestyle. She is out of work for now, but plans on returning to [employer] when released where she was working night shift and a lot overtime. She is thankful that her supervisor has had a CABG in the past so she is being very supportive. Lisa [name] states that she and her husband are very disconnected, she does not feel supported at all by him. She prides herself in being a strong, indpendent woman and knows she has two feet to stand on financially and mentally if they were to separate. Her main focus right now is gaining her stamina back so she can live her life the way she wants to.    Expected Outcomes Short: attend cardiac rehab for education and exercise. Long: develop positive self care habits.    Continue Psychosocial Services  Follow up required by staff             Psychosocial  Re-Evaluation:  Psychosocial Re-Evaluation     [place] Name 11/05/20 1021 11/29/20 1009           Psychosocial Re-Evaluation   Current issues with Current Stress Concerns Current Stress Concerns      Comments Her husband has been more supportive since her journey for better health. She wants to not rely on anybody but knows that she needs help sometimes. Her husband has been proud of the progress that she has made in the program. Lisa [name] reports that she has been able to get back to some more of her ADL and hobbies as she feels her exercise is helping her get stronger. She has been able to go back to work and was able to do yardwork this weekend. She is currently working on adjusting to her new schedule now that she is back to work, but is  motivated to continue to be consistent with her exercise routine.      Expected Outcomes Short: Continue to exercise regularly to support mental health and notify staff of any changes. Long: maintain mental health and well being through teaching of rehab or prescribed medications independently. Short: Continue to exercise regularly to support mental health and notify staff of any changes. Long: maintain mental health and well being through teaching of rehab or prescribed medications independently.      Interventions Encouraged to attend Cardiac Rehabilitation for the exercise Encouraged to attend Cardiac Rehabilitation for the exercise      Continue Psychosocial Services  Follow up required by staff Follow up required by staff  Psychosocial Discharge (Final Psychosocial Re-Evaluation):  Psychosocial Re-Evaluation - 11/29/20 1009       Psychosocial Re-Evaluation   Current issues with Current Stress Concerns    Comments Lisa [name] reports that she has been able to get back to some more of her ADL and hobbies as she feels her exercise is helping her get stronger. She has been able to go back to work and was able to do yardwork this weekend. She is  currently working on adjusting to her new schedule now that she is back to work, but is  motivated to continue to be consistent with her exercise routine.    Expected Outcomes Short: Continue to exercise regularly to support mental health and notify staff of any changes. Long: maintain mental health and well being through teaching of rehab or prescribed medications independently.    Interventions Encouraged to attend Cardiac Rehabilitation for the exercise    Continue Psychosocial Services  Follow up required by staff             Vocational Rehabilitation: Provide vocational rehab assistance to qualifying candidates.   Vocational Rehab Evaluation & Intervention:  Vocational Rehab - 10/01/20 1307       Initial Vocational Rehab Evaluation & Intervention   Assessment shows need for Vocational Rehabilitation No             Education: Education Goals: Education classes will be provided on a variety of topics geared toward better understanding of heart health and risk factor modification. Participant will state understanding/return demonstration of topics presented as noted by education test scores.  Learning Barriers/Preferences:  Learning Barriers/Preferences - 10/01/20 1306       Learning Barriers/Preferences   Learning Barriers None    Learning Preferences None             General Cardiac Education Topics:  AED/CPR: - Group verbal and written instruction with the use of models to demonstrate the basic use of the AED with the basic ABC's of resuscitation.   Anatomy and Cardiac Procedures: - Group verbal and visual presentation and models provide information about basic cardiac anatomy and function. Reviews the testing methods done to diagnose heart disease and the outcomes of the test results. Describes the treatment choices: Medical Management, Angioplasty, or Coronary Bypass Surgery for treating various heart conditions including Myocardial Infarction, Angina, Valve  Disease, and Cardiac Arrhythmias.  Written material given at graduation. Flowsheet Row Cardiac Rehab from 11/24/2020 in [facility] Cardiac and Pulmonary Rehab  Date 11/03/20  Educator SB  Instruction Review Code 1- Verbalizes Understanding       Medication Safety: - Group verbal and visual instruction to review commonly prescribed medications for heart and lung disease. Reviews the medication, class of the drug, and side effects. Includes the steps to properly store meds and maintain the prescription regimen.  Written material given at graduation. Flowsheet Row Cardiac Rehab from 11/24/2020 in [facility] Cardiac and Pulmonary Rehab  Date 11/24/20  Educator SB  Instruction Review Code 1- Verbalizes Understanding       Intimacy: - Group verbal instruction through game format to discuss how heart and lung disease can affect sexual intimacy. Written material given at graduation.. Flowsheet Row Cardiac Rehab from 11/24/2020 in [facility] Cardiac and Pulmonary Rehab  Date 10/27/20  Educator [facility]  Instruction Review Code 1- Verbalizes Understanding       Know Your Numbers and Heart Failure: - Group verbal and visual instruction to discuss disease risk factors for cardiac and pulmonary disease and treatment options.  Reviews associated critical values for Overweight/Obesity, Hypertension, Cholesterol, and Diabetes.  Discusses basics of heart failure: signs/symptoms and treatments.  Introduces Heart Failure Zone chart for action plan for heart failure.  Written material given at graduation. Flowsheet Row Cardiac Rehab from 11/24/2020 in [facility] Cardiac and Pulmonary Rehab  Education need identified 10/06/20       Infection Prevention: - Provides verbal and written material to individual with discussion of infection control including proper hand washing and proper equipment cleaning during exercise session. Flowsheet Row Cardiac Rehab from 11/24/2020 in [facility] Cardiac and Pulmonary Rehab  Date 10/06/20  Educator [facility]   Instruction Review Code 1- Verbalizes Understanding       Falls Prevention: - Provides verbal and written material to individual with discussion of falls prevention and safety. Flowsheet Row Cardiac Rehab from 11/24/2020 in [facility] Cardiac and Pulmonary Rehab  Date 10/01/20  Educator [facility]  Instruction Review Code 1- Verbalizes Understanding       Other: -Provides group and verbal instruction on various topics (see comments)   Knowledge Questionnaire Score:  Knowledge Questionnaire Score - 10/06/20 1538       Knowledge Questionnaire Score   Pre Score 22/24 (angina, BP, nutrition, tobacco)             Core Components/Risk Factors/Patient Goals at Admission:  Personal Goals and Risk Factors at Admission - 10/06/20 1539       Core Components/Risk Factors/Patient Goals on Admission    Weight Management Yes;Weight Loss    Intervention Weight Management: Develop a combined nutrition and exercise program designed to reach desired caloric intake, while maintaining appropriate intake of nutrient and fiber, sodium and fats, and appropriate energy expenditure required for the weight goal.;Weight Management: Provide education and appropriate resources to help participant work on and attain dietary goals.;Weight Management/Obesity: Establish reasonable short term and long term weight goals.    Admit Weight 199 lb 9.6 oz (90.5 kg)    Goal Weight: Short Term 195 lb (88.5 kg)    Goal Weight: Long Term 190 lb (86.2 kg)    Expected Outcomes Short Term: Continue to assess and modify interventions until short term weight is achieved;Long Term: Adherence to nutrition and physical activity/exercise program aimed toward attainment of established weight goal;Weight Loss: Understanding of general recommendations for a balanced deficit meal plan, which promotes 1-2 lb weight loss per week and includes a negative energy balance of [phone] kcal/d;Understanding recommendations for meals to include 15-35%  energy as protein, 25-35% energy from fat, 35-60% energy from carbohydrates, less than $RemoveB'200mg'CJgHtiMG$  of dietary cholesterol, 20-35 gm of total fiber daily;Understanding of distribution of calorie intake throughout the day with the consumption of 4-5 meals/snacks    Diabetes Yes    Intervention Provide education about signs/symptoms and action to take for hypo/hyperglycemia.;Provide education about proper nutrition, including hydration, and aerobic/resistive exercise prescription along with prescribed medications to achieve blood glucose in normal ranges: Fasting glucose 65-99 mg/dL    Expected Outcomes Short Term: Participant verbalizes understanding of the signs/symptoms and immediate care of hyper/hypoglycemia, proper foot care and importance of medication, aerobic/resistive exercise and nutrition plan for blood glucose control.;Long Term: Attainment of HbA1C < 7%.    Hypertension Yes    Intervention Provide education on lifestyle modifcations including regular physical activity/exercise, weight management, moderate sodium restriction and increased consumption of fresh fruit, vegetables, and low fat dairy, alcohol moderation, and smoking cessation.;Monitor prescription use compliance.    Expected Outcomes Short Term: Continued assessment and intervention until BP is < 140/40mm  HG in hypertensive participants. < 130/38mm HG in hypertensive participants with diabetes, heart failure or chronic kidney disease.;Long Term: Maintenance of blood pressure at goal levels.    Lipids Yes    Intervention Provide education and support for participant on nutrition & aerobic/resistive exercise along with prescribed medications to achieve LDL '70mg'$ , HDL >$Remo'40mg'ErmeU$ .    Expected Outcomes Short Term: Participant states understanding of desired cholesterol values and is compliant with medications prescribed. Participant is following exercise prescription and nutrition guidelines.;Long Term: Cholesterol controlled with medications as  prescribed, with individualized exercise RX and with personalized nutrition plan. Value goals: LDL < $Rem'70mg'DiVR$ , HDL > 40 mg.             Education:Diabetes - Individual verbal and written instruction to review signs/symptoms of diabetes, desired ranges of glucose level fasting, after meals and with exercise. Acknowledge that pre and post exercise glucose checks will be done for 3 sessions at entry of program. [place] from 11/24/2020 in [facility] Cardiac and Pulmonary Rehab  Date 10/01/20  Educator [facility]  Instruction Review Code 1- Verbalizes Understanding       Core Components/Risk Factors/Patient Goals Review:   Goals and Risk Factor Review     Row Name 11/05/20 1029 11/29/20 1006           Core Components/Risk Factors/Patient Goals Review   Personal Goals Review Weight Management/Obesity Weight Management/Obesity      Review Lisa [name] wants to lose more weight. She wants to get to 180 pounds. Informed her to start looking at nutrition lables to watch her sugar and sodium. Lisa [name] stated that she has seen mostly a downward trend in her weight. She is taking all meds. consistently working on making heart healthy dietary choices and exercising regulary to help control cardaic risk factors.      Expected Outcomes Short: read more nutrition labels and watch sodium. Long: maintain reading nutrition labels and sodium intake independently Short: continue to take all meds as prescribed. Short term weigth goal is to stay under 200 lbs.  Long: Control cardiac risk factors with meds, exercise routine, and heart healthy diet. Long term weight goal 180 lbs.               Core Components/Risk Factors/Patient Goals at Discharge (Final Review):   Goals and Risk Factor Review - 11/29/20 1006       Core Components/Risk Factors/Patient Goals Review   Personal Goals Review Weight Management/Obesity    Review Lisa [name] stated that she has seen mostly a downward trend in her weight. She is taking  all meds. consistently working on making heart healthy dietary choices and exercising regulary to help control cardaic risk factors.    Expected Outcomes Short: continue to take all meds as prescribed. Short term weigth goal is to stay under 200 lbs.  Long: Control cardiac risk factors with meds, exercise routine, and heart healthy diet. Long term weight goal 180 lbs.             ITP Comments:  ITP Comments     Row Name 10/01/20 1329 10/06/20 1533 10/18/20 1415 10/20/20 0859 11/04/20 0928   ITP Comments Initial telephone orientation completed. Diagnosis can be found in [facility] 4/19. EP orientation scheduled for Wednesday 6/1 at 9:30 Completed 6MWT and gym orientation. Initial ITP created and sent for review to Dr. Emily Filbert, Medical Director. First full day of exercise!  Patient was oriented to gym and equipment including functions, settings, policies, and procedures.  Patient's individual exercise  prescription and treatment plan were reviewed.  All starting workloads were established based on the results of the 6 minute walk test done at initial orientation visit.  The plan for exercise progression was also introduced and progression will be customized based on patient's performance and goals. 30 Day review completed. Medical Director ITP review done, changes made as directed, and signed approval by Medical Director.  new to program Completed initial RD evaluation    Row Name 11/17/20 1130 12/15/20 0803         ITP Comments 30 Day review completed. Medical Director ITP review done, changes made as directed, and signed approval by Medical Director. 30 Day review completed. Medical Director ITP review done, changes made as directed, and signed approval by Medical Director.               Comments:

## 2020-12-23 ENCOUNTER — Telehealth: Payer: Self-pay

## 2020-12-23 ENCOUNTER — Ambulatory Visit: Payer: 59

## 2020-12-23 NOTE — Telephone Encounter (Signed)
Left message for patient regarding Cardiac Rehab- asking for callback. 

## 2020-12-27 ENCOUNTER — Ambulatory Visit: Payer: 59

## 2020-12-27 ENCOUNTER — Encounter: Payer: Self-pay | Admitting: *Deleted

## 2020-12-27 DIAGNOSIS — Z951 Presence of aortocoronary bypass graft: Secondary | ICD-10-CM

## 2020-12-29 ENCOUNTER — Ambulatory Visit: Payer: 59

## 2020-12-30 ENCOUNTER — Ambulatory Visit: Payer: 59

## 2021-01-01 ENCOUNTER — Other Ambulatory Visit: Payer: Self-pay

## 2021-01-01 ENCOUNTER — Encounter (HOSPITAL_COMMUNITY): Payer: Self-pay

## 2021-01-01 ENCOUNTER — Emergency Department (HOSPITAL_COMMUNITY)
Admission: EM | Admit: 2021-01-01 | Discharge: 2021-01-01 | Disposition: A | Discharge status: Home / Self Care | Payer: 59 | Attending: Emergency Medicine | Admitting: Emergency Medicine

## 2021-01-01 DIAGNOSIS — R112 Nausea with vomiting, unspecified: Secondary | ICD-10-CM | POA: Diagnosis not present

## 2021-01-01 DIAGNOSIS — R519 Headache, unspecified: Secondary | ICD-10-CM | POA: Diagnosis not present

## 2021-01-01 DIAGNOSIS — B9620 Unspecified Escherichia coli [E. coli] as the cause of diseases classified elsewhere: Secondary | ICD-10-CM | POA: Insufficient documentation

## 2021-01-01 DIAGNOSIS — Z79899 Other long term (current) drug therapy: Secondary | ICD-10-CM | POA: Insufficient documentation

## 2021-01-01 DIAGNOSIS — Z794 Long term (current) use of insulin: Secondary | ICD-10-CM | POA: Insufficient documentation

## 2021-01-01 DIAGNOSIS — Z951 Presence of aortocoronary bypass graft: Secondary | ICD-10-CM | POA: Insufficient documentation

## 2021-01-01 DIAGNOSIS — I10 Essential (primary) hypertension: Secondary | ICD-10-CM | POA: Diagnosis not present

## 2021-01-01 DIAGNOSIS — Z7982 Long term (current) use of aspirin: Secondary | ICD-10-CM | POA: Insufficient documentation

## 2021-01-01 DIAGNOSIS — Z20822 Contact with and (suspected) exposure to covid-19: Secondary | ICD-10-CM | POA: Insufficient documentation

## 2021-01-01 DIAGNOSIS — Z7984 Long term (current) use of oral hypoglycemic drugs: Secondary | ICD-10-CM | POA: Insufficient documentation

## 2021-01-01 DIAGNOSIS — N390 Urinary tract infection, site not specified: Secondary | ICD-10-CM | POA: Diagnosis not present

## 2021-01-01 DIAGNOSIS — R197 Diarrhea, unspecified: Secondary | ICD-10-CM | POA: Insufficient documentation

## 2021-01-01 DIAGNOSIS — Z7902 Long term (current) use of antithrombotics/antiplatelets: Secondary | ICD-10-CM | POA: Diagnosis not present

## 2021-01-01 DIAGNOSIS — Z96652 Presence of left artificial knee joint: Secondary | ICD-10-CM | POA: Insufficient documentation

## 2021-01-01 DIAGNOSIS — E119 Type 2 diabetes mellitus without complications: Secondary | ICD-10-CM | POA: Insufficient documentation

## 2021-01-01 DIAGNOSIS — D72829 Elevated white blood cell count, unspecified: Secondary | ICD-10-CM | POA: Diagnosis not present

## 2021-01-01 DIAGNOSIS — R300 Dysuria: Secondary | ICD-10-CM | POA: Diagnosis present

## 2021-01-01 LAB — URINALYSIS, ROUTINE W REFLEX MICROSCOPIC
Glucose, UA: NEGATIVE mg/dL
Ketones, ur: NEGATIVE mg/dL
Nitrite: NEGATIVE
Protein, ur: 30 mg/dL — AB
Specific Gravity, Urine: 1.013 (ref 1.005–1.030)
WBC, UA: >50 WBC/hpf — ABNORMAL HIGH (ref 0–5)
pH: 5 (ref 5.0–8.0)

## 2021-01-01 LAB — CBC WITH DIFFERENTIAL/PLATELET
Abs Immature Granulocytes: 0.12 10*3/uL — ABNORMAL HIGH (ref 0.00–0.07)
Basophils Absolute: 0 10*3/uL (ref 0.0–0.1)
Basophils Relative: 0 %
Eosinophils Absolute: 0 10*3/uL (ref 0.0–0.5)
Eosinophils Relative: 0 %
HCT: 35.2 % — ABNORMAL LOW (ref 36.0–46.0)
Hemoglobin: 11.2 g/dL — ABNORMAL LOW (ref 12.0–15.0)
Immature Granulocytes: 1 %
Lymphocytes Relative: 9 %
Lymphs Abs: 1.6 10*3/uL (ref 0.7–4.0)
MCH: 26.4 pg (ref 26.0–34.0)
MCHC: 31.8 g/dL (ref 30.0–36.0)
MCV: 83 fL (ref 80.0–100.0)
Monocytes Absolute: 1.8 10*3/uL — ABNORMAL HIGH (ref 0.1–1.0)
Monocytes Relative: 10 %
Neutro Abs: 14.8 10*3/uL — ABNORMAL HIGH (ref 1.7–7.7)
Neutrophils Relative %: 80 %
Platelets: 119 10*3/uL — ABNORMAL LOW (ref 150–400)
RBC: 4.24 MIL/uL (ref 3.87–5.11)
RDW: 15.4 % (ref 11.5–15.5)
WBC: 18.4 10*3/uL — ABNORMAL HIGH (ref 4.0–10.5)
nRBC: 0 % (ref 0.0–0.2)

## 2021-01-01 LAB — COMPREHENSIVE METABOLIC PANEL
ALT: 113 U/L — ABNORMAL HIGH (ref 0–44)
AST: 87 U/L — ABNORMAL HIGH (ref 15–41)
Albumin: 2.5 g/dL — ABNORMAL LOW (ref 3.5–5.0)
Alkaline Phosphatase: 766 U/L — ABNORMAL HIGH (ref 38–126)
Anion gap: 9 (ref 5–15)
BUN: 8 mg/dL (ref 6–20)
CO2: 27 mmol/L (ref 22–32)
Calcium: 8.7 mg/dL — ABNORMAL LOW (ref 8.9–10.3)
Chloride: 96 mmol/L — ABNORMAL LOW (ref 98–111)
Creatinine, Ser: 0.93 mg/dL (ref 0.44–1.00)
GFR, Estimated: >60 mL/min (ref >60)
Glucose, Bld: 230 mg/dL — ABNORMAL HIGH (ref 70–99)
Potassium: 3 mmol/L — ABNORMAL LOW (ref 3.5–5.1)
Sodium: 132 mmol/L — ABNORMAL LOW (ref 135–145)
Total Bilirubin: 4.9 mg/dL — ABNORMAL HIGH (ref 0.3–1.2)
Total Protein: 8.3 g/dL — ABNORMAL HIGH (ref 6.5–8.1)

## 2021-01-01 LAB — RESP PANEL BY RT-PCR (FLU A&B, COVID) ARPGX2
Influenza A by PCR: NEGATIVE
Influenza B by PCR: NEGATIVE
SARS Coronavirus 2 by RT PCR: NEGATIVE

## 2021-01-01 LAB — LIPASE, BLOOD: Lipase: 25 U/L (ref 11–51)

## 2021-01-01 LAB — PREGNANCY, URINE: Preg Test, Ur: NEGATIVE

## 2021-01-01 MED ORDER — SODIUM CHLORIDE 0.9 % IV SOLN
1.0000 g | INTRAVENOUS | Status: DC
  Administered 2021-01-01: 1 g via INTRAVENOUS
  Filled 2021-01-01: qty 10

## 2021-01-01 MED ORDER — LACTATED RINGERS IV SOLN: INTRAVENOUS | Status: DC

## 2021-01-01 MED ORDER — CEPHALEXIN 500 MG PO CAPS: 500.0000 mg | ORAL_CAPSULE | Freq: Four times a day (QID) | ORAL | 0 refills | Status: DC

## 2021-01-01 MED ORDER — LACTATED RINGERS IV BOLUS
2000.0000 mL | Freq: Once | INTRAVENOUS | Status: AC
  Administered 2021-01-01: 2000 mL via INTRAVENOUS

## 2021-01-01 MED ORDER — POTASSIUM CHLORIDE 10 MEQ/100ML IV SOLN
10.0000 meq | Freq: Once | INTRAVENOUS | Status: AC
  Administered 2021-01-01: 10 meq via INTRAVENOUS
  Filled 2021-01-01: qty 100

## 2021-01-01 MED ORDER — ACETAMINOPHEN 325 MG PO TABS
650.0000 mg | ORAL_TABLET | Freq: Once | ORAL | Status: AC
  Administered 2021-01-01: 650 mg via ORAL
  Filled 2021-01-01: qty 2

## 2021-01-01 MED ORDER — ONDANSETRON 4 MG PO TBDP
4.0000 mg | ORAL_TABLET | Freq: Once | ORAL | Status: AC
  Administered 2021-01-01: 4 mg via ORAL
  Filled 2021-01-01: qty 1

## 2021-01-01 NOTE — ED Provider Notes (Signed)
Emergency Medicine Provider Triage Evaluation Note  Nichol Kawa Massoud , a 60 y.o. female  was evaluated in triage.  Pt complains of nausea and vomiting and fever since Wednesday.  She states that she has not been able to tolerate p.o. and has stopped taking her medications as result of this.  She states that she has urinary frequency and dysuria.  States that she has had a fever and chills.  She states she believes she has UTI and her urine has smelled abnormal.  Denies any significant diarrhea.  No chest pain or shortness of breath.  Review of Systems  Positive: Fever, dysuria, abdominal pain Negative: Chest pain  Physical Exam  BP 113/65 (BP Location: Left Arm)   Pulse (!) 106   Temp (!) 101.8 F (38.8 C)   Resp 20   SpO2 96%  Gen:   Awake, no distress   Resp:  Normal effort  MSK:   Moves extremities without difficulty  Other:  No significant abdominal tenderness to palpation of the abdomen  Medical Decision Making  Medically screening exam initiated at 11:10 AM.  Appropriate orders placed.  Allean Found Geske was informed that the remainder of the evaluation will be completed by another provider, this initial triage assessment does not replace that evaluation, and the importance of remaining in the ED until their evaluation is complete.  Abdomen soft, nontender.  Patient is febrile complaining of urinary symptoms will obtain COVID swab, urinalysis, urine culture and basic abdominal labs.  Given Tylenol and Zofran.   Pati Gallo [place], [place] 01/01/21 1111    Lacretia Leigh, MD 01/02/21 0730

## 2021-01-01 NOTE — ED Triage Notes (Signed)
Patient complains of abdominal pain with nausea, vomiting and fever since Wednesday. Patient alert and oriented. Also complains of dysuria

## 2021-01-01 NOTE — ED Notes (Signed)
Dc instructions reviewed with pt. Pt verbalized understanding. PT DC>  

## 2021-01-01 NOTE — ED Provider Notes (Signed)
[facility] EMERGENCY DEPARTMENT Provider Note   CSN: [id number] Arrival date & time: 01/01/21  1047     History No chief complaint on file.   Lisa [name] is a 60 y.o. female.  60 year old female presents with 3 days of nausea vomiting diarrhea along with body aches.  Has noted some dysuria.  Similar to when she has had UTIs in the past.  No cough or congestion.  Some suprapubic tenderness.  Mild headache but no photophobia or neck discomfort.  Unresponsive to home treatment      Past Medical History:  Diagnosis Date   Allergic rhinitis 02/15/2015   Arthritis    BP (high blood pressure) 02/15/2015   Cervical lymphadenopathy    Cirrhosis ([place])    Diabetes mellitus    Elevated liver enzymes 09/23/2015   Elevated liver enzymes 09/23/2015   GERD (gastroesophageal reflux disease)    Gonalgia 02/15/2015   Hernia, lateral ventral 11/19/2012   Hypertension    Intra-abdominal and pelvic swelling, mass and lump, unspecified site 11/19/2012   Lymphadenopathy 03/18/2017    Patient Active Problem List   Diagnosis Date Noted   S/P CABG x 3 08/26/2020   Atherosclerosis of aorta ([place]) 07/18/2019   Varicose veins of both lower extremities with pain 01/14/2018   Bilateral lower extremity edema 01/14/2018   S/P drug eluting coronary stent placement 09/13/2016   NASH (nonalcoholic steatohepatitis) 03/13/2016   Primary biliary cholangitis ([place]) 01/14/2016   Elevated liver enzymes 09/23/2015   Adaptation reaction 02/15/2015   Allergic rhinitis 02/15/2015   Diabetes mellitus, type 2 ([place]) 02/15/2015   Foot pain 02/15/2015   Big thyroid 02/15/2015   BP (high blood pressure) 02/15/2015   LBP (low back pain) 02/15/2015   Hernia of anterior abdominal wall 02/15/2015   Dermatophytosis of groin 02/15/2015   Candida vaginitis 02/15/2015   Acid reflux 11/03/2014   Hyperlipidemia due to type 2 diabetes mellitus ([place]) 03/17/2014   S/P TKR (total knee replacement)  07/28/2013   Hernia, lateral ventral 11/19/2012   Adnexal mass 11/06/2012    Past Surgical History:  Procedure Laterality Date   ABDOMINAL HYSTERECTOMY     2009   BREAST BIOPSY     CHOLECYSTECTOMY  1997   COLONOSCOPY WITH PROPOFOL N/A 01/21/2018   Procedure: COLONOSCOPY WITH PROPOFOL;  Surgeon: Jonathon Bellows, MD;  Location: [facility] ENDOSCOPY;  Service: Gastroenterology;  Laterality: N/A;   CORONARY ANGIOPLASTY     stints   CORONARY STENT INTERVENTION N/A 09/13/2016   Procedure: Coronary Stent Intervention;  Surgeon: Yolonda Kida, MD;  Location: [facility];  Service: Cardiovascular;  Laterality: N/A;   GALLBLADDER SURGERY     HERNIA REPAIR  01/20/7828   periumbilical with right ooph   JOINT REPLACEMENT     left knee   KNEE SURGERY  12/2011   total knee replacement Dr. Duayne Cal   LEFT HEART CATH AND CORONARY ANGIOGRAPHY N/A 09/13/2016   Procedure: Left Heart Cath and Coronary Angiography;  Surgeon: Yolonda Kida, MD;  Location: [facility];  Service: Cardiovascular;  Laterality: N/A;   LEFT HEART CATH AND CORONARY ANGIOGRAPHY Left 08/11/2020   Procedure: LEFT HEART CATH AND CORONARY ANGIOGRAPHY;  Surgeon: Yolonda Kida, MD;  Location: [facility];  Service: Cardiovascular;  Laterality: Left;   LYMPH GLAND EXCISION N/A 06/04/2017   Procedure: CERVICAL LYMPH NODE BIOPSY;  Surgeon: Vickie Epley, MD;  Location: [facility] ORS;  Service: General;  Laterality: N/A;   TUBAL LIGATION  OB History   No obstetric history on file.     Family History  Problem Relation Age of Onset   Diabetes Sister    Diabetes Sister    Diabetes Sister    Diabetes Mother    Heart disease Father     Social History   Tobacco Use   Smoking status: Never   Smokeless tobacco: Never  Vaping Use   Vaping Use: Never used  Substance Use Topics   Alcohol use: No   Drug use: No    Home Medications Prior to Admission medications   Medication Sig Start Date End Date  Taking? Authorizing Provider  aspirin 81 MG tablet Take 1 tablet by mouth daily.    [provider]  atorvastatin (LIPITOR) 80 MG tablet Take 1 tablet (80 mg total) by mouth daily at 6 PM. 09/25/17   Bacigalupo, Dionne Bucy, MD  Blood Glucose Monitoring Suppl (FIFTY50 GLUCOSE METER 2.0) W/DEVICE KIT Use as directed. 03/17/14   [provider]  clopidogrel (PLAVIX) 75 MG tablet Take 75 mg by mouth daily. Patient not taking: Reported on 08/11/2020    [provider]  glyBURIDE (DIABETA) 2.5 MG tablet Take 1 tablet by mouth daily. Patient not taking: Reported on 08/11/2020 10/08/13   [provider]  insulin glargine (LANTUS) 100 UNIT/ML injection Inject 50 Units into the skin daily.    [provider]  JARDIANCE 25 MG TABS tablet Take 25 mg daily by mouth. Patient not taking: Reported on 08/11/2020 02/17/17   [provider]  lisinopril (ZESTRIL) 5 MG tablet Take 5 mg by mouth daily. 06/14/20   [provider]  metoprolol succinate (TOPROL-XL) 25 MG 24 hr tablet Take 25 mg by mouth daily. 06/14/20   [provider]  metoprolol tartrate (LOPRESSOR) 25 MG tablet Take by mouth. 08/31/20   [provider]  MULTIPLE VITAMINS-MINERALS PO Take 1 tablet by mouth daily.    [provider]  omeprazole (PRILOSEC) 40 MG capsule TAKE 1 CAPSULE BY MOUTH  DAILY 10/11/20   Bacigalupo, Dionne Bucy, MD  ondansetron (ZOFRAN) 4 MG tablet Take 1 tablet (4 mg total) by mouth every 8 (eight) hours as needed for nausea or vomiting. Patient not taking: Reported on 07/29/2020 03/13/18   Virginia Crews, MD  sulfamethoxazole-trimethoprim (BACTRIM DS) 800-160 MG tablet Take 1 tablet by mouth 2 (two) times daily. Patient not taking: Reported on 08/11/2020 07/29/20   Flinchum, Kelby Aline, FNP  terconazole (TERAZOL 7) 0.4 % vaginal cream Place 1 applicator vaginally at bedtime. 07/29/20   Flinchum, Kelby Aline, FNP  ursodiol (ACTIGALL) 300 MG capsule Take 300 mg  by mouth daily.     [provider]    Allergies    Amoxicillin-pot clavulanate, Ciprofloxacin, and Clarithromycin  Review of Systems   Review of Systems  All other systems reviewed and are negative.  Physical Exam Updated Vital Signs BP 104/66   Pulse 99   Temp (!) 101.8 F (38.8 C)   Resp 20   SpO2 95%   Physical Exam Vitals and nursing note reviewed.  Constitutional:      General: She is not in acute distress.    Appearance: Normal appearance. She is well-developed. She is not toxic-appearing.  HENT:     Head: Normocephalic and atraumatic.  Eyes:     General: Lids are normal.     Conjunctiva/sclera: Conjunctivae normal.     Pupils: Pupils are equal, round, and reactive to light.  Neck:  Thyroid: No thyroid mass.     Trachea: No tracheal deviation.  Cardiovascular:     Rate and Rhythm: Normal rate and regular rhythm.     Heart sounds: Normal heart sounds. No murmur heard.   No gallop.  Pulmonary:     Effort: Pulmonary effort is normal. No respiratory distress.     Breath sounds: Normal breath sounds. No stridor. No decreased breath sounds, wheezing, rhonchi or rales.  Abdominal:     General: There is no distension.     Palpations: Abdomen is soft.     Tenderness: There is abdominal tenderness in the suprapubic area. There is no rebound.  Musculoskeletal:        General: No tenderness. Normal range of motion.     Cervical back: Normal range of motion and neck supple.  Skin:    General: Skin is warm and dry.     Findings: No abrasion or rash.  Neurological:     Mental Status: She is alert and oriented to person, place, and time. Mental status is at baseline.     GCS: GCS eye subscore is 4. GCS verbal subscore is 5. GCS motor subscore is 6.     Cranial Nerves: Cranial nerves are intact. No cranial nerve deficit.     Sensory: No sensory deficit.     Motor: Motor function is intact.  Psychiatric:        Attention and Perception: Attention normal.         Speech: Speech normal.        Behavior: Behavior normal.    ED Results / Procedures / Treatments   Labs (all labs ordered are listed, but only abnormal results are displayed) Labs Reviewed  CBC WITH DIFFERENTIAL/PLATELET - Abnormal; Notable for the following components:      Result Value   WBC 18.4 (*)    Hemoglobin 11.2 (*)    HCT 35.2 (*)    Platelets 119 (*)    Neutro Abs 14.8 (*)    Monocytes Absolute 1.8 (*)    Abs Immature Granulocytes 0.12 (*)    All other components within normal limits  COMPREHENSIVE METABOLIC PANEL - Abnormal; Notable for the following components:   Sodium 132 (*)    Potassium 3.0 (*)    Chloride 96 (*)    Glucose, Bld 230 (*)    Calcium 8.7 (*)    Total Protein 8.3 (*)    Albumin 2.5 (*)    AST 87 (*)    ALT 113 (*)    Alkaline Phosphatase 766 (*)    Total Bilirubin 4.9 (*)    All other components within normal limits  RESP PANEL BY RT-PCR (FLU A&B, COVID) ARPGX2  URINE CULTURE  LIPASE, BLOOD  URINALYSIS, ROUTINE W REFLEX MICROSCOPIC  PREGNANCY, URINE    EKG None  Radiology No results found.  Procedures Procedures   Medications Ordered in ED Medications  lactated ringers infusion (has no administration in time range)  lactated ringers bolus 2,000 mL (has no administration in time range)  potassium chloride 10 mEq in 100 mL IVPB (has no administration in time range)  acetaminophen (TYLENOL) tablet 650 mg (650 mg Oral Given 01/01/21 1120)  ondansetron (ZOFRAN-ODT) disintegrating tablet 4 mg (4 mg Oral Given 01/01/21 1122)    ED Course  I have reviewed the triage vital signs and the nursing notes.  Pertinent labs & imaging results that were available during my care of the patient were reviewed by me and considered in  my medical decision making (see chart for details).    MDM Rules/Calculators/A&P                          COVID test negative.  Mild leukocytosis noted on CBC. Patient has evidence of UTI on her urinalysis.   Given Rocephin here.  She feels better after fluids.  Will discharge home on Keflex Final Clinical Impression(s) / ED Diagnoses Final diagnoses:  None    Rx / DC Orders ED Discharge Orders     None        Lacretia Leigh, MD 01/01/21 1526

## 2021-01-03 ENCOUNTER — Ambulatory Visit: Payer: 59

## 2021-01-03 LAB — URINE CULTURE: Culture: >=100000 — AB

## 2021-01-04 ENCOUNTER — Telehealth: Payer: Self-pay

## 2021-01-04 NOTE — Telephone Encounter (Signed)
Post ED Visit - Positive Culture Follow-up  Culture report reviewed by antimicrobial stewardship pharmacist: [facility] Team '[x]'$  Marko Plume, Pharm.D. '[]'$  Heide Guile, Pharm.D., BCPS AQ-ID '[]'$  Parks Neptune, Pharm.D., BCPS '[]'$  Alycia Rossetti, Pharm.D., BCPS '[]'$  [place], Pharm.D., BCPS, AAHIVP '[]'$  Legrand Como, Pharm.D., BCPS, AAHIVP '[]'$  Salome Arnt, PharmD, BCPS '[]'$  Johnnette Gourd, PharmD, BCPS '[]'$  Hughes Better, PharmD, BCPS '[]'$  Leeroy Cha, PharmD '[]'$  Laqueta Linden, PharmD, BCPS '[]'$  Albertina Parr, PharmD  [place] Team '[]'$  Leodis Sias, PharmD '[]'$  Lindell Spar, PharmD '[]'$  Royetta Asal, PharmD '[]'$  Graylin Shiver, Rph '[]'$  Rema Fendt) Glennon Mac, PharmD '[]'$  Arlyn Dunning, PharmD '[]'$  Netta Cedars, PharmD '[]'$  Dia Sitter, PharmD '[]'$  Leone Haven, PharmD '[]'$  Gretta Arab, PharmD '[]'$  Theodis Shove, PharmD '[]'$  Peggyann Juba, PharmD '[]'$  Reuel Boom, PharmD   Positive urine culture Treated with Cephalexin, organism sensitive to the same and no further patient follow-up is required at this time.  Glennon Hamilton 01/04/2021, 10:26 AM

## 2021-01-05 ENCOUNTER — Ambulatory Visit: Payer: 59

## 2021-01-06 ENCOUNTER — Ambulatory Visit: Payer: Self-pay | Admitting: *Deleted

## 2021-01-06 MED ORDER — NITROFURANTOIN MONOHYD MACRO 100 MG PO CAPS: 100.0000 mg | ORAL_CAPSULE | Freq: Two times a day (BID) | ORAL | 0 refills | Status: DC

## 2021-01-06 NOTE — Telephone Encounter (Signed)
Patient states she went to emergency room for a UTI. The medication they gave her she was allergic to and she started to itch. Se did not take the medication anymore after that on time. Wanted appt, but could not help her.   Called patient to review symptoms. No answer, left voicemail to call back .

## 2021-01-06 NOTE — Telephone Encounter (Signed)
Pt reports seen in ED 01/01/21, UTI. Cephalexin prescribed, took first dose "One tablet" on Sunday 01/02/21. Developed itching "All over." Denies rash. Also reports "Stomach felt heavy, bloated." States has not taken med since Sunday. Reports still with pain with urination, "No burning though."  Questioning if different antibiotic should be ordered.  Assured pt NT would route to practice for PCPs review and final disposition.  [phone]# [phone]     Reason for Disposition  [1] Caller has medicine question about med NOT prescribed by PCP AND [2] triager unable to answer question (e.g., compatibility with other med, storage)  Answer Assessment - Initial Assessment Questions 1. NAME of MEDICATION: "What medicine are you calling about?"     Cephalexin 2. QUESTION: "What is your question?" (e.g., double dose of medicine, side effect)     *No Answer* 3. PRESCRIBING HCP: "Who prescribed it?" Reason: if prescribed by specialist, call should be referred to that group.     ED 4. SYMPTOMS: "Do you have any symptoms?"     Itchy, bloated 5. SEVERITY: If symptoms are present, ask "Are they mild, moderate or severe?"  Protocols used: Medication Question Call-A-AH

## 2021-01-06 NOTE — Telephone Encounter (Signed)
Second attempted to reach patient- no answer- mailbox is full- unable to leave message.Alternative home number- rings- no answer.

## 2021-01-06 NOTE — Telephone Encounter (Signed)
Ok to switch to [employer] '100mg'$  BID x5d

## 2021-01-06 NOTE — Telephone Encounter (Signed)
Patient advised.

## 2021-01-07 ENCOUNTER — Ambulatory Visit: Payer: Self-pay | Admitting: *Deleted

## 2021-01-07 ENCOUNTER — Telehealth: Payer: Self-pay | Admitting: Family Medicine

## 2021-01-07 ENCOUNTER — Encounter: Payer: Self-pay | Admitting: *Deleted

## 2021-01-07 DIAGNOSIS — Z951 Presence of aortocoronary bypass graft: Secondary | ICD-10-CM

## 2021-01-07 MED ORDER — NITROFURANTOIN MONOHYD MACRO 100 MG PO CAPS: 100.0000 mg | ORAL_CAPSULE | Freq: Two times a day (BID) | ORAL | 0 refills | Status: DC

## 2021-01-07 NOTE — Telephone Encounter (Signed)
Patient called and reports medication Macrobid is not at [place] as requested per patient. Medication is not at [facility] per [facility]. Please advise . Patient is very upset and reports she needs the medication today.

## 2021-01-07 NOTE — Progress Notes (Signed)
Cardiac Individual Treatment Plan  Patient Details  Name: Lisa [name] MRN: [id number] Date of Birth: 07-16-1960 Referring Provider:   Flowsheet Row Cardiac Rehab from 10/06/2020 in [facility] Cardiac and Pulmonary Rehab  Referring Provider [name] MD       Initial Encounter Date:  Flowsheet Row Cardiac Rehab from 10/06/2020 in [facility] Cardiac and Pulmonary Rehab  Date 10/06/20       Visit Diagnosis: S/P CABG x 3  Patient's Home Medications on Admission:  Current Outpatient Medications:    aspirin 81 MG tablet, Take 1 tablet by mouth daily., Disp: , Rfl:    atorvastatin (LIPITOR) 80 MG tablet, Take 1 tablet (80 mg total) by mouth daily at 6 PM., Disp: 90 tablet, Rfl: 3   Blood Glucose Monitoring Suppl (FIFTY50 GLUCOSE METER 2.0) W/DEVICE KIT, Use as directed., Disp: , Rfl:    cephALEXin (KEFLEX) 500 MG capsule, Take 1 capsule (500 mg total) by mouth 4 (four) times daily., Disp: 20 capsule, Rfl: 0   clopidogrel (PLAVIX) 75 MG tablet, Take 75 mg by mouth daily. (Patient not taking: Reported on 08/11/2020), Disp: , Rfl:    glyBURIDE (DIABETA) 2.5 MG tablet, Take 1 tablet by mouth daily. (Patient not taking: Reported on 08/11/2020), Disp: , Rfl:    insulin glargine (LANTUS) 100 UNIT/ML injection, Inject 50 Units into the skin daily., Disp: , Rfl:    JARDIANCE 25 MG TABS tablet, Take 25 mg daily by mouth. (Patient not taking: Reported on 08/11/2020), Disp: , Rfl: 6   lisinopril (ZESTRIL) 5 MG tablet, Take 5 mg by mouth daily., Disp: , Rfl:    metoprolol succinate (TOPROL-XL) 25 MG 24 hr tablet, Take 25 mg by mouth daily., Disp: , Rfl:    metoprolol tartrate (LOPRESSOR) 25 MG tablet, Take by mouth., Disp: , Rfl:    MULTIPLE VITAMINS-MINERALS PO, Take 1 tablet by mouth daily., Disp: , Rfl:    nitrofurantoin, macrocrystal-monohydrate, (MACROBID) 100 MG capsule, Take 1 capsule (100 mg total) by mouth 2 (two) times daily., Disp: 10 capsule, Rfl: 0   omeprazole (PRILOSEC) 40 MG capsule, TAKE  1 CAPSULE BY MOUTH  DAILY, Disp: 90 capsule, Rfl: 3   ondansetron (ZOFRAN) 4 MG tablet, Take 1 tablet (4 mg total) by mouth every 8 (eight) hours as needed for nausea or vomiting. (Patient not taking: Reported on 07/29/2020), Disp: 30 tablet, Rfl: 0   sulfamethoxazole-trimethoprim (BACTRIM DS) 800-160 MG tablet, Take 1 tablet by mouth 2 (two) times daily. (Patient not taking: Reported on 08/11/2020), Disp: 6 tablet, Rfl: 0   terconazole (TERAZOL 7) 0.4 % vaginal cream, Place 1 applicator vaginally at bedtime., Disp: 45 g, Rfl: 0   ursodiol (ACTIGALL) 300 MG capsule, Take 300 mg by mouth daily. , Disp: , Rfl:   Past Medical History: Past Medical History:  Diagnosis Date   Allergic rhinitis 02/15/2015   Arthritis    BP (high blood pressure) 02/15/2015   Cervical lymphadenopathy    Cirrhosis (Lupus)    Diabetes mellitus    Elevated liver enzymes 09/23/2015   Elevated liver enzymes 09/23/2015   GERD (gastroesophageal reflux disease)    Gonalgia 02/15/2015   Hernia, lateral ventral 11/19/2012   Hypertension    Intra-abdominal and pelvic swelling, mass and lump, unspecified site 11/19/2012   Lymphadenopathy 03/18/2017    Tobacco Use: Social History   Tobacco Use  Smoking Status Never  Smokeless Tobacco Never    Labs: Recent Review Flowsheet Data     Labs for ITP Cardiac and Pulmonary  Rehab Latest Ref Rng & Units 09/22/2015 05/22/2017 12/26/2017 11/05/2018 06/04/2019   Cholestrol 0 - 200 305(H) - 359(H) 355(A) -   LDLCALC - 205(H) - 211(H) 207 -   HDL 35 - 70 84 - 127 133(A) -   Trlycerides 40 - 160 82 - 106 74 -   Hemoglobin A1c - - 8.3 12.1(H) - 10.5        Exercise Target Goals: Exercise Program Goal: Individual exercise prescription set using results from initial 6 min walk test and THRR while considering  patient's activity barriers and safety.   Exercise Prescription Goal: Initial exercise prescription builds to 30-45 minutes a day of aerobic activity, 2-3 days per week.  Home  exercise guidelines will be given to patient during program as part of exercise prescription that the participant will acknowledge.   Education: Aerobic Exercise: - Group verbal and visual presentation on the components of exercise prescription. Introduces F.I.T.T principle from ACSM for exercise prescriptions.  Reviews F.I.T.T. principles of aerobic exercise including progression. Written material given at graduation. Flowsheet Row Cardiac Rehab from 11/24/2020 in [facility] Cardiac and Pulmonary Rehab  Date 10/27/20  Educator [facility]  Instruction Review Code 1- Verbalizes Understanding       Education: Resistance Exercise: - Group verbal and visual presentation on the components of exercise prescription. Introduces F.I.T.T principle from ACSM for exercise prescriptions  Reviews F.I.T.T. principles of resistance exercise including progression. Written material given at graduation. Flowsheet Row Cardiac Rehab from 11/24/2020 in [facility] Cardiac and Pulmonary Rehab  Date 11/03/20  Educator [facility]  Instruction Review Code 1- Verbalizes Understanding        Education: Exercise & Equipment Safety: - Individual verbal instruction and demonstration of equipment use and safety with use of the equipment. Flowsheet Row Cardiac Rehab from 11/24/2020 in [facility] Cardiac and Pulmonary Rehab  Date 10/06/20  Educator [facility]  Instruction Review Code 1- Verbalizes Understanding       Education: Exercise Physiology & General Exercise Guidelines: - Group verbal and written instruction with models to review the exercise physiology of the cardiovascular system and associated critical values. Provides general exercise guidelines with specific guidelines to those with heart or lung disease.    Education: Flexibility, Balance, Mind/Body Relaxation: - Group verbal and visual presentation with interactive activity on the components of exercise prescription. Introduces F.I.T.T principle from ACSM for exercise prescriptions. Reviews  F.I.T.T. principles of flexibility and balance exercise training including progression. Also discusses the mind body connection.  Reviews various relaxation techniques to help reduce and manage stress (i.e. Deep breathing, progressive muscle relaxation, and visualization). Balance handout provided to take home. Written material given at graduation. Flowsheet Row Cardiac Rehab from 11/24/2020 in [facility] Cardiac and Pulmonary Rehab  Date 11/10/20  Educator [facility]  Instruction Review Code 1- Verbalizes Understanding       Activity Barriers & Risk Stratification:  Activity Barriers & Cardiac Risk Stratification - 10/06/20 1534       Activity Barriers & Cardiac Risk Stratification   Activity Barriers Incisional Pain;Left Knee Replacement;Muscular Weakness;Shortness of Breath;Deconditioning;Balance Concerns    Cardiac Risk Stratification High             6 Minute Walk:  6 Minute Walk     Row Name 10/06/20 1533         6 Minute Walk   Phase Initial     Distance 890 feet     Walk Time 6 minutes     # of Rest Breaks 0  MPH 1.68     METS 2.76     RPE 11     VO2 Peak 9.67     Symptoms No     Resting HR 87 bpm     Resting BP 128/62     Resting Oxygen Saturation  98 %     Exercise Oxygen Saturation  during 6 min walk 94 %     Max Ex. HR 98 bpm     Max Ex. BP 134/72     2 Minute Post BP 126/64              Oxygen Initial Assessment:   Oxygen Re-Evaluation:   Oxygen Discharge (Final Oxygen Re-Evaluation):   Initial Exercise Prescription:  Initial Exercise Prescription - 10/06/20 1500       Date of Initial Exercise RX and Referring Provider   Date 10/06/20    Referring Provider [name] MD      Treadmill   MPH 1.6    Grade 0.5    Minutes 15    METs 2.32      Recumbant Bike   Level 1    RPM 50    Watts 22    Minutes 15    METs 2      REL-XR   Level 1    Speed 50    Minutes 15    METs 2      Prescription Details   Frequency (times per week)  3    Duration Progress to 30 minutes of continuous aerobic without signs/symptoms of physical distress      Intensity   THRR 40-80% of Max Heartrate 117-146    Ratings of Perceived Exertion 11-13    Perceived Dyspnea 0-4      Progression   Progression Continue to progress workloads to maintain intensity without signs/symptoms of physical distress.      Resistance Training   Training Prescription Yes    Weight 3 lb    Reps 10-15             Perform Capillary Blood Glucose checks as needed.  Exercise Prescription Changes:   Exercise Prescription Changes     Row Name 10/06/20 1500 10/20/20 0900 11/01/20 1400 11/16/20 1500 11/29/20 1600     Response to Exercise   Blood Pressure (Admit) 128/62 122/70 110/68 110/80 122/62   Blood Pressure (Exercise) 134/72 146/78 162/80 118/68 --   Blood Pressure (Exit) 126/64 112/68 112/62 110/80 118/62   Heart Rate (Admit) 87 bpm 79 bpm 60 bpm 84 bpm 88 bpm   Heart Rate (Exercise) 98 bpm 101 bpm 110 bpm 107 bpm 132 bpm   Heart Rate (Exit) 88 bpm 89 bpm 77 bpm 88 bpm 81 bpm   Oxygen Saturation (Admit) 98 % -- -- -- --   Oxygen Saturation (Exercise) 94 % -- -- -- --   Rating of Perceived Exertion (Exercise) 11 6 14 17 15    Symptoms none none none none none   Comments walk test results first day -- -- --   Duration -- -- Continue with 30 min of aerobic exercise without signs/symptoms of physical distress. Continue with 30 min of aerobic exercise without signs/symptoms of physical distress. Continue with 30 min of aerobic exercise without signs/symptoms of physical distress.   Intensity -- -- THRR unchanged THRR unchanged THRR unchanged     Progression   Progression -- -- Continue to progress workloads to maintain intensity without signs/symptoms of physical distress. Continue to progress workloads to maintain  intensity without signs/symptoms of physical distress. Continue to progress workloads to maintain intensity without signs/symptoms of  physical distress.   Average METs -- -- 2.7 2.77 3.16     Resistance Training   Training Prescription -- Yes Yes Yes Yes   Weight -- 3 lb 3 lb 3 lb 3 lb   Reps -- 10-15 10-15 10-15 10-15     Interval Training   Interval Training -- -- No No No     Treadmill   MPH -- -- -- 2.5 2.5   Grade -- -- -- 1 1   Minutes -- -- -- 15 15   METs -- -- -- 3.26 3.26     Recumbant Bike   Level -- -- 3 3 4    Watts -- -- 23 25 30    Minutes -- -- 15 15 15    METs -- -- 2.87 -- 3.19     REL-XR   Level -- 1 1 1  --   Minutes -- 15 15 15  --   METs -- 2 2.6 2.7 --     Track   Laps -- -- -- 25 18   Minutes -- -- -- 15 15   METs -- -- -- 2.36 1.98    Row Name 12/15/20 1300             Response to Exercise   Blood Pressure (Admit) 124/78       Blood Pressure (Exit) 110/70       Heart Rate (Admit) 87 bpm       Heart Rate (Exercise) 107 bpm       Heart Rate (Exit) 80 bpm       Rating of Perceived Exertion (Exercise) 16       Symptoms none       Duration Continue with 30 min of aerobic exercise without signs/symptoms of physical distress.       Intensity THRR unchanged               Progression   Progression Continue to progress workloads to maintain intensity without signs/symptoms of physical distress.       Average METs 2.63               Resistance Training   Training Prescription Yes       Weight 3 lb       Reps 10-15                Exercise Comments:   Exercise Comments     Row Name 10/18/20 1416           Exercise Comments First full day of exercise!  Patient was oriented to gym and equipment including functions, settings, policies, and procedures.  Patient's individual exercise prescription and treatment plan were reviewed.  All starting workloads were established based on the results of the 6 minute walk test done at initial orientation visit.  The plan for exercise progression was also introduced and progression will be customized based on patient's performance  and goals.                Exercise Goals and Review:   Exercise Goals     Row Name 10/06/20 1536             Exercise Goals   Increase Physical Activity Yes       Intervention Provide advice, education, support and counseling about physical activity/exercise needs.;Develop an individualized exercise prescription for aerobic and resistive training based on initial evaluation  findings, risk stratification, comorbidities and participant's personal goals.       Expected Outcomes Short Term: Attend rehab on a regular basis to increase amount of physical activity.;Long Term: Exercising regularly at least 3-5 days a week.;Long Term: Add in home exercise to make exercise part of routine and to increase amount of physical activity.       Increase Strength and Stamina Yes       Intervention Provide advice, education, support and counseling about physical activity/exercise needs.;Develop an individualized exercise prescription for aerobic and resistive training based on initial evaluation findings, risk stratification, comorbidities and participant's personal goals.       Expected Outcomes Short Term: Increase workloads from initial exercise prescription for resistance, speed, and METs.;Short Term: Perform resistance training exercises routinely during rehab and add in resistance training at home;Long Term: Improve cardiorespiratory fitness, muscular endurance and strength as measured by increased METs and functional capacity (6MWT)       Able to understand and use rate of perceived exertion (RPE) scale Yes       Intervention Provide education and explanation on how to use RPE scale       Expected Outcomes Short Term: Able to use RPE daily in rehab to express subjective intensity level;Long Term:  Able to use RPE to guide intensity level when exercising independently       Able to understand and use Dyspnea scale Yes       Intervention Provide education and explanation on how to use Dyspnea scale        Expected Outcomes Long Term: Able to use Dyspnea scale to guide intensity level when exercising independently;Short Term: Able to use Dyspnea scale daily in rehab to express subjective sense of shortness of breath during exertion       Knowledge and understanding of Target Heart Rate Range (THRR) Yes       Intervention Provide education and explanation of THRR including how the numbers were predicted and where they are located for reference       Expected Outcomes Short Term: Able to state/look up THRR;Short Term: Able to use daily as guideline for intensity in rehab;Long Term: Able to use THRR to govern intensity when exercising independently       Able to check pulse independently Yes       Intervention Provide education and demonstration on how to check pulse in carotid and radial arteries.;Review the importance of being able to check your own pulse for safety during independent exercise       Expected Outcomes Short Term: Able to explain why pulse checking is important during independent exercise;Long Term: Able to check pulse independently and accurately       Understanding of Exercise Prescription Yes       Intervention Provide education, explanation, and written materials on patient's individual exercise prescription       Expected Outcomes Short Term: Able to explain program exercise prescription;Long Term: Able to explain home exercise prescription to exercise independently                Exercise Goals Re-Evaluation :  Exercise Goals Re-Evaluation     Row Name 10/18/20 1416 11/01/20 1452 11/16/20 1535 11/29/20 1013 12/15/20 1356     Exercise Goal Re-Evaluation   Exercise Goals Review Able to understand and use rate of perceived exertion (RPE) scale;Able to understand and use Dyspnea scale;Knowledge and understanding of Target Heart Rate Range (THRR);Understanding of Exercise Prescription Increase Physical Activity;Increase Strength and Stamina;Understanding of Exercise  Prescription Increase Physical Activity;Increase Strength and Stamina;Understanding of Exercise Prescription Increase Physical Activity;Increase Strength and Stamina;Understanding of Exercise Prescription Increase Physical Activity;Increase Strength and Stamina   Comments Reviewed RPE and dyspnea scales, THR and program prescription with pt today.  Pt voiced understanding and was given a copy of goals to take home. Lisa [name] is doing well in rehab.  She is up to 30 laps on the track now.  She is also doing 23 watts on the bike.  We will continue to monitor her progress. Lisa [name] is continuing to do well. She has increased to a speed of 2.5 and 1% incline on the treadmill but is still maintaining the same number of laps on the track. Will continue to monitor. Lisa [name] reports that she is feeling stronger and is walking and doing weights at home on days that she does not have rehab. She is now back to work and was able to do yard work this weekend. She reports improvement in her SOB and is motivated to continue her exercise routine even though her schedule has changed since going back to work. Lisa [name] has not attended consistently the past couple weeks.  She works at [employer].  Staff will encourage her to increase weight for strength work.   Expected Outcomes Short: Use RPE daily to regulate intensity. Long: Follow program prescription in THR. Short: Continue to attend rehab regularly Long: Conitnue to follow program prescription Short: Increase number of laps on track Long: Continue to increase strength and stamina Short: Continue to exercise 1-2 days per week outside of rehab. Long: Continue to increase strength and stamina Short: get back to consistent attendance Long: improve overall stamina    Row Name 12/27/20 1614             Exercise Goal Re-Evaluation   Comments Out since last review                Discharge Exercise Prescription (Final Exercise Prescription Changes):  Exercise Prescription Changes -  12/15/20 1300       Response to Exercise   Blood Pressure (Admit) 124/78    Blood Pressure (Exit) 110/70    Heart Rate (Admit) 87 bpm    Heart Rate (Exercise) 107 bpm    Heart Rate (Exit) 80 bpm    Rating of Perceived Exertion (Exercise) 16    Symptoms none    Duration Continue with 30 min of aerobic exercise without signs/symptoms of physical distress.    Intensity THRR unchanged      Progression   Progression Continue to progress workloads to maintain intensity without signs/symptoms of physical distress.    Average METs 2.63      Resistance Training   Training Prescription Yes    Weight 3 lb    Reps 10-15             Nutrition:  Target Goals: Understanding of nutrition guidelines, daily intake of sodium <1538m, cholesterol <2031m calories 30% from fat and 7% or less from saturated fats, daily to have 5 or more servings of fruits and vegetables.  Education: All About Nutrition: -Group instruction provided by verbal, written material, interactive activities, discussions, models, and posters to present general guidelines for heart healthy nutrition including fat, fiber, MyPlate, the role of sodium in heart healthy nutrition, utilization of the nutrition label, and utilization of this knowledge for meal planning. Follow up email sent as well. Written material given at graduation. [facility]  Education need identified 10/06/20       Biometrics:  Pre Biometrics - 10/06/20 1537       Pre Biometrics   Height 5' 10"  (1.778 m)    Weight 199 lb 9.6 oz (90.5 kg)    BMI (Calculated) 28.64    Single Leg Stand 7.1 seconds              Nutrition Therapy Plan and Nutrition Goals:  Nutrition Therapy & Goals - 11/04/20 0851       Nutrition Therapy   Diet Heart healthy, low Na, diabetes friendly    Drug/Food Interactions Statins/Certain Fruits    Protein (specify units) 70g    Fiber 25 grams     Whole Grain Foods 3 servings    Saturated Fats 12 max. grams    Fruits and Vegetables 8 servings/day    Sodium 1.5 grams      Personal Nutrition Goals   Nutrition Goal ST: add protein/fat to fruit at 3am 3rd shift snack: nuts/seeds, greek yogurt, a boiled egg or cubes of cheese  LT: Use MyPlate structure when eating meals and continue to enjoy the meals she is used to    Comments PYP score was 83. She reports trying hard to eat well and is now boiling plaintains instead of frying. She is from the [place]; her curry chicken and gravy is low in saturated fat, if lowered salt in recipe would be considered heart healthy - she was nervous she would no longer be able to eat that anymore. She reports loving her rice and collards. Discussed MyPlate structure for meal planning as well as heart healthy and diabetes frienly eating. She works 3rd shift and has a [employer] of fruit at [place], but feels like its hard to hold out until breakfast, discussed adding protein and fat like nuts/seeds, greek yogurt, a boiled egg or cubes of cheese. She would like to try this out.      Intervention Plan   Intervention Prescribe, educate and counsel regarding individualized specific dietary modifications aiming towards targeted core components such as weight, hypertension, lipid management, diabetes, heart failure and other comorbidities.    Expected Outcomes Short Term Goal: Understand basic principles of dietary content, such as calories, fat, sodium, cholesterol and nutrients.;Short Term Goal: A plan has been developed with personal nutrition goals set during dietitian appointment.;Long Term Goal: Adherence to prescribed nutrition plan.             Nutrition Assessments:  MEDIFICTS Score Key: ?70 Need to make dietary changes  40-70 Heart Healthy Diet ? 40 Therapeutic Level Cholesterol Diet  Flowsheet Row Cardiac Rehab from 10/06/2020 in [facility] Cardiac and Pulmonary Rehab  Picture Your Plate Total Score on  Admission 83      Picture Your Plate Scores: <03 Unhealthy dietary pattern with much room for improvement. 41-50 Dietary pattern unlikely to meet recommendations for good health and room for improvement. 51-60 More healthful dietary pattern, with some room for improvement.  >60 Healthy dietary pattern, although there may be some specific behaviors that could be improved.    Nutrition Goals Re-Evaluation:  Nutrition Goals Re-Evaluation     Lisa [name] Name 11/29/20 1003             Goals   Nutrition Goal ST: add protein/fat to fruit at 3am 3rd shift snack: nuts/seeds, greek yogurt, a boiled egg or cubes of cheese  LT: Use MyPlate structure when eating meals and continue to enjoy the meals she is used  to       Comment [place] has reported that she is working hard in [employer] social [employer]. She has cut out pasta and is trying to reduce the amount of bread in her diet. She has added greek yoghurt and nuts to her snack at work during her night shift to increas her protein intake.       Expected Outcome Short: work on reducing bread portions Long: maintain heart healthy balanced diet.                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 She has cut out pasta and is trying to reduce the amount of bread in her diet. She has added greek yoghurt and nuts to her snack at work during her night shift to increas her protein intake.    Expected Outcome Short: work on reducing bread portions Long: maintain heart healthy balanced diet.             Psychosocial: Target Goals: Acknowledge presence or  absence of significant depression and/or stress, maximize coping skills, provide positive support system. Participant is able to verbalize types and ability to use techniques and skills needed for reducing stress and depression.   Education: Stress, Anxiety, and Depression - Group verbal and visual presentation to define topics covered.  Reviews how body is impacted by stress, anxiety, and depression.  Also discusses healthy ways to reduce stress and to treat/manage anxiety and depression.  Written material given at graduation.   Education: Sleep Hygiene -Provides group verbal and written instruction about how sleep can affect your health.  Define sleep hygiene, discuss sleep cycles and impact of sleep habits. Review good sleep hygiene tips.    Initial Review & Psychosocial Screening:  Initial Psych Review & Screening - 10/01/20 1307       Initial Review   Current issues with Current Stress Concerns    Source of Stress Concerns Unable to perform yard/household activities;Unable to participate in former interests or hobbies;Family;Occupation      Family Dynamics   [place]? No   some local family   Strains Intra-family strains    Comments husband is not supportive; she states she has learned she can only count on herself and not other people      Barriers   Psychosocial barriers to participate in program There are no identifiable barriers or psychosocial needs.      Screening Interventions   Interventions Encouraged to exercise;To provide support and resources with identified psychosocial needs;Provide feedback about the scores to participant    Expected Outcomes Short Term goal: Utilizing psychosocial counselor, staff and physician to assist with identification of specific Stressors or current issues interfering with healing process. Setting desired goal for each stressor or current issue identified.;Long Term Goal: Stressors or current issues are controlled or  eliminated.;Short Term goal: Identification and review with participant of any Quality of Life or Depression concerns found by scoring the questionnaire.;Long Term goal: The participant improves quality of Life and PHQ9 Scores as seen by post scores and/or verbalization of changes  Quality of Life Scores:   Quality of Life - 10/06/20 1537       Quality of Life   Select Quality of Life      Quality of Life Scores   Health/Function Pre 26.53 %    Socioeconomic Pre 29.25 %    Psych/Spiritual Pre 30 %    Family Pre 25.9 %    GLOBAL Pre 27.89 %            Scores of 19 and below usually indicate a poorer quality of life in these areas.  A difference of  2-3 points is a clinically meaningful difference.  A difference of 2-3 points in the total score of the Quality of Life Index has been associated with significant improvement in overall quality of life, self-image, physical symptoms, and general health in studies assessing change in quality of life.  PHQ-9: Recent Review Flowsheet Data     Depression screen [facility] 2/9 10/06/2020 07/18/2019 12/24/2017 09/20/2015   Decreased Interest 0 1 0 0   Down, Depressed, Hopeless 0 0 0 0   PHQ - 2 Score 0 1 0 0   Altered sleeping 0 0 0 -   Tired, decreased energy 1 2 0 -   Change in appetite 3 0 0 -   Feeling bad or failure about yourself  0 0 0 -   Trouble concentrating 0 0 0 -   Moving slowly or fidgety/restless 0 0 0 -   Suicidal thoughts 0 0 0 -   PHQ-9 Score 4 3 0 -   Difficult doing work/chores Not difficult at all Not difficult at all Not difficult at all -      Interpretation of Total Score  Total Score Depression Severity:  1-4 = Minimal depression, 5-9 = Mild depression, 10-14 = Moderate depression, 15-19 = Moderately severe depression, 20-27 = Severe depression   Psychosocial Evaluation and Intervention:  Psychosocial Evaluation - 10/01/20 1323       Psychosocial Evaluation & Interventions   Interventions Encouraged  to exercise with the program and follow exercise prescription;Stress management education;Relaxation education    Comments Latissa is referred to cardiac rehab post CABG x 3. She states she is feeling a little better each day and is ready to start the program so she can learn what she should be doing and how to eat and manage a heart healthy lifestyle. She is out of work for now, but plans on returning to [employer] when released where she was working night shift and a lot overtime. She is thankful that her supervisor has had a CABG in the past so she is being very supportive. Kynslei states that she and her husband are very disconnected, she does not feel supported at all by him. She prides herself in being a strong, indpendent woman and knows she has two feet to stand on financially and mentally if they were to separate. Her main focus right now is gaining her stamina back so she can live her life the way she wants to.    Expected Outcomes Short: attend cardiac rehab for education and exercise. Long: develop positive self care habits.    Continue Psychosocial Services  Follow up required by staff             Psychosocial Re-Evaluation:  Psychosocial Re-Evaluation     [place] Name 11/05/20 1021 11/29/20 1009           Psychosocial Re-Evaluation   Current issues with Current Stress Concerns Current  Stress Concerns      Comments Her husband has been more supportive since her journey for better health. She wants to not rely on anybody but knows that she needs help sometimes. Her husband has been proud of the progress that she has made in the program. Lisa [name] reports that she has been able to get back to some more of her ADL and hobbies as she feels her exercise is helping her get stronger. She has been able to go back to work and was able to do yardwork this weekend. She is currently working on adjusting to her new schedule now that she is back to work, but is  motivated to continue to be consistent with her  exercise routine.      Expected Outcomes Short: Continue to exercise regularly to support mental health and notify staff of any changes. Long: maintain mental health and well being through teaching of rehab or prescribed medications independently. Short: Continue to exercise regularly to support mental health and notify staff of any changes. Long: maintain mental health and well being through teaching of rehab or prescribed medications independently.      Interventions Encouraged to attend Cardiac Rehabilitation for the exercise Encouraged to attend Cardiac Rehabilitation for the exercise      Continue Psychosocial Services  Follow up required by staff Follow up required by staff               Psychosocial Discharge (Final Psychosocial Re-Evaluation):  Psychosocial Re-Evaluation - 11/29/20 1009       Psychosocial Re-Evaluation   Current issues with Current Stress Concerns    Comments Lisa [name] reports that she has been able to get back to some more of her ADL and hobbies as she feels her exercise is helping her get stronger. She has been able to go back to work and was able to do yardwork this weekend. She is currently working on adjusting to her new schedule now that she is back to work, but is  motivated to continue to be consistent with her exercise routine.    Expected Outcomes Short: Continue to exercise regularly to support mental health and notify staff of any changes. Long: maintain mental health and well being through teaching of rehab or prescribed medications independently.    Interventions Encouraged to attend Cardiac Rehabilitation for the exercise    Continue Psychosocial Services  Follow up required by staff             Vocational Rehabilitation: Provide vocational rehab assistance to qualifying candidates.   Vocational Rehab Evaluation & Intervention:  Vocational Rehab - 10/01/20 1307       Initial Vocational Rehab Evaluation & Intervention   Assessment shows need for  Vocational Rehabilitation No             Education: Education Goals: Education classes will be provided on a variety of topics geared toward better understanding of heart health and risk factor modification. Participant will state understanding/return demonstration of topics presented as noted by education test scores.  Learning Barriers/Preferences:  Learning Barriers/Preferences - 10/01/20 1306       Learning Barriers/Preferences   Learning Barriers None    Learning Preferences None             General Cardiac Education Topics:  AED/CPR: - Group verbal and written instruction with the use of models to demonstrate the basic use of the AED with the basic ABC's of resuscitation.   Anatomy and Cardiac Procedures: - Group verbal and visual  presentation and models provide information about basic cardiac anatomy and function. Reviews the testing methods done to diagnose heart disease and the outcomes of the test results. Describes the treatment choices: Medical Management, Angioplasty, or Coronary Bypass Surgery for treating various heart conditions including Myocardial Infarction, Angina, Valve Disease, and Cardiac Arrhythmias.  Written material given at graduation. Flowsheet Row Cardiac Rehab from 11/24/2020 in [facility] Cardiac and Pulmonary Rehab  Date 11/03/20  Educator SB  Instruction Review Code 1- Verbalizes Understanding       Medication Safety: - Group verbal and visual instruction to review commonly prescribed medications for heart and lung disease. Reviews the medication, class of the drug, and side effects. Includes the steps to properly store meds and maintain the prescription regimen.  Written material given at graduation. Flowsheet Row Cardiac Rehab from 11/24/2020 in [facility] Cardiac and Pulmonary Rehab  Date 11/24/20  Educator SB  Instruction Review Code 1- Verbalizes Understanding       Intimacy: - Group verbal instruction through game format to discuss how  heart and lung disease can affect sexual intimacy. Written material given at graduation.. Flowsheet Row Cardiac Rehab from 11/24/2020 in [facility] Cardiac and Pulmonary Rehab  Date 10/27/20  Educator [facility]  Instruction Review Code 1- Verbalizes Understanding       Know Your Numbers and Heart Failure: - Group verbal and visual instruction to discuss disease risk factors for cardiac and pulmonary disease and treatment options.  Reviews associated critical values for Overweight/Obesity, Hypertension, Cholesterol, and Diabetes.  Discusses basics of heart failure: signs/symptoms and treatments.  Introduces Heart Failure Zone chart for action plan for heart failure.  Written material given at graduation. Flowsheet Row Cardiac Rehab from 11/24/2020 in [facility] Cardiac and Pulmonary Rehab  Education need identified 10/06/20       Infection Prevention: - Provides verbal and written material to individual with discussion of infection control including proper hand washing and proper equipment cleaning during exercise session. Flowsheet Row Cardiac Rehab from 11/24/2020 in [facility] Cardiac and Pulmonary Rehab  Date 10/06/20  Educator [facility]  Instruction Review Code 1- Verbalizes Understanding       Falls Prevention: - Provides verbal and written material to individual with discussion of falls prevention and safety. Flowsheet Row Cardiac Rehab from 11/24/2020 in [facility] Cardiac and Pulmonary Rehab  Date 10/01/20  Educator [facility]  Instruction Review Code 1- Verbalizes Understanding       Other: -Provides group and verbal instruction on various topics (see comments)   Knowledge Questionnaire Score:  Knowledge Questionnaire Score - 10/06/20 1538       Knowledge Questionnaire Score   Pre Score 22/24 (angina, BP, nutrition, tobacco)             Core Components/Risk Factors/Patient Goals at Admission:  Personal Goals and Risk Factors at Admission - 10/06/20 1539       Core Components/Risk Factors/Patient  Goals on Admission    Weight Management Yes;Weight Loss    Intervention Weight Management: Develop a combined nutrition and exercise program designed to reach desired caloric intake, while maintaining appropriate intake of nutrient and fiber, sodium and fats, and appropriate energy expenditure required for the weight goal.;Weight Management: Provide education and appropriate resources to help participant work on and attain dietary goals.;Weight Management/Obesity: Establish reasonable short term and long term weight goals.    Admit Weight 199 lb 9.6 oz (90.5 kg)    Goal Weight: Short Term 195 lb (88.5 kg)    Goal Weight: Long Term 190 lb (86.2 kg)  Expected Outcomes Short Term: Continue to assess and modify interventions until short term weight is achieved;Long Term: Adherence to nutrition and physical activity/exercise program aimed toward attainment of established weight goal;Weight Loss: Understanding of general recommendations for a balanced deficit meal plan, which promotes 1-2 lb weight loss per week and includes a negative energy balance of [phone] kcal/d;Understanding recommendations for meals to include 15-35% energy as protein, 25-35% energy from fat, 35-60% energy from carbohydrates, less than 215m of dietary cholesterol, 20-35 gm of total fiber daily;Understanding of distribution of calorie intake throughout the day with the consumption of 4-5 meals/snacks    Diabetes Yes    Intervention Provide education about signs/symptoms and action to take for hypo/hyperglycemia.;Provide education about proper nutrition, including hydration, and aerobic/resistive exercise prescription along with prescribed medications to achieve blood glucose in normal ranges: Fasting glucose 65-99 mg/dL    Expected Outcomes Short Term: Participant verbalizes understanding of the signs/symptoms and immediate care of hyper/hypoglycemia, proper foot care and importance of medication, aerobic/resistive exercise and  nutrition plan for blood glucose control.;Long Term: Attainment of HbA1C < 7%.    Hypertension Yes    Intervention Provide education on lifestyle modifcations including regular physical activity/exercise, weight management, moderate sodium restriction and increased consumption of fresh fruit, vegetables, and low fat dairy, alcohol moderation, and smoking cessation.;Monitor prescription use compliance.    Expected Outcomes Short Term: Continued assessment and intervention until BP is < 140/958mHG in hypertensive participants. < 130/807mG in hypertensive participants with diabetes, heart failure or chronic kidney disease.;Long Term: Maintenance of blood pressure at goal levels.    Lipids Yes    Intervention Provide education and support for participant on nutrition & aerobic/resistive exercise along with prescribed medications to achieve LDL <31m10mDL >40mg28m Expected Outcomes Short Term: Participant states understanding of desired cholesterol values and is compliant with medications prescribed. Participant is following exercise prescription and nutrition guidelines.;Long Term: Cholesterol controlled with medications as prescribed, with individualized exercise RX and with personalized nutrition plan. Value goals: LDL < 31mg,94m > 40 mg.             Education:Diabetes - Individual verbal and written instruction to review signs/symptoms of diabetes, desired ranges of glucose level fasting, after meals and with exercise. Acknowledge that pre and post exercise glucose checks will be done for 3 sessions at entry of program. [facility]  Date 10/01/20  Educator [facility] Code 1- Verbalizes Understanding       Core Components/Risk Factors/Patient Goals Review:   Goals and Risk Factor Review     Row Name 11/05/20 1029 11/29/20 1006           Core Components/Risk Factors/Patient Goals Review   Personal Goals  Review Weight Management/Obesity Weight Management/Obesity      Review Lisa [name] to lose more weight. She wants to get to 180 pounds. Informed her to start looking at nutrition lables to watch her sugar and sodium. Lisa [name] that she has seen mostly a downward trend in her weight. She is taking all meds. consistently working on making heart healthy dietary choices and exercising regulary to help control cardaic risk factors.      Expected Outcomes Short: read more nutrition labels and watch sodium. Long: maintain reading nutrition labels and sodium intake independently Short: continue to take all meds as prescribed. Short term weigth goal is to stay under 200 lbs.  Long: Control cardiac risk  factors with meds, exercise routine, and heart healthy diet. Long term weight goal 180 lbs.               Core Components/Risk Factors/Patient Goals at Discharge (Final Review):   Goals and Risk Factor Review - 11/29/20 1006       Core Components/Risk Factors/Patient Goals Review   Personal Goals Review Weight Management/Obesity    Review Lisa [name] stated that she has seen mostly a downward trend in her weight. She is taking all meds. consistently working on making heart healthy dietary choices and exercising regulary to help control cardaic risk factors.    Expected Outcomes Short: continue to take all meds as prescribed. Short term weigth goal is to stay under 200 lbs.  Long: Control cardiac risk factors with meds, exercise routine, and heart healthy diet. Long term weight goal 180 lbs.             ITP Comments:  ITP Comments     Row Name 10/01/20 1329 10/06/20 1533 10/18/20 1415 10/20/20 0859 11/04/20 0928   ITP Comments Initial telephone orientation completed. Diagnosis can be found in [facility] 4/19. EP orientation scheduled for Wednesday 6/1 at 9:30 Completed 6MWT and gym orientation. Initial ITP created and sent for review to Dr. Emily Filbert, Medical Director. First full day of exercise!  Patient  was oriented to gym and equipment including functions, settings, policies, and procedures.  Patient's individual exercise prescription and treatment plan were reviewed.  All starting workloads were established based on the results of the 6 minute walk test done at initial orientation visit.  The plan for exercise progression was also introduced and progression will be customized based on patient's performance and goals. 30 Day review completed. Medical Director ITP review done, changes made as directed, and signed approval by Medical Director.  new to program Completed initial RD evaluation    Row Name 11/17/20 1130 12/15/20 0803 12/27/20 1613 01/07/21 1032     ITP Comments 30 Day review completed. Medical Director ITP review done, changes made as directed, and signed approval by Medical Director. 30 Day review completed. Medical Director ITP review done, changes made as directed, and signed approval by Medical Director. Out since 12/06/20.  Was supposed to switch classes with new work schedule.  Has yet to return. Lisa [name] has not been able to return to rehab since starting her new job.  We will discharge her at this time.             Comments: Discharge ITP

## 2021-01-07 NOTE — Telephone Encounter (Signed)
Macrobid resent to pharmacy and left message for patient advising.

## 2021-01-07 NOTE — Progress Notes (Signed)
Discharge Progress Report  Patient Details  Name: Lisa [name] MRN: [id number] Date of Birth: November 19, 1960 Referring Provider:   Flowsheet Row Cardiac Rehab from 10/06/2020 in [facility] Cardiac and Pulmonary Rehab  Referring Provider [name] MD        Number of Visits: 20  Reason for Discharge:  Early Exit:  Back to work  Smoking History:  Social History   Tobacco Use  Smoking Status Never  Smokeless Tobacco Never    Diagnosis:  S/P CABG x 3  ADL UCSD:   Initial Exercise Prescription:  Initial Exercise Prescription - 10/06/20 1500       Date of Initial Exercise RX and Referring Provider   Date 10/06/20    Referring Provider [name] MD      Treadmill   MPH 1.6    Grade 0.5    Minutes 15    METs 2.32      Recumbant Bike   Level 1    RPM 50    Watts 22    Minutes 15    METs 2      REL-XR   Level 1    Speed 50    Minutes 15    METs 2      Prescription Details   Frequency (times per week) 3    Duration Progress to 30 minutes of continuous aerobic without signs/symptoms of physical distress      Intensity   THRR 40-80% of Max Heartrate 117-146    Ratings of Perceived Exertion 11-13    Perceived Dyspnea 0-4      Progression   Progression Continue to progress workloads to maintain intensity without signs/symptoms of physical distress.      Resistance Training   Training Prescription Yes    Weight 3 lb    Reps 10-15             Discharge Exercise Prescription (Final Exercise Prescription Changes):  Exercise Prescription Changes - 12/15/20 1300       Response to Exercise   Blood Pressure (Admit) 124/78    Blood Pressure (Exit) 110/70    Heart Rate (Admit) 87 bpm    Heart Rate (Exercise) 107 bpm    Heart Rate (Exit) 80 bpm    Rating of Perceived Exertion (Exercise) 16    Symptoms none    Duration Continue with 30 min of aerobic exercise without signs/symptoms of physical distress.    Intensity THRR unchanged       Progression   Progression Continue to progress workloads to maintain intensity without signs/symptoms of physical distress.    Average METs 2.63      Resistance Training   Training Prescription Yes    Weight 3 lb    Reps 10-15             Functional Capacity:  6 Minute Walk     Row Name 10/06/20 1533         6 Minute Walk   Phase Initial     Distance 890 feet     Walk Time 6 minutes     # of Rest Breaks 0     MPH 1.68     METS 2.76     RPE 11     VO2 Peak 9.67     Symptoms No     Resting HR 87 bpm     Resting BP 128/62     Resting Oxygen Saturation  98 %     Exercise Oxygen Saturation  during 6 min walk 94 %     Max Ex. HR 98 bpm     Max Ex. BP 134/72     2 Minute Post BP 126/64              Psychological, QOL, Others - Outcomes: PHQ 2/9: Depression screen [facility] 2/9 10/06/2020 07/18/2019 12/24/2017 09/20/2015  Decreased Interest 0 1 0 0  Down, Depressed, Hopeless 0 0 0 0  PHQ - 2 Score 0 1 0 0  Altered sleeping 0 0 0 -  Tired, decreased energy 1 2 0 -  Change in appetite 3 0 0 -  Feeling bad or failure about yourself  0 0 0 -  Trouble concentrating 0 0 0 -  Moving slowly or fidgety/restless 0 0 0 -  Suicidal thoughts 0 0 0 -  PHQ-9 Score 4 3 0 -  Difficult doing work/chores Not difficult at all Not difficult at all Not difficult at all -        Goals reviewed with patient; copy given to patient.

## 2021-01-24 ENCOUNTER — Telehealth: Payer: Self-pay

## 2021-01-24 NOTE — Telephone Encounter (Signed)
Copied from [place] [id number]. Topic: Appointment Scheduling - Scheduling Inquiry for Clinic >> Jan 24, 2021  8:05 AM Valere Dross wrote: Reason for CRM: Pt called in stating she has an ongoing UTI and wanted to see about getting squeezed in sooner than what was available, due to only wanting to see a female provider, pt asked if there was any way she could be seen, please advise.

## 2021-01-24 NOTE — Telephone Encounter (Signed)
Contacted patient back and gave her the options of seeing Dr. Caryn Section tomorrow at 1:20PM for evaluation or with Tally Joe later in afternoon and she requested first appt. I do not see an order or results for U/A in chart Elise. I was unsure who patient dropped urine off to or if she had told you previously she was dropping urine off. Amparo [place]

## 2021-01-24 NOTE — Telephone Encounter (Signed)
Your schedule is full this week, would you be willing to squeeze this patient in? And if so what time would probably be best for you? 11:20? Or in between a 1Pm slot?

## 2021-01-25 ENCOUNTER — Ambulatory Visit: Payer: 59 | Admitting: Family Medicine

## 2021-01-25 NOTE — Progress Notes (Deleted)
      Established patient visit   Patient: Lisa [name]   DOB: Apr 22, 1961   60 y.o. Female  MRN: [id number] Visit Date: 01/25/2021  Today's healthcare provider: Lelon Huh, MD   No chief complaint on file.  Subjective    HPI  Patient was seen in the ER on 01/01/2021 for UTI. She was prescribed Cephalexin.   {Link to patient history deactivated due to formatting error:1}  Medications: Outpatient Medications Prior to Visit  Medication Sig   aspirin 81 MG tablet Take 1 tablet by mouth daily.   atorvastatin (LIPITOR) 80 MG tablet Take 1 tablet (80 mg total) by mouth daily at 6 PM.   Blood Glucose Monitoring Suppl (FIFTY50 GLUCOSE METER 2.0) W/DEVICE KIT Use as directed.   cephALEXin (KEFLEX) 500 MG capsule Take 1 capsule (500 mg total) by mouth 4 (four) times daily.   clopidogrel (PLAVIX) 75 MG tablet Take 75 mg by mouth daily. (Patient not taking: Reported on 08/11/2020)   glyBURIDE (DIABETA) 2.5 MG tablet Take 1 tablet by mouth daily. (Patient not taking: Reported on 08/11/2020)   insulin glargine (LANTUS) 100 UNIT/ML injection Inject 50 Units into the skin daily.   JARDIANCE 25 MG TABS tablet Take 25 mg daily by mouth. (Patient not taking: Reported on 08/11/2020)   lisinopril (ZESTRIL) 5 MG tablet Take 5 mg by mouth daily.   metoprolol succinate (TOPROL-XL) 25 MG 24 hr tablet Take 25 mg by mouth daily.   metoprolol tartrate (LOPRESSOR) 25 MG tablet Take by mouth.   MULTIPLE VITAMINS-MINERALS PO Take 1 tablet by mouth daily.   nitrofurantoin, macrocrystal-monohydrate, (MACROBID) 100 MG capsule Take 1 capsule (100 mg total) by mouth 2 (two) times daily.   omeprazole (PRILOSEC) 40 MG capsule TAKE 1 CAPSULE BY MOUTH  DAILY   ondansetron (ZOFRAN) 4 MG tablet Take 1 tablet (4 mg total) by mouth every 8 (eight) hours as needed for nausea or vomiting. (Patient not taking: Reported on 07/29/2020)   sulfamethoxazole-trimethoprim (BACTRIM DS) 800-160 MG tablet Take 1 tablet by mouth 2  (two) times daily. (Patient not taking: Reported on 08/11/2020)   terconazole (TERAZOL 7) 0.4 % vaginal cream Place 1 applicator vaginally at bedtime.   ursodiol (ACTIGALL) 300 MG capsule Take 300 mg by mouth daily.    No facility-administered medications prior to visit.    Review of Systems  {Labs  Heme  Chem  Endocrine  Serology  Results Review (optional):23779}   Objective    There were no vitals taken for this visit. {Show previous vital signs (optional):23777}  Physical Exam  ***  No results found for any visits on 01/25/21.  Assessment & Plan     ***  No follow-ups on file.      {provider attestation***:1}   Lelon Huh, MD  [facility] [phone] (phone) [phone] (fax)  [place]

## 2021-01-25 NOTE — Telephone Encounter (Signed)
Pt states she had family emergency and is on her way to [place].  Offered virtual visit, but pt states she does not like to drive and talk on the phone. Will call back when she returns.

## 2021-04-18 LAB — HM DIABETES EYE EXAM

## 2021-05-24 ENCOUNTER — Encounter: Payer: Self-pay | Admitting: Family Medicine

## 2021-07-07 ENCOUNTER — Other Ambulatory Visit: Payer: Self-pay

## 2021-07-07 ENCOUNTER — Encounter: Payer: Self-pay | Admitting: Family Medicine

## 2021-07-07 ENCOUNTER — Ambulatory Visit: Payer: 59 | Admitting: Family Medicine

## 2021-07-07 ENCOUNTER — Other Ambulatory Visit (HOSPITAL_COMMUNITY)
Admission: RE | Admit: 2021-07-07 | Discharge: 2021-07-07 | Disposition: A | Discharge status: Home / Self Care | Payer: 59 | Source: Ambulatory Visit | Attending: Family Medicine | Admitting: Family Medicine

## 2021-07-07 VITALS — BP 129/83 | HR 80 | Temp 97.9°F | Resp 14 | Ht 69.0 in | Wt 193.6 lb

## 2021-07-07 DIAGNOSIS — L299 Pruritus, unspecified: Secondary | ICD-10-CM | POA: Diagnosis not present

## 2021-07-07 DIAGNOSIS — N898 Other specified noninflammatory disorders of vagina: Secondary | ICD-10-CM | POA: Insufficient documentation

## 2021-07-07 DIAGNOSIS — N3090 Cystitis, unspecified without hematuria: Secondary | ICD-10-CM | POA: Diagnosis not present

## 2021-07-07 LAB — POCT URINALYSIS DIPSTICK
Bilirubin, UA: NEGATIVE
Glucose, UA: POSITIVE — AB
Ketones, UA: NEGATIVE
Nitrite, UA: POSITIVE
Protein, UA: POSITIVE — AB
Spec Grav, UA: 1.01 (ref 1.010–1.025)
Urobilinogen, UA: >=8 E.U./dL — AB
pH, UA: 6 (ref 5.0–8.0)

## 2021-07-07 MED ORDER — CEPHALEXIN 500 MG PO CAPS: 500.0000 mg | ORAL_CAPSULE | Freq: Three times a day (TID) | ORAL | 0 refills | Status: AC

## 2021-07-07 NOTE — Progress Notes (Signed)
Acute Office Visit  Subjective:    Patient ID: Lisa [name], female    DOB: 1960/06/28, 61 y.o.   MRN: [id number]  No chief complaint on file.   HPI Patient is in today for vaginal itching   Last Friday 07/01/2021 patient went to urgent care for itching in the vagina. PA told her she had a UTI and yeast infection. Urgent care gave her 2 Fluconazole 152m tablets. Those did not help at all per the patient. Patient is also using Terconazole vaginal cream regularly.  Burning when urinating. Itching feels external and gets worse when urinating and moving. Urine frequency is normal. External area is irritated, and has some bleeding.    Past Medical History:  Diagnosis Date   Allergic rhinitis 02/15/2015   Arthritis    BP (high blood pressure) 02/15/2015   Cervical lymphadenopathy    [place]    Diabetes mellitus    Elevated liver enzymes 09/23/2015   Elevated liver enzymes 09/23/2015   GERD (gastroesophageal reflux disease)    Gonalgia 02/15/2015   Hernia, lateral ventral 11/19/2012   Hypertension    Intra-abdominal and pelvic swelling, mass and lump, unspecified site 11/19/2012   Lymphadenopathy 03/18/2017    Past Surgical History:  Procedure Laterality Date   ABDOMINAL HYSTERECTOMY     2009   BREAST BIOPSY     CHOLECYSTECTOMY  1997   COLONOSCOPY WITH PROPOFOL N/A 01/21/2018   Procedure: COLONOSCOPY WITH PROPOFOL;  Surgeon: AJonathon Bellows MD;  [facility];  Service: Gastroenterology;  Laterality: N/A;   CORONARY ANGIOPLASTY     stints   CORONARY STENT INTERVENTION N/A 09/13/2016   Procedure: Coronary Stent Intervention;  Surgeon: CYolonda Kida MD;  [facility];  Service: Cardiovascular;  Laterality: N/A;   GALLBLADDER SURGERY     HERNIA REPAIR  70/76/2263  periumbilical with right ooph   JOINT REPLACEMENT     left knee   KNEE SURGERY  12/2011   total knee replacement Dr. MDuayne Cal  LEFT HEART CATH AND CORONARY ANGIOGRAPHY N/A  09/13/2016   Procedure: Left Heart Cath and Coronary Angiography;  Surgeon: CYolonda Kida MD;  [place] [facility];  Service: Cardiovascular;  Laterality: N/A;   LEFT HEART CATH AND CORONARY ANGIOGRAPHY Left 08/11/2020   Procedure: LEFT HEART CATH AND CORONARY ANGIOGRAPHY;  Surgeon: CYolonda Kida MD;  [facility];  Service: Cardiovascular;  Laterality: Left;   LYMPH GLAND EXCISION N/A 06/04/2017   Procedure: CERVICAL LYMPH NODE BIOPSY;  Surgeon: DVickie Epley MD;  Location: [facility];  Service: General;  Laterality: N/A;   TUBAL LIGATION      Family History  Problem Relation Age of Onset   Diabetes Sister    Diabetes Sister    Diabetes Sister    Diabetes Mother    Heart disease Father     Social History   Socioeconomic History   Marital status: Married    Spouse name: Not on file   Number of children: 1   Years of education: Not on file   Highest education level: Not on file  Occupational History   Not on file  Tobacco Use   Smoking status: Never   Smokeless tobacco: Never  Vaping Use   Vaping Use: Never used  Substance and Sexual Activity   Alcohol use: No   Drug use: No   Sexual activity: Not on file  Other Topics Concern   Not on file  Social History Narrative   Not on file   Social Determinants of Health   Financial Resource Strain: Not on file  Food Insecurity: Not on file  Transportation Needs: Not on file  Physical Activity: Not on file  Stress: Not on file  Social Connections: Not on file  Intimate Partner Violence: Not on file    Outpatient Medications Prior to Visit  Medication Sig Dispense Refill   aspirin 81 MG tablet Take 1 tablet by mouth daily.     atorvastatin (LIPITOR) 80 MG tablet Take 1 tablet (80 mg total) by mouth daily at 6 PM. 90 tablet 3   Blood Glucose Monitoring Suppl (FIFTY50 GLUCOSE METER 2.0) W/DEVICE KIT Use as directed.     insulin glargine (LANTUS) 100 UNIT/ML injection Inject 50 Units  into the skin daily.     lisinopril (ZESTRIL) 5 MG tablet Take 5 mg by mouth daily.     metoprolol succinate (TOPROL-XL) 25 MG 24 hr tablet Take 25 mg by mouth daily.     MULTIPLE VITAMINS-MINERALS PO Take 1 tablet by mouth daily.     omeprazole (PRILOSEC) 40 MG capsule TAKE 1 CAPSULE BY MOUTH  DAILY 90 capsule 3   ondansetron (ZOFRAN) 4 MG tablet Take 1 tablet (4 mg total) by mouth every 8 (eight) hours as needed for nausea or vomiting. 30 tablet 0   terconazole (TERAZOL 7) 0.4 % vaginal cream Place 1 applicator vaginally at bedtime. 45 g 0   ursodiol (ACTIGALL) 300 MG capsule Take 300 mg by mouth daily.      cephALEXin (KEFLEX) 500 MG capsule Take 1 capsule (500 mg total) by mouth 4 (four) times daily. (Patient not taking: Reported on 07/07/2021) 20 capsule 0   clopidogrel (PLAVIX) 75 MG tablet Take 75 mg by mouth daily. (Patient not taking: Reported on 07/07/2021)     glyBURIDE (DIABETA) 2.5 MG tablet Take 1 tablet by mouth daily. (Patient not taking: Reported on 07/07/2021)     JARDIANCE 25 MG TABS tablet Take 25 mg daily by mouth. (Patient not taking: Reported on 07/07/2021)  6   metoprolol tartrate (LOPRESSOR) 25 MG tablet Take by mouth. (Patient not taking: Reported on 07/07/2021)     nitrofurantoin, macrocrystal-monohydrate, (MACROBID) 100 MG capsule Take 1 capsule (100 mg total) by mouth 2 (two) times daily. (Patient not taking: Reported on 07/07/2021) 10 capsule 0   sulfamethoxazole-trimethoprim (BACTRIM DS) 800-160 MG tablet Take 1 tablet by mouth 2 (two) times daily. (Patient not taking: Reported on 07/07/2021) 6 tablet 0   No facility-administered medications prior to visit.    Allergies  Allergen Reactions   Amoxicillin-Pot Clavulanate Swelling    lips   Ciprofloxacin Swelling    lips   Clarithromycin Swelling    lips    Review of Systems Per HPI     Objective:    Physical Exam Vitals reviewed.  Constitutional:      General: She is not in acute distress.    Appearance: She is  well-developed.  HENT:     Head: Normocephalic and atraumatic.  Eyes:     General: No scleral icterus.    Conjunctiva/sclera: Conjunctivae normal.  Cardiovascular:     Rate and Rhythm: Normal rate and regular rhythm.  Pulmonary:     Effort: Pulmonary effort is normal. No respiratory distress.  Genitourinary:    Comments: GYN:  External genitalia within normal limits.  Without irritation visible. vaginal mucosa pink, moist, normal rugae.  Nonfriable cervix without lesions, no discharge or bleeding  noted on speculum exam.  There is a white milky substance in the posterior cul-de-sac that may be her terconazole cream.   Skin:    General: Skin is warm and dry.     Findings: No rash.  Neurological:     Mental Status: She is alert and oriented to person, place, and time.  Psychiatric:        Behavior: Behavior normal.    There were no vitals taken for this visit. Wt Readings from Last 3 Encounters:  10/06/20 199 lb 9.6 oz (90.5 kg)  08/11/20 215 lb (97.5 kg)  07/29/20 216 lb (98 kg)    Health Maintenance Due  Topic Date Due   Zoster Vaccines- Shingrix (1 of 2) Never done   COVID-19 Vaccine (3 - Booster for Pfizer series) 10/15/2019   HEMOGLOBIN A1C  12/02/2019   FOOT EXAM  07/17/2020   INFLUENZA VACCINE  12/06/2020    There are no preventive care reminders to display for this patient.   Lab Results  Component Value Date   TSH 1.18 11/05/2018   Lab Results  Component Value Date   WBC 18.4 (H) 01/01/2021   HGB 11.2 (L) 01/01/2021   HCT 35.2 (L) 01/01/2021   MCV 83.0 01/01/2021   PLT 119 (L) 01/01/2021   Lab Results  Component Value Date   NA 132 (L) 01/01/2021   K 3.0 (L) 01/01/2021   CO2 27 01/01/2021   GLUCOSE 230 (H) 01/01/2021   BUN 8 01/01/2021   CREATININE 0.93 01/01/2021   BILITOT 4.9 (H) 01/01/2021   ALKPHOS 766 (H) 01/01/2021   AST 87 (H) 01/01/2021   ALT 113 (H) 01/01/2021   PROT 8.3 (H) 01/01/2021   ALBUMIN 2.5 (L) 01/01/2021   CALCIUM 8.7 (L)  01/01/2021   ANIONGAP 9 01/01/2021   Lab Results  Component Value Date   CHOL 355 (A) 11/05/2018   Lab Results  Component Value Date   HDL 133 (A) 11/05/2018   Lab Results  Component Value Date   LDLCALC 207 11/05/2018   Lab Results  Component Value Date   TRIG 74 11/05/2018   Lab Results  Component Value Date   CHOLHDL 2.8 12/26/2017   Lab Results  Component Value Date   HGBA1C 10.5 06/04/2019       Assessment & Plan:   1. Cystitis - Symptoms and UA consistent with UTI -No systemic symptoms or signs of pyelonephritis -She was recently treated with Macrobid, but it does not seem that a urine culture was sent or ever resulted and she is still having symptoms and a positive UA -Will start treatment with 5 day course of Keflex -of note, patient does have lip swelling reaction listed to Augmentin, but she has tolerated Keflex in the past -We will send urine culture to confirm sensitivities -Discussed return precautions  - Urine Culture  2. Vaginal discharge -Ongoing issue status post Diflucan and terconazole cream - No irritation noted on exam - I suspect that the external burning may be related to her UTI as above - I will send swab for yeast and BV and treat pending results - POCT urinalysis dipstick - Cervicovaginal ancillary only  3. Pruritus -Patient has known PBC and is on ursodiol, but she has noticed increased itching depending on what she eats - This is generalized pruritus - She is concerned she may have a food allergy - Referral to allergy for further testing - Ambulatory referral to Allergy   I, Lavon Paganini, MD, have reviewed  all documentation for this visit. The documentation on 07/07/21 for the exam, diagnosis, procedures, and orders are all accurate and complete.   Deontray Hunnicutt, Dionne Bucy, MD, MPH [facility]

## 2021-07-10 LAB — URINE CULTURE

## 2021-07-11 LAB — CERVICOVAGINAL ANCILLARY ONLY
Bacterial Vaginitis (gardnerella): NEGATIVE
Candida Glabrata: NEGATIVE
Candida Vaginitis: NEGATIVE
Comment: NEGATIVE
Comment: NEGATIVE
Comment: NEGATIVE

## 2021-08-08 ENCOUNTER — Ambulatory Visit: Payer: 59 | Admitting: Family Medicine

## 2021-08-08 NOTE — Progress Notes (Deleted)
?  ? ? ?Established patient visit ? ? ?Patient: Lisa [name]   DOB: 10-28-1960   61 y.o. Female  MRN: [id number] ?Visit Date: 08/08/2021 ? ?Today's healthcare provider: Lavon Paganini, MD  ? ?No chief complaint on file. ? ?Subjective  ?  ?HPI  ?Diabetes Mellitus Type II, follow-up ? ?Lab Results  ?Component Value Date  ? HGBA1C 10.5 06/04/2019  ? HGBA1C 12.1 (H) 12/26/2017  ? HGBA1C 8.3 05/22/2017  ? ?Last seen for diabetes 2 years ago.  ?Followed by Endocrine ? ?Most Recent Eye Exam: *** ? ?--------------------------------------------------------------------------------------------------- ?Hypertension, follow-up ? ?BP Readings from Last 3 Encounters:  ?07/07/21 129/83  ?01/01/21 118/67  ?08/11/20 133/68  ? Wt Readings from Last 3 Encounters:  ?07/07/21 193 lb 9.6 oz (87.8 kg)  ?10/06/20 199 lb 9.6 oz (90.5 kg)  ?08/11/20 215 lb (97.5 kg)  ?  ? ?She was last seen for hypertension 2 years ago.  ?BP at that visit was 160/91. ?Management since that visit includes Continue lisinopril and metoprolol. ?She reports {excellent/good/fair/poor:19665} compliance with treatment. ?She {is/is not:9024} having side effects. {document side effects if present:1} ?She {is/is not:9024} exercising. ?She {is/is not:9024} adherent to low salt diet.   ?Outside blood pressures are {enter patient reported home BP, or 'not being checked':1}. ? ?She does not smoke. ? ?Use of agents associated with hypertension: none.  ? ?--------------------------------------------------------------------------------------------------- ?Lipid/Cholesterol, follow-up ? ?Last Lipid Panel: ?Lab Results  ?Component Value Date  ? CHOL 355 (A) 11/05/2018  ? [place] 207 11/05/2018  ? HDL 133 (A) 11/05/2018  ? TRIG 74 11/05/2018  ? ? ?She was last seen for this 2 years ago.  ?Management since that visit includes continue Atorvastatin. ? ?She reports {excellent/good/fair/poor:19665} compliance with treatment. ?She {is/is not:9024} having side effects.  {document side effects if present:1} ? ?Symptoms: ?{Yes/No:20286} appetite changes {Yes/No:20286} foot ulcerations  ?{Yes/No:20286} chest pain {Yes/No:20286} chest pressure/discomfort  ?{Yes/No:20286} dyspnea {Yes/No:20286} orthopnea  ?{Yes/No:20286} fatigue {Yes/No:20286} lower extremity edema  ?{Yes/No:20286} palpitations {Yes/No:20286} paroxysmal nocturnal dyspnea  ?{Yes/No:20286} nausea {Yes/No:20286} numbness or tingling of extremity  ?{Yes/No:20286} polydipsia {Yes/No:20286} polyuria  ?{Yes/No:20286} speech difficulty {Yes/No:20286} syncope  ? ?She is following a {diet:21022986} diet. ?Current exercise: {exercise types:16438} ? ?Last metabolic panel ?Lab Results  ?Component Value Date  ? GLUCOSE 230 (H) 01/01/2021  ? NA 132 (L) 01/01/2021  ? K 3.0 (L) 01/01/2021  ? BUN 8 01/01/2021  ? CREATININE 0.93 01/01/2021  ? GFRNONAA >60 01/01/2021  ? CALCIUM 8.7 (L) 01/01/2021  ? AST 87 (H) 01/01/2021  ? ALT 113 (H) 01/01/2021  ? ?The ASCVD Risk score (Arnett DK, et al., 2019) failed to calculate for the following reasons: ?  The valid HDL cholesterol range is 20 to 100 mg/dL ?  The valid total cholesterol range is 130 to 320 mg/dL ? ?---------------------------------------------------------------------------------------------------  ?Medications: ?Outpatient Medications Prior to Visit  ?Medication Sig  ? aspirin 81 MG tablet Take 1 tablet by mouth daily.  ? atorvastatin (LIPITOR) 80 MG tablet Take 1 tablet (80 mg total) by mouth daily at 6 PM.  ? Blood Glucose Monitoring Suppl (FIFTY50 GLUCOSE METER 2.0) W/DEVICE KIT Use as directed.  ? clopidogrel (PLAVIX) 75 MG tablet Take 75 mg by mouth daily. (Patient not taking: Reported on 07/07/2021)  ? glyBURIDE (DIABETA) 2.5 MG tablet Take 1 tablet by mouth daily. (Patient not taking: Reported on 07/07/2021)  ? insulin glargine (LANTUS) 100 UNIT/ML injection Inject 50 Units into the skin daily.  ? JARDIANCE 25 MG TABS tablet Take 25 mg daily by  mouth. (Patient not taking: Reported  on 07/07/2021)  ? lisinopril (ZESTRIL) 5 MG tablet Take 5 mg by mouth daily.  ? metoprolol succinate (TOPROL-XL) 25 MG 24 hr tablet Take 25 mg by mouth daily.  ? metoprolol tartrate (LOPRESSOR) 25 MG tablet Take by mouth. (Patient not taking: Reported on 07/07/2021)  ? MULTIPLE VITAMINS-MINERALS PO Take 1 tablet by mouth daily.  ? omeprazole (PRILOSEC) 40 MG capsule TAKE 1 CAPSULE BY MOUTH  DAILY  ? ondansetron (ZOFRAN) 4 MG tablet Take 1 tablet (4 mg total) by mouth every 8 (eight) hours as needed for nausea or vomiting.  ? terconazole (TERAZOL 7) 0.4 % vaginal cream Place 1 applicator vaginally at bedtime.  ? ursodiol (ACTIGALL) 300 MG capsule Take 300 mg by mouth daily.   ? ?No facility-administered medications prior to visit.  ? ? ?Review of Systems ? ?{Labs  Heme  Chem  Endocrine  Serology  Results Review (optional):23779} ?  Objective  ?  ?There were no vitals taken for this visit. ?{Show previous vital signs (optional):23777} ? ?Physical Exam  ?*** ? ?No results found for any visits on 08/08/21. ? Assessment & Plan  ?  ? ?*** ? ?No follow-ups on file.  ?   ? ?{provider attestation***:1} ? ? ?Lavon Paganini, MD  ?[facility] ?[phone] (phone) ?[phone] (fax) ? ?[facility]  ?

## 2022-03-14 ENCOUNTER — Ambulatory Visit: Payer: Self-pay

## 2022-03-14 ENCOUNTER — Other Ambulatory Visit: Payer: Self-pay | Admitting: Physician Assistant

## 2022-03-14 ENCOUNTER — Encounter: Payer: Self-pay | Admitting: Physician Assistant

## 2022-03-14 ENCOUNTER — Other Ambulatory Visit: Payer: Self-pay

## 2022-03-14 ENCOUNTER — Ambulatory Visit (INDEPENDENT_AMBULATORY_CARE_PROVIDER_SITE_OTHER): Payer: 59 | Admitting: Physician Assistant

## 2022-03-14 VITALS — BP 111/62 | HR 91 | Resp 16 | Ht 69.0 in | Wt 188.0 lb

## 2022-03-14 DIAGNOSIS — Z794 Long term (current) use of insulin: Secondary | ICD-10-CM

## 2022-03-14 DIAGNOSIS — E1169 Type 2 diabetes mellitus with other specified complication: Secondary | ICD-10-CM | POA: Diagnosis not present

## 2022-03-14 DIAGNOSIS — R300 Dysuria: Secondary | ICD-10-CM

## 2022-03-14 DIAGNOSIS — N3001 Acute cystitis with hematuria: Secondary | ICD-10-CM | POA: Insufficient documentation

## 2022-03-14 DIAGNOSIS — R1013 Epigastric pain: Secondary | ICD-10-CM | POA: Diagnosis not present

## 2022-03-14 LAB — POCT URINALYSIS DIPSTICK
Bilirubin, UA: NEGATIVE
Glucose, UA: POSITIVE — AB
Ketones, UA: 40
Leukocytes, UA: NEGATIVE
Nitrite, UA: POSITIVE
Protein, UA: POSITIVE — AB
Spec Grav, UA: 1.015 (ref 1.010–1.025)
Urobilinogen, UA: 0.2 E.U./dL
pH, UA: 6 (ref 5.0–8.0)

## 2022-03-14 MED ORDER — INSULIN GLARGINE 100 UNIT/ML ~~LOC~~ SOLN: 70.0000 [IU] | Freq: Every day | SUBCUTANEOUS | 0 refills | Status: AC

## 2022-03-14 MED ORDER — FAMOTIDINE 20 MG PO TABS: 20.0000 mg | ORAL_TABLET | Freq: Two times a day (BID) | ORAL | 0 refills | Status: DC

## 2022-03-14 MED ORDER — SULFAMETHOXAZOLE-TRIMETHOPRIM 800-160 MG PO TABS: 1.0000 | ORAL_TABLET | Freq: Two times a day (BID) | ORAL | 0 refills | Status: DC

## 2022-03-14 NOTE — Telephone Encounter (Signed)
Reason for Disposition  Urinating more frequently than usual (i.e., frequency)  Bad or foul-smelling urine  Answer Assessment - Initial Assessment Questions 1. SYMPTOM: "What's the main symptom you're concerned about?" (e.g., frequency, incontinence)     Frequency, burning, itching 1/10, off smelling like a fish 2. ONSET: "When did the    start?"     1 week 3. PAIN: "Is there any pain?" If Yes, ask: "How bad is it?" (Scale: 1-10; mild, moderate, severe)      4. CAUSE: "What do you think is causing the symptoms?"     UTI 5. OTHER SYMPTOMS: "Do you have any other symptoms?" (e.g., blood in urine, fever, flank pain, pain with urination)     burning 6. PREGNANCY: "Is there any chance you are pregnant?" "When was your last menstrual period?"  Protocols used: Urinary Symptoms-A-AH

## 2022-03-14 NOTE — Progress Notes (Signed)
I,Tiffany J Bragg,acting as a Education administrator for [employer], PA-C.,have documented all relevant documentation on the behalf of Mikey Kirschner, PA-C,as directed by  Mikey Kirschner, PA-C while in the presence of Mikey Kirschner, PA-C.   Established patient visit   Patient: Lisa [name]   DOB: 08/25/1960   61 y.o. Female  MRN: [id number] Visit Date: 03/14/2022  Today's healthcare provider: Mikey Kirschner, PA-C   Chief Complaint  Patient presents with   Abdominal Pain    Patient complains of abdominal pain and burning with urination starting 6 days ago.    Subjective    HPI  Pt reports dysuria, urinary frequency x 6 days. She reports seeing blood on the toilet paper when she wipes after urinating. Denies visible hematuria in urine.   She reports an upper abdominal pain when eating x 3-4 days. Reports it feels like an aching only occurs when she is eating. Denies nausea, vomiting. Reports history of acid reflux. blood on  Medications: Outpatient Medications Prior to Visit  Medication Sig   aspirin 81 MG tablet Take 1 tablet by mouth daily.   atorvastatin (LIPITOR) 80 MG tablet Take 1 tablet (80 mg total) by mouth daily at 6 PM.   Blood Glucose Monitoring Suppl (FIFTY50 GLUCOSE METER 2.0) W/DEVICE KIT Use as directed.   metoprolol succinate (TOPROL-XL) 25 MG 24 hr tablet Take 25 mg by mouth daily.   metoprolol tartrate (LOPRESSOR) 25 MG tablet Take by mouth.   MULTIPLE VITAMINS-MINERALS PO Take 1 tablet by mouth daily.   omeprazole (PRILOSEC) 40 MG capsule TAKE 1 CAPSULE BY MOUTH  DAILY   ursodiol (ACTIGALL) 300 MG capsule Take 300 mg by mouth daily.    [DISCONTINUED] insulin glargine (LANTUS) 100 UNIT/ML injection Inject 50 Units into the skin daily.   clopidogrel (PLAVIX) 75 MG tablet Take 75 mg by mouth daily. (Patient not taking: Reported on 03/14/2022)   glyBURIDE (DIABETA) 2.5 MG tablet Take 1 tablet by mouth daily. (Patient not taking: Reported on 03/14/2022)    JARDIANCE 25 MG TABS tablet Take 25 mg daily by mouth. (Patient not taking: Reported on 03/14/2022)   lisinopril (ZESTRIL) 5 MG tablet Take 5 mg by mouth daily. (Patient not taking: Reported on 03/14/2022)   ondansetron (ZOFRAN) 4 MG tablet Take 1 tablet (4 mg total) by mouth every 8 (eight) hours as needed for nausea or vomiting. (Patient not taking: Reported on 03/14/2022)   terconazole (TERAZOL 7) 0.4 % vaginal cream Place 1 applicator vaginally at bedtime. (Patient not taking: Reported on 03/14/2022)   No facility-administered medications prior to visit.    Review of Systems  Constitutional:  Negative for fatigue and fever.  Respiratory:  Negative for cough and shortness of breath.   Cardiovascular:  Negative for chest pain and leg swelling.  Gastrointestinal:  Positive for abdominal pain.  Genitourinary:  Positive for dysuria and frequency.  Neurological:  Negative for dizziness and headaches.       Objective    BP 111/62 (BP Location: Right Arm, Patient Position: Sitting, Cuff Size: Large)   Pulse 91   Resp 16   Ht _0  (1.753 m)   Wt 188 lb (85.3 kg)   SpO2 100%   BMI 27.76 kg/m   Physical Exam Vitals reviewed.  Constitutional:      Appearance: She is not ill-appearing.  HENT:     Head: Normocephalic.  Eyes:     Conjunctiva/sclera: Conjunctivae normal.  Cardiovascular:     Rate and Rhythm: Normal  rate.  Pulmonary:     Effort: Pulmonary effort is normal. No respiratory distress.  Abdominal:     Tenderness: There is abdominal tenderness in the epigastric area. There is no guarding.  Neurological:     General: No focal deficit present.     Mental Status: She is alert and oriented to person, place, and time.  Psychiatric:        Mood and Affect: Mood normal.        Behavior: Behavior normal.     Results for orders placed or performed in visit on 03/14/22  POCT Urinalysis Dipstick  Result Value Ref Range   Color, UA brown    Clarity, UA clear    Glucose, UA  Positive (A) Negative   Bilirubin, UA negative    Ketones, UA 40    Spec Grav, UA 1.015 1.010 - 1.025   Blood, UA trace    pH, UA 6.0 5.0 - 8.0   Protein, UA Positive (A) Negative   Urobilinogen, UA 0.2 0.2 or 1.0 E.U./dL   Nitrite, UA positive    Leukocytes, UA Negative Negative   Appearance     Odor      Assessment & Plan     Problem List Items Addressed This Visit       Endocrine   Diabetes mellitus, type 2 ([place])    Pt follows w/ Dr Honor Junes w/ [facility]. Seems uncontrolled. Manages with only lantus; 70 U daily. She is out of her insulin and does not get her next supply until Saturday. Sending 10 mL so she does not go without.  Advised of glucose and ketones in urine-- cautioned for signs of DKA, ED precautions       Relevant Medications   insulin glargine (LANTUS) 100 UNIT/ML injection     Genitourinary   Acute cystitis with hematuria - Primary    UA + nitrites, blood Rx bactrim pobid x 5 days Increase fluids Ordered culture      Relevant Medications   sulfamethoxazole-trimethoprim (BACTRIM DS) 800-160 MG tablet   Other Relevant Orders   Urine Culture     Other   Epigastric pain    Gastritis vs GERD Advised to restart omeprazole in AM, gave samples of famotidine 20 mg that she can also take BID in the meantime  If no improvement w/ either, return to office      Relevant Medications   famotidine (PEPCID) 20 MG tablet   Other Visit Diagnoses     Dysuria       Relevant Orders   POCT Urinalysis Dipstick (Completed)        Return in about 4 weeks (around 04/11/2022) for chronic conditions. W/ pcp     I, Mikey Kirschner, PA-C have reviewed all documentation for this visit. The documentation on  03/14/2022 for the exam, diagnosis, procedures, and orders are all accurate and complete.  Mikey Kirschner, PA-C [facility] [address] [place], [place], [address] Office: [phone] Fax: [place]

## 2022-03-14 NOTE — Assessment & Plan Note (Signed)
Gastritis vs GERD Advised to restart omeprazole in AM, gave samples of famotidine 20 mg that she can also take BID in the meantime  If no improvement w/ either, return to office

## 2022-03-14 NOTE — Telephone Encounter (Signed)
  Chief Complaint: Burning with urination, Foul smelling urine Symptoms: above Frequency: 1 week Pertinent Negatives: Patient denies  Disposition: '[]'$ ED /'[]'$ Urgent Care (no appt availability in office) / '[x]'$ Appointment(In office/virtual)/ '[]'$  [facility]/ '[]'$ Home Care/ '[]'$ Refused Recommended Disposition /'[]'$ [facility]/ '[]'$  Follow-up with PCP Additional Notes: Pt reports, burning with urination, itching, foul smelling urine(smells like fish), and blood on toilet tissue.   Pt also reports abdominal pain when she eats. Pt has been eating very little.   Pt is out of insulin, refill coming on Saturday. Sent refill request .

## 2022-03-14 NOTE — Telephone Encounter (Signed)
Requested medications are due for refill today.  unsure  Requested medications are on the active medications list.  Yes as historical  Last refill. 02/15/2015  Future visit scheduled.   yes  Notes to clinic.  Labs are expired. Medication is historical. Pt has appt today.      Requested Prescriptions  Pending Prescriptions Disp Refills   insulin glargine (LANTUS) 100 UNIT/ML injection 10 mL     Sig: Inject 0.5 mLs (50 Units total) into the skin daily.     Endocrinology:  Diabetes - Insulins Failed - 03/14/2022 11:54 AM      Failed - HBA1C is between 0 and 7.9 and within 180 days    Hemoglobin A1C  Date Value Ref Range Status  06/04/2019 10.5  Final         Failed - Valid encounter within last 6 months    Recent Outpatient Visits           8 months ago [facility] [place], Dionne Bucy, [place]   1 year ago [place], Kelby Aline, [place]   2 years ago Encounter for annual physical exam   [facility], Dionne Bucy, [place]   3 years ago Vaginal candidiasis   [facility] [place], Dionne Bucy, MD   4 years ago Fever, unspecified fever cause   [facility], Dionne Bucy, MD       Future Appointments             Today Mikey Kirschner, PA-C [facility], [place]

## 2022-03-14 NOTE — Assessment & Plan Note (Addendum)
UA + nitrites, blood Rx bactrim pobid x 5 days Increase fluids Ordered culture

## 2022-03-14 NOTE — Telephone Encounter (Signed)
Pt is out of insulin. Pt is expecting Lantus in the mail on Saturday

## 2022-03-14 NOTE — Assessment & Plan Note (Signed)
Pt follows w/ Dr Honor Junes w/ [facility]. Seems uncontrolled. Manages with only lantus; 70 U daily. She is out of her insulin and does not get her next supply until Saturday. Sending 10 mL so she does not go without.  Advised of glucose and ketones in urine-- cautioned for signs of DKA, ED precautions

## 2022-03-17 LAB — URINE CULTURE

## 2022-03-20 ENCOUNTER — Ambulatory Visit: Payer: Self-pay

## 2022-03-20 MED ORDER — FLUCONAZOLE 150 MG PO TABS: 150.0000 mg | ORAL_TABLET | Freq: Once | ORAL | 0 refills | Status: AC

## 2022-03-20 NOTE — Telephone Encounter (Signed)
Patient notified of provider response- patient is asking if she can have Rx for cream to go with the pill. She is informed that yeast is not something patient's are kept out of work for- patient understands- but states that was her original complaint- and she could go to work itching like that.

## 2022-03-20 NOTE — Telephone Encounter (Signed)
  Chief Complaint: medication request- vaginal itching Symptoms: itching inside.outside- vagina, recent antibiotic use Frequency: patient states she was having itching at her appointment- but it was not addressed due to UTI Pertinent Negatives: Patient denies vaginal discharge Disposition: '[]'$ ED /'[]'$ Urgent Care (no appt availability in office) / '[]'$ Appointment(In office/virtual)/ '[]'$  [facility]/ '[]'$ Home Care/ '[x]'$ Refused Recommended Disposition /'[]'$ [facility]/ '[]'$  Follow-up with PCP Additional Notes: Patient states she would like treatment for yeats- she does not want to come to office- she is also requesting an extension to her out of work letter- she has not been able to work due to itching- she wants/has to go in tonight- if she can get treatment.    Reason for Disposition  MODERATE-SEVERE itching (i.e., interferes with school, work, or sleep)  Answer Assessment - Initial Assessment Questions 1. SYMPTOM: "What's the main symptom you're concerned about?" (e.g., pain, itching, dryness)     Itching- inside/outside vagina 2. LOCATION: "Where is the  itching located?" (e.g., inside/outside, left/right)     See above 3. ONSET: "When did the  itching  start?"     10/14/21- itching was present at visit- but did not get medication for that 4. PAIN: "Is there any pain?" If Yes, ask: "How bad is it?" (Scale: 1-10; mild, moderate, severe)   -  MILD (1-3): Doesn't interfere with normal activities.    -  MODERATE (4-7): Interferes with normal activities (e.g., work or school) or awakens from sleep.     -  SEVERE (8-10): Excruciating pain, unable to do any normal activities.     no 5. ITCHING: "Is there any itching?" If Yes, ask: "How bad is it?" (Scale: 1-10; mild, moderate, severe)     Yes- moderate 6. CAUSE: "What do you think is causing the discharge?" "Have you had the same problem before? What happened then?"     Possible yeast 7. OTHER SYMPTOMS: "Do you have any other symptoms?"  (e.g., fever, itching, vaginal bleeding, pain with urination, injury to genital area, vaginal foreign body)     No other symptoms, headache 8. PREGNANCY: "Is there any chance you are pregnant?" "When was your last menstrual period?"      na  Protocols used: Vaginal Symptoms-A-AH

## 2022-03-20 NOTE — Telephone Encounter (Signed)
Ok to send in fluconazole 1 pill PO once #1 r0 for yeast. Is she asking for a work note for her yeast infection? This is not typically something we keep people out of work for

## 2022-03-20 NOTE — Telephone Encounter (Signed)
Diflucan sent to [facility].

## 2022-03-20 NOTE — Telephone Encounter (Signed)
Lmtcb with pharmacy information. PEC please note.

## 2022-03-20 NOTE — Telephone Encounter (Signed)
She doesn't need a cream too. The pill is sufficient.  She can make an appointment if we need to assess further

## 2022-03-20 NOTE — Telephone Encounter (Signed)
Patient called, left VM to return the call to the office to discuss symptoms with a nurse.  Summary: personal discomfort / rx req   The patient is still experiencing personal discomfort and skin irritation  The patient has been previously seen for their concerns and prescribed medication but shares that their discomfort continues  The patient would like to be prescribed a new medication when possible  Please contact further when available

## 2022-04-17 ENCOUNTER — Ambulatory Visit: Payer: 59 | Admitting: Family Medicine

## 2022-04-17 NOTE — Progress Notes (Deleted)
      Established patient visit   Patient: Lisa [name]   DOB: [birth year]   61 y.o. Female  MRN: [id number] Visit Date: 04/17/2022  Today's healthcare provider: Eulis Foster, MD   No chief complaint on file.  Subjective    Abdominal Pain   ***  Medications: Outpatient Medications Prior to Visit  Medication Sig  . aspirin 81 MG tablet Take 1 tablet by mouth daily.  Marland Kitchen atorvastatin (LIPITOR) 80 MG tablet Take 1 tablet (80 mg total) by mouth daily at 6 PM.  . Blood Glucose Monitoring Suppl (FIFTY50 GLUCOSE METER 2.0) W/DEVICE KIT Use as directed.  . clopidogrel (PLAVIX) 75 MG tablet Take 75 mg by mouth daily. (Patient not taking: Reported on 03/14/2022)  . famotidine (PEPCID) 20 MG tablet Take 1 tablet (20 mg total) by mouth 2 (two) times daily.  Marland Kitchen glyBURIDE (DIABETA) 2.5 MG tablet Take 1 tablet by mouth daily. (Patient not taking: Reported on 03/14/2022)  . insulin glargine (LANTUS) 100 UNIT/ML injection Inject 0.7 mLs (70 Units total) into the skin daily.  Marland Kitchen JARDIANCE 25 MG TABS tablet Take 25 mg daily by mouth. (Patient not taking: Reported on 03/14/2022)  . lisinopril (ZESTRIL) 5 MG tablet Take 5 mg by mouth daily. (Patient not taking: Reported on 03/14/2022)  . metoprolol succinate (TOPROL-XL) 25 MG 24 hr tablet Take 25 mg by mouth daily.  . metoprolol tartrate (LOPRESSOR) 25 MG tablet Take by mouth.  . MULTIPLE VITAMINS-MINERALS PO Take 1 tablet by mouth daily.  Marland Kitchen omeprazole (PRILOSEC) 40 MG capsule TAKE 1 CAPSULE BY MOUTH  DAILY  . ondansetron (ZOFRAN) 4 MG tablet Take 1 tablet (4 mg total) by mouth every 8 (eight) hours as needed for nausea or vomiting. (Patient not taking: Reported on 03/14/2022)  . sulfamethoxazole-trimethoprim (BACTRIM DS) 800-160 MG tablet Take 1 tablet by mouth 2 (two) times daily.  Marland Kitchen terconazole (TERAZOL 7) 0.4 % vaginal cream Place 1 applicator vaginally at bedtime. (Patient not taking: Reported on 03/14/2022)  . ursodiol (ACTIGALL) 300  MG capsule Take 300 mg by mouth daily.    No facility-administered medications prior to visit.    Review of Systems  Gastrointestinal:  Positive for abdominal pain.   {Labs  Heme  Chem  Endocrine  Serology  Results Review (optional):23779}   Objective    There were no vitals taken for this visit. {Show previous vital signs (optional):23777}  Physical Exam  ***  No results found for any visits on 04/17/22.  Assessment & Plan     ***  No follow-ups on file.      {provider attestation***:1}   Eulis Foster, MD  [facility] [phone] (phone) [phone] (fax)  [place]

## 2022-04-18 LAB — HEMOGLOBIN A1C: Hemoglobin A1C: 12.8

## 2022-05-05 ENCOUNTER — Emergency Department
Admission: EM | Admit: 2022-05-05 | Discharge: 2022-05-05 | Disposition: A | Discharge status: Home / Self Care | Payer: 59 | Attending: Emergency Medicine | Admitting: Emergency Medicine

## 2022-05-05 ENCOUNTER — Encounter: Payer: Self-pay | Admitting: Emergency Medicine

## 2022-05-05 ENCOUNTER — Other Ambulatory Visit: Payer: Self-pay

## 2022-05-05 DIAGNOSIS — R112 Nausea with vomiting, unspecified: Secondary | ICD-10-CM | POA: Insufficient documentation

## 2022-05-05 DIAGNOSIS — R7401 Elevation of levels of liver transaminase levels: Secondary | ICD-10-CM | POA: Insufficient documentation

## 2022-05-05 DIAGNOSIS — Z20822 Contact with and (suspected) exposure to covid-19: Secondary | ICD-10-CM | POA: Insufficient documentation

## 2022-05-05 DIAGNOSIS — N898 Other specified noninflammatory disorders of vagina: Secondary | ICD-10-CM

## 2022-05-05 DIAGNOSIS — B9689 Other specified bacterial agents as the cause of diseases classified elsewhere: Secondary | ICD-10-CM | POA: Insufficient documentation

## 2022-05-05 DIAGNOSIS — R197 Diarrhea, unspecified: Secondary | ICD-10-CM | POA: Insufficient documentation

## 2022-05-05 DIAGNOSIS — R1030 Lower abdominal pain, unspecified: Secondary | ICD-10-CM | POA: Diagnosis present

## 2022-05-05 DIAGNOSIS — N390 Urinary tract infection, site not specified: Secondary | ICD-10-CM | POA: Insufficient documentation

## 2022-05-05 DIAGNOSIS — R7989 Other specified abnormal findings of blood chemistry: Secondary | ICD-10-CM

## 2022-05-05 LAB — CBC WITH DIFFERENTIAL/PLATELET
Abs Immature Granulocytes: 0.07 10*3/uL (ref 0.00–0.07)
Basophils Absolute: 0 10*3/uL (ref 0.0–0.1)
Basophils Relative: 0 %
Eosinophils Absolute: 0.1 10*3/uL (ref 0.0–0.5)
Eosinophils Relative: 1 %
HCT: 39.8 % (ref 36.0–46.0)
Hemoglobin: 12.1 g/dL (ref 12.0–15.0)
Immature Granulocytes: 1 %
Lymphocytes Relative: 15 %
Lymphs Abs: 1.6 10*3/uL (ref 0.7–4.0)
MCH: 25.9 pg — ABNORMAL LOW (ref 26.0–34.0)
MCHC: 30.4 g/dL (ref 30.0–36.0)
MCV: 85 fL (ref 80.0–100.0)
Monocytes Absolute: 0.7 10*3/uL (ref 0.1–1.0)
Monocytes Relative: 7 %
Neutro Abs: 8.1 10*3/uL — ABNORMAL HIGH (ref 1.7–7.7)
Neutrophils Relative %: 76 %
Platelets: 174 10*3/uL (ref 150–400)
RBC: 4.68 MIL/uL (ref 3.87–5.11)
RDW: 15.6 % — ABNORMAL HIGH (ref 11.5–15.5)
Smear Review: NORMAL
WBC: 10.6 10*3/uL — ABNORMAL HIGH (ref 4.0–10.5)
nRBC: 0 % (ref 0.0–0.2)

## 2022-05-05 LAB — URINALYSIS, ROUTINE W REFLEX MICROSCOPIC
Glucose, UA: >=500 mg/dL — AB
Hgb urine dipstick: NEGATIVE
Ketones, ur: NEGATIVE mg/dL
Nitrite: POSITIVE — AB
Protein, ur: 30 mg/dL — AB
Specific Gravity, Urine: 1.022 (ref 1.005–1.030)
pH: 5 (ref 5.0–8.0)

## 2022-05-05 LAB — COMPREHENSIVE METABOLIC PANEL
ALT: 98 U/L — ABNORMAL HIGH (ref 0–44)
AST: 100 U/L — ABNORMAL HIGH (ref 15–41)
Albumin: 3.5 g/dL (ref 3.5–5.0)
Alkaline Phosphatase: 573 U/L — ABNORMAL HIGH (ref 38–126)
Anion gap: 10 (ref 5–15)
BUN: 23 mg/dL — ABNORMAL HIGH (ref 6–20)
CO2: 22 mmol/L (ref 22–32)
Calcium: 9.6 mg/dL (ref 8.9–10.3)
Chloride: 104 mmol/L (ref 98–111)
Creatinine, Ser: 0.73 mg/dL (ref 0.44–1.00)
GFR, Estimated: >60 mL/min (ref >60)
Glucose, Bld: 228 mg/dL — ABNORMAL HIGH (ref 70–99)
Potassium: 3.8 mmol/L (ref 3.5–5.1)
Sodium: 136 mmol/L (ref 135–145)
Total Bilirubin: 4.5 mg/dL — ABNORMAL HIGH (ref 0.3–1.2)
Total Protein: 9.4 g/dL — ABNORMAL HIGH (ref 6.5–8.1)

## 2022-05-05 LAB — RESP PANEL BY RT-PCR (RSV, FLU A&B, COVID)  RVPGX2
Influenza A by PCR: NEGATIVE
Influenza B by PCR: NEGATIVE
Resp Syncytial Virus by PCR: NEGATIVE
SARS Coronavirus 2 by RT PCR: NEGATIVE

## 2022-05-05 LAB — LIPASE, BLOOD: Lipase: 40 U/L (ref 11–51)

## 2022-05-05 MED ORDER — TERCONAZOLE 0.4 % VA CREA: 1.0000 | TOPICAL_CREAM | Freq: Every day | VAGINAL | 0 refills | Status: DC

## 2022-05-05 MED ORDER — CEPHALEXIN 500 MG PO CAPS
500.0000 mg | ORAL_CAPSULE | Freq: Once | ORAL | Status: AC
  Administered 2022-05-05: 500 mg via ORAL
  Filled 2022-05-05: qty 1

## 2022-05-05 MED ORDER — ONDANSETRON 4 MG PO TBDP
4.0000 mg | ORAL_TABLET | Freq: Once | ORAL | Status: AC
  Administered 2022-05-05: 4 mg via ORAL
  Filled 2022-05-05: qty 1

## 2022-05-05 MED ORDER — CEPHALEXIN 500 MG PO CAPS: 500.0000 mg | ORAL_CAPSULE | Freq: Four times a day (QID) | ORAL | 0 refills | Status: AC

## 2022-05-05 NOTE — ED Provider Notes (Signed)
[facility] Provider Note    Event Date/Time   First MD Initiated Contact with Patient 05/05/22 0601     (approximate)   History   Emesis   HPI  Lisa [name] is a 61 y.o. female who presents for evaluation of some nausea, vomiting, and diarrhea over the last 5 days as well as some generalized lower abdominal pain.  She has a history of elevated liver enzymes and NASH, but she is not having pain in the upper abdomen.  She has had no respiratory issues.  She has had burning when she urinates and increased urinary frequency.  She also believes that she has a yeast infection because she has gotten them in the past and she has some itching and burning on the outside.  She said the Diflucan and other over-the-counter medications do not help, but she has used a prescription antifungal "that starts with a T" in the past that works well.  She has not had a fever.  She has had urinary tract infections in the past.     Physical Exam   Triage Vital Signs: ED Triage Vitals  Enc Vitals Group     BP 05/05/22 0110 129/72     Pulse Rate 05/05/22 0110 78     Resp 05/05/22 0110 18     Temp 05/05/22 0110 98.1 F (36.7 C)     Temp Source 05/05/22 0110 Oral     SpO2 05/05/22 0110 96 %     Weight 05/05/22 0112 80.3 kg (177 lb)     Height 05/05/22 0112 1.753 m ('5\' 9"'$ )     Head Circumference --      Peak Flow --      Pain Score 05/05/22 0112 10     Pain Loc --      Pain Edu? --      Excl. in [place]? --     Most recent vital signs: Vitals:   05/05/22 0110 05/05/22 0327  BP: 129/72 111/68  Pulse: 78 74  Resp: 18 17  Temp: 98.1 F (36.7 C) 98.3 F (36.8 C)  SpO2: 96% 100%     General: Awake, no distress.  CV:  Good peripheral perfusion.  Regular rate and rhythm, normal heart sounds. Resp:  Normal effort.  Lungs clear to auscultation. Abd:  No distention.  No tenderness to palpation throughout the abdomen including the lower abdomen, suprapubic, upper, and  right upper quadrant. Other:  Ambulatory without difficulty.  Not actively vomiting.   ED Results / Procedures / Treatments   Labs (all labs ordered are listed, but only abnormal results are displayed) Labs Reviewed  CBC WITH DIFFERENTIAL/PLATELET - Abnormal; Notable for the following components:      Result Value   WBC 10.6 (*)    MCH 25.9 (*)    RDW 15.6 (*)    Neutro Abs 8.1 (*)    All other components within normal limits  COMPREHENSIVE METABOLIC PANEL - Abnormal; Notable for the following components:   Glucose, Bld 228 (*)    BUN 23 (*)    Total Protein 9.4 (*)    AST 100 (*)    ALT 98 (*)    Alkaline Phosphatase 573 (*)    Total Bilirubin 4.5 (*)    All other components within normal limits  URINALYSIS, ROUTINE W REFLEX MICROSCOPIC - Abnormal; Notable for the following components:   Color, Urine AMBER (*)    APPearance CLOUDY (*)    Glucose, UA >=  500 (*)    Bilirubin Urine SMALL (*)    Protein, ur 30 (*)    Nitrite POSITIVE (*)    Leukocytes,Ua TRACE (*)    Bacteria, UA MANY (*)    All other components within normal limits  RESP PANEL BY RT-PCR (RSV, FLU A&B, COVID)  [id number]  URINE CULTURE  LIPASE, BLOOD     PROCEDURES:  Critical Care performed: No  Procedures   MEDICATIONS ORDERED IN ED: Medications  ondansetron (ZOFRAN-ODT) disintegrating tablet 4 mg (4 mg Oral Given 05/05/22 0726)  cephALEXin (KEFLEX) capsule 500 mg (500 mg Oral Given 05/05/22 0726)     IMPRESSION / MDM / ASSESSMENT AND PLAN / ED COURSE  I reviewed the triage vital signs and the nursing notes.                              Differential diagnosis includes, but is not limited to, viral infection, bacterial infection including UTI, vulvovaginal candidiasis, viral gastroenteritis, acute intra-abdominal infection.  Patient's presentation is most consistent with acute presentation with potential threat to life or bodily function.  Lab/studies ordered: Respiratory viral panel,  urinalysis, urine culture, CMP, lipase, CBC with differential.  Vital signs are stable and within normal limits.  Patient is generally well appearing, ambulating without difficulty, and has not had any vomiting for hours.  Initially I was concerned about the patient's LFTs but I verify that these have been elevated in the past have been stable for at least the last year and that she has seen gastroenterologist in the past as well.  Her lipase is normal.  She has no clinically significant leukocytosis.  Of note, she has a strongly positive urinalysis with nitrite positive urine, trace leukocytes, 21-50 WBCs, and many bacteria.  I talked about the plan with the patient and her daughter.  Given no evidence of an emergent medical condition requiring hospitalization, I suggested that we empirically treat the UTI with Keflex.  As per her request I also refilled her terconazole cream.  I gave her strict return precautions should she worsen and she plans to follow-up with her PCP.  I think this is a reasonable plan; initially I considered hospitalization given her LFTs and the nausea/vomiting/diarrhea, but those symptoms seem to have abated and there is no indication she requires further treatment in the hospital at this time.  She and her daughter understand and agree with the plan.        FINAL CLINICAL IMPRESSION(S) / ED DIAGNOSES   Final diagnoses:  Urinary tract infection without hematuria, site unspecified  Elevated LFTs     Rx / DC Orders   ED Discharge Orders          Ordered    terconazole (TERAZOL 7) 0.4 % vaginal cream  Daily at bedtime        05/05/22 0638    cephALEXin (KEFLEX) 500 MG capsule  4 times daily        05/05/22 2563             Note:  This document was prepared using Dragon voice recognition software and may include unintentional dictation errors.   Hinda Kehr, MD 05/05/22 0900

## 2022-05-05 NOTE — ED Triage Notes (Signed)
Patient ambulatory to triage with steady gait, without difficulty or distress noted; pt reports N/V/D and lower abd pain since Monday

## 2022-05-05 NOTE — Discharge Instructions (Addendum)
As we discussed, we believe your symptoms are due to a urinary tract infection.  Since you reported that you frequently get yeast infections as well, we refilled your terconazole cream that has worked for you in the past.  Please take the full course of antibiotics as prescribed unless otherwise directed by your primary care doctor.  Please continue on your regular medications as well.  Drink plenty of fluids and follow-up at the next available opportunity.  Return to the emergency department if you develop new or worsening symptoms that concern you.

## 2022-05-07 LAB — URINE CULTURE: Culture: 50000 — AB

## 2022-05-22 LAB — HM DIABETES EYE EXAM

## 2022-05-26 ENCOUNTER — Encounter: Payer: Self-pay | Admitting: Family Medicine

## 2022-05-30 ENCOUNTER — Emergency Department: Payer: 59

## 2022-05-30 ENCOUNTER — Encounter: Payer: Self-pay | Admitting: Emergency Medicine

## 2022-05-30 ENCOUNTER — Emergency Department
Admission: EM | Admit: 2022-05-30 | Discharge: 2022-05-30 | Disposition: A | Discharge status: Home / Self Care | Payer: 59 | Attending: Emergency Medicine | Admitting: Emergency Medicine

## 2022-05-30 DIAGNOSIS — E119 Type 2 diabetes mellitus without complications: Secondary | ICD-10-CM | POA: Diagnosis not present

## 2022-05-30 DIAGNOSIS — R109 Unspecified abdominal pain: Secondary | ICD-10-CM

## 2022-05-30 DIAGNOSIS — R7989 Other specified abnormal findings of blood chemistry: Secondary | ICD-10-CM | POA: Diagnosis not present

## 2022-05-30 DIAGNOSIS — I10 Essential (primary) hypertension: Secondary | ICD-10-CM | POA: Diagnosis not present

## 2022-05-30 DIAGNOSIS — R1084 Generalized abdominal pain: Secondary | ICD-10-CM | POA: Diagnosis present

## 2022-05-30 DIAGNOSIS — K567 Ileus, unspecified: Secondary | ICD-10-CM | POA: Insufficient documentation

## 2022-05-30 LAB — COMPREHENSIVE METABOLIC PANEL
ALT: 134 U/L — ABNORMAL HIGH (ref 0–44)
AST: 128 U/L — ABNORMAL HIGH (ref 15–41)
Albumin: 3.4 g/dL — ABNORMAL LOW (ref 3.5–5.0)
Alkaline Phosphatase: 626 U/L — ABNORMAL HIGH (ref 38–126)
Anion gap: 8 (ref 5–15)
BUN: 19 mg/dL (ref 6–20)
CO2: 25 mmol/L (ref 22–32)
Calcium: 9 mg/dL (ref 8.9–10.3)
Chloride: 102 mmol/L (ref 98–111)
Creatinine, Ser: 0.74 mg/dL (ref 0.44–1.00)
GFR, Estimated: >60 mL/min (ref >60)
Glucose, Bld: 127 mg/dL — ABNORMAL HIGH (ref 70–99)
Potassium: 3.2 mmol/L — ABNORMAL LOW (ref 3.5–5.1)
Sodium: 135 mmol/L (ref 135–145)
Total Bilirubin: 2.6 mg/dL — ABNORMAL HIGH (ref 0.3–1.2)
Total Protein: 9.3 g/dL — ABNORMAL HIGH (ref 6.5–8.1)

## 2022-05-30 LAB — CBC
HCT: 40.4 % (ref 36.0–46.0)
Hemoglobin: 12.4 g/dL (ref 12.0–15.0)
MCH: 26.3 pg (ref 26.0–34.0)
MCHC: 30.7 g/dL (ref 30.0–36.0)
MCV: 85.8 fL (ref 80.0–100.0)
Platelets: 196 10*3/uL (ref 150–400)
RBC: 4.71 MIL/uL (ref 3.87–5.11)
RDW: 14.8 % (ref 11.5–15.5)
WBC: 8.7 10*3/uL (ref 4.0–10.5)
nRBC: 0 % (ref 0.0–0.2)

## 2022-05-30 LAB — CBG MONITORING, ED: Glucose-Capillary: 106 mg/dL — ABNORMAL HIGH (ref 70–99)

## 2022-05-30 LAB — LIPASE, BLOOD: Lipase: 44 U/L (ref 11–51)

## 2022-05-30 MED ORDER — LORAZEPAM 2 MG/ML IJ SOLN
1.0000 mg | Freq: Once | INTRAMUSCULAR | Status: AC
  Administered 2022-05-30: 1 mg via INTRAVENOUS
  Filled 2022-05-30: qty 1

## 2022-05-30 MED ORDER — METOCLOPRAMIDE HCL 10 MG PO TABS: 10.0000 mg | ORAL_TABLET | Freq: Three times a day (TID) | ORAL | 0 refills | Status: AC | PRN

## 2022-05-30 MED ORDER — KETOROLAC TROMETHAMINE 30 MG/ML IJ SOLN
15.0000 mg | Freq: Once | INTRAMUSCULAR | Status: AC
  Administered 2022-05-30: 15 mg via INTRAVENOUS
  Filled 2022-05-30: qty 1

## 2022-05-30 MED ORDER — SODIUM CHLORIDE 0.9 % IV BOLUS: 1000.0000 mL | Freq: Once | INTRAVENOUS | Status: DC

## 2022-05-30 MED ORDER — ONDANSETRON HCL 4 MG/2ML IJ SOLN
4.0000 mg | INTRAMUSCULAR | Status: AC
  Administered 2022-05-30: 4 mg via INTRAVENOUS
  Filled 2022-05-30: qty 2

## 2022-05-30 MED ORDER — METOCLOPRAMIDE HCL 5 MG/ML IJ SOLN
10.0000 mg | Freq: Once | INTRAMUSCULAR | Status: AC
  Administered 2022-05-30: 10 mg via INTRAVENOUS
  Filled 2022-05-30: qty 2

## 2022-05-30 MED ORDER — FENTANYL CITRATE PF 50 MCG/ML IJ SOSY: 25.0000 ug | PREFILLED_SYRINGE | Freq: Once | INTRAMUSCULAR | Status: DC

## 2022-05-30 MED ORDER — IOHEXOL 300 MG/ML  SOLN
100.0000 mL | Freq: Once | INTRAMUSCULAR | Status: AC | PRN
  Administered 2022-05-30: 100 mL via INTRAVENOUS

## 2022-05-30 MED ORDER — MORPHINE SULFATE (PF) 4 MG/ML IV SOLN: 4.0000 mg | Freq: Once | INTRAVENOUS | Status: DC

## 2022-05-30 MED ORDER — SODIUM CHLORIDE 0.9 % IV BOLUS
500.0000 mL | Freq: Once | INTRAVENOUS | Status: AC
  Administered 2022-05-30: 500 mL via INTRAVENOUS

## 2022-05-30 NOTE — Discharge Instructions (Signed)
As we discussed please return to the emergency department for any vomiting, return of or worsening abdominal pain, fever or any other symptom personally concerning to yourself.

## 2022-05-30 NOTE — ED Triage Notes (Signed)
Patient to ED via POV for mid abd pain with diarrhea that started yesterday. Patient states symptoms start after taking her mounjaro shots- intermittent for the past 5 weeks since starting medication.

## 2022-05-30 NOTE — ED Provider Notes (Signed)
[facility] Provider Note    Event Date/Time   First MD Initiated Contact with Patient 05/30/22 1037     (approximate)  History   Chief Complaint: Abdominal Pain  HPI  Lisa [name] is a 62 y.o. female with a past medical history of cirrhosis, diabetes, hypertension, presents emergency department for abdominal pain and diarrhea.  According to the patient for the past 3 to 4 weeks she has been experiencing vague abdominal discomfort as well as frequent episodes of diarrhea.  States her symptoms started shortly after starting Mounjaro injections.  Patient states she has been receiving weekly injections including this past Monday.  She has been having several episodes of loose stool per day states no appetite as well as diffuse abdominal discomfort.  Patient denies any vomiting.  Denies any fever.  Physical Exam   Triage Vital Signs: ED Triage Vitals  Enc Vitals Group     BP 05/30/22 0921 130/79     Pulse Rate 05/30/22 0921 81     Resp 05/30/22 0921 18     Temp 05/30/22 0921 98.6 F (37 C)     Temp Source 05/30/22 0921 Oral     SpO2 05/30/22 0921 100 %     Weight --      Height --      Head Circumference --      Peak Flow --      Pain Score 05/30/22 0922 10     Pain Loc --      Pain Edu? --      Excl. in [place]? --     Most recent vital signs: Vitals:   05/30/22 0921  BP: 130/79  Pulse: 81  Resp: 18  Temp: 98.6 F (37 C)  SpO2: 100%    General: Awake, no distress.  CV:  Good peripheral perfusion.  Regular rate and rhythm  Resp:  Normal effort. Equal breath sounds bilaterally.  Abd:  No distention. Soft, nontender.  No rebound or guarding.  ED Results / Procedures / Treatments   RADIOLOGY  I have reviewed and interpreted the CT images.  Patient appears to have dilated small bowel loops throughout the abdomen. Radiology has read the CT scan as small bowel dilation concerning for ileus versus distal small bowel obstruction although no  definitive bowel obstruction seen.   MEDICATIONS ORDERED IN ED: Medications  sodium chloride 0.9 % bolus 1,000 mL (has no administration in time range)  metoCLOPramide (REGLAN) injection 10 mg (has no administration in time range)  ketorolac (TORADOL) 30 MG/ML injection 15 mg (has no administration in time range)  sodium chloride 0.9 % bolus 500 mL (500 mLs Intravenous New Bag/Given 05/30/22 0945)  ondansetron (ZOFRAN) injection 4 mg (4 mg Intravenous Given 05/30/22 0945)  iohexol (OMNIPAQUE) 300 MG/ML solution 100 mL (100 mLs Intravenous Contrast Given 05/30/22 1019)     IMPRESSION / MDM / ASSESSMENT AND PLAN / ED COURSE  I reviewed the triage vital signs and the nursing notes.  Patient's presentation is most consistent with acute presentation with potential threat to life or bodily function.  \Patient presents to the emergency department for abdominal discomfort and diarrhea over the past 3 to 4 weeks.  Patient states symptoms started shortly after starting Mounjaro injections.  Patient has been receiving injections once a week including this past Monday.  Patient's lab work is overall reassuring.  Patient CBC is normal with a normal white blood cell count.  Patient's chemistry does show elevated LFTs however  unchanged from historical values.  Lipase is normal.  CT scan is concerning for possible ileus versus obstruction although no definitive obstruction seen.  Patient is having diarrhea multiple times per day.  Suspect the CT scan is more consistent with an ileus.  However given the ileus the abdominal discomfort and diarrhea I recommended admission to the hospital for further workup treatment and IV hydration.  Patient strongly wishes to go home.  We will dose IV fluids in the emergency department we will dose IV Reglan.  Patient states she is going to stop the injections.  Will discharge with oral Reglan.  Patient understands if the pain fails to resolve if she begins to be nauseated or  vomiting or if she spikes a fever she should return immediately to the emergency department.  Otherwise she will follow-up with her doctor this week.  Patient states she is feeling much better.  Still wishes to go home.  Lab work largely [place].  Will place the patient on Reglan to be used as needed for nausea.  I discussed strict return precautions for any vomiting fever or return of abdominal pain.  FINAL CLINICAL IMPRESSION(S) / ED DIAGNOSES   Diarrhea Abdominal discomfort Ileus   Note:  This document was prepared using Dragon voice recognition software and may include unintentional dictation errors.   Harvest Dark, MD 05/30/22 1323

## 2022-05-30 NOTE — ED Provider Triage Note (Signed)
Emergency Medicine Provider Triage Evaluation Note  Lisa [name] , a 62 y.o. female  was evaluated in triage.  Pt complains of abd pain, diarrrhea (loose, non-bloddy) several times today. On Manjuro injections on Monday send has noticed for about the last 4 to 5 weeks that following use of her injection she will have pain and loose stool the next day.  Pain is fairly severe today located in her mid upper abdomen.  Hurts to move  Does not have a known history of pancreatitis.  No fevers.  No urinary symptoms.  Review of Systems  Positive: Abdominal pain nausea loose stool Negative: Bloody stool.  No chest pain no trouble breathing.  No fevers.  Relates some tension relation to use of her diabetes injection on Mondays  Physical Exam  BP 130/79   Pulse 81   Temp 98.6 F (37 C) (Oral)   Resp 18   SpO2 100%  Gen:   Awake, no distress he does however appear in pain holding her upper abdomen with her hand. Resp:  Normal effort clear bilaterally MSK:   Moves extremities without difficulty  Other:  Moderate abdominal pain especially in the mid to epigastric region.    Medical Decision Making  Medically screening exam initiated at 9:29 AM.  Appropriate orders placed.  Lisa [name] was informed that the remainder of the evaluation will be completed by another provider, this initial triage assessment does not replace that evaluation, and the importance of remaining in the ED until their evaluation is complete.  Discussed with patient given she appears to be in notable pain, I have ordered IV fluid, Zofran, and small dose of fentanyl as we await a bed assignment.  Additionally patient understanding agreeable with plan to proceed with CT imaging to evaluate further.  Some concern the patient may have enteritis, colitis, pancreatitis, or other acute intra-abdominal etiology for pain which may or may not be relatable to her Manjuro.  Patient's husband also present, understanding of  the plan   Lisa Kitten, MD 05/30/22 [phone]

## 2022-05-31 ENCOUNTER — Other Ambulatory Visit: Payer: Self-pay

## 2022-05-31 ENCOUNTER — Emergency Department
Admission: EM | Admit: 2022-05-31 | Discharge: 2022-05-31 | Disposition: A | Discharge status: Home / Self Care | Payer: 59 | Attending: Emergency Medicine | Admitting: Emergency Medicine

## 2022-05-31 DIAGNOSIS — R197 Diarrhea, unspecified: Secondary | ICD-10-CM | POA: Insufficient documentation

## 2022-05-31 DIAGNOSIS — R1084 Generalized abdominal pain: Secondary | ICD-10-CM | POA: Insufficient documentation

## 2022-05-31 DIAGNOSIS — E119 Type 2 diabetes mellitus without complications: Secondary | ICD-10-CM | POA: Diagnosis not present

## 2022-05-31 DIAGNOSIS — I10 Essential (primary) hypertension: Secondary | ICD-10-CM | POA: Diagnosis not present

## 2022-05-31 LAB — CBC WITH DIFFERENTIAL/PLATELET
Abs Immature Granulocytes: 0.05 10*3/uL (ref 0.00–0.07)
Basophils Absolute: 0.1 10*3/uL (ref 0.0–0.1)
Basophils Relative: 1 %
Eosinophils Absolute: 0.2 10*3/uL (ref 0.0–0.5)
Eosinophils Relative: 2 %
HCT: 40.5 % (ref 36.0–46.0)
Hemoglobin: 12.6 g/dL (ref 12.0–15.0)
Immature Granulocytes: 1 %
Lymphocytes Relative: 26 %
Lymphs Abs: 2.4 10*3/uL (ref 0.7–4.0)
MCH: 26.8 pg (ref 26.0–34.0)
MCHC: 31.1 g/dL (ref 30.0–36.0)
MCV: 86 fL (ref 80.0–100.0)
Monocytes Absolute: 0.8 10*3/uL (ref 0.1–1.0)
Monocytes Relative: 8 %
Neutro Abs: 5.6 10*3/uL (ref 1.7–7.7)
Neutrophils Relative %: 62 %
Platelets: 178 10*3/uL (ref 150–400)
RBC: 4.71 MIL/uL (ref 3.87–5.11)
RDW: 14.9 % (ref 11.5–15.5)
Smear Review: NORMAL
WBC: 9 10*3/uL (ref 4.0–10.5)
nRBC: 0 % (ref 0.0–0.2)

## 2022-05-31 LAB — COMPREHENSIVE METABOLIC PANEL
ALT: 133 U/L — ABNORMAL HIGH (ref 0–44)
AST: 117 U/L — ABNORMAL HIGH (ref 15–41)
Albumin: 3.5 g/dL (ref 3.5–5.0)
Alkaline Phosphatase: 634 U/L — ABNORMAL HIGH (ref 38–126)
Anion gap: 12 (ref 5–15)
BUN: 15 mg/dL (ref 6–20)
CO2: 23 mmol/L (ref 22–32)
Calcium: 9.1 mg/dL (ref 8.9–10.3)
Chloride: 101 mmol/L (ref 98–111)
Creatinine, Ser: 0.68 mg/dL (ref 0.44–1.00)
GFR, Estimated: >60 mL/min (ref >60)
Glucose, Bld: 112 mg/dL — ABNORMAL HIGH (ref 70–99)
Potassium: 3.1 mmol/L — ABNORMAL LOW (ref 3.5–5.1)
Sodium: 136 mmol/L (ref 135–145)
Total Bilirubin: 2.3 mg/dL — ABNORMAL HIGH (ref 0.3–1.2)
Total Protein: 9.3 g/dL — ABNORMAL HIGH (ref 6.5–8.1)

## 2022-05-31 LAB — LIPASE, BLOOD: Lipase: 53 U/L — ABNORMAL HIGH (ref 11–51)

## 2022-05-31 MED ORDER — ONDANSETRON HCL 4 MG/2ML IJ SOLN
4.0000 mg | Freq: Once | INTRAMUSCULAR | Status: AC
  Administered 2022-05-31: 4 mg via INTRAVENOUS

## 2022-05-31 MED ORDER — FENTANYL CITRATE PF 50 MCG/ML IJ SOSY
PREFILLED_SYRINGE | INTRAMUSCULAR | Status: AC
  Filled 2022-05-31: qty 1

## 2022-05-31 MED ORDER — PANTOPRAZOLE SODIUM 40 MG PO TBEC: 40.0000 mg | DELAYED_RELEASE_TABLET | Freq: Every day | ORAL | 0 refills | Status: AC

## 2022-05-31 MED ORDER — FENTANYL CITRATE PF 50 MCG/ML IJ SOSY
50.0000 ug | PREFILLED_SYRINGE | INTRAMUSCULAR | Status: DC | PRN
  Administered 2022-05-31: 50 ug via INTRAVENOUS

## 2022-05-31 MED ORDER — LACTATED RINGERS IV BOLUS
1000.0000 mL | Freq: Once | INTRAVENOUS | Status: AC
  Administered 2022-05-31: 1000 mL via INTRAVENOUS

## 2022-05-31 MED ORDER — PANTOPRAZOLE SODIUM 40 MG IV SOLR
40.0000 mg | Freq: Once | INTRAVENOUS | Status: AC
  Administered 2022-05-31: 40 mg via INTRAVENOUS
  Filled 2022-05-31: qty 10

## 2022-05-31 MED ORDER — ONDANSETRON HCL 4 MG/2ML IJ SOLN
INTRAMUSCULAR | Status: AC
  Filled 2022-05-31: qty 2

## 2022-05-31 MED ORDER — METOCLOPRAMIDE HCL 5 MG/ML IJ SOLN
10.0000 mg | INTRAMUSCULAR | Status: AC
  Administered 2022-05-31: 10 mg via INTRAVENOUS
  Filled 2022-05-31: qty 2

## 2022-05-31 NOTE — ED Triage Notes (Addendum)
C/O abdominal pain.  Seen yesterday for same.  Was to be admitted for ileus, but patient did not want to stay.for admission.  Returns for c/o continued abdominal pain, vomiting, diarrhea.

## 2022-05-31 NOTE — ED Notes (Signed)
Pt given discharge instructions, and voiced understanding. Pt unable to sing due to signing pad not working.

## 2022-05-31 NOTE — ED Provider Triage Note (Signed)
Emergency Medicine Provider Triage Evaluation Note  Lisa [name] , a 62 y.o. female  was evaluated in triage.  Pt complains of abd pain that began Monday. Came yesterday and was diagnosed with ileus. Admission recommended but patient declined. +vomiting +diarrhea. No history of abdominal surgery. No fever.   Review of Systems  Positive: Abd pain, n/v/d Negative: fever  Physical Exam  There were no vitals taken for this visit. Gen:   Awake, appears uncomfortable Resp:  Normal effort  MSK:   Moves extremities without difficulty  Other:    Medical Decision Making  Medically screening exam initiated at 10:40 AM.  Appropriate orders placed.  Allean Found Bamberg was informed that the remainder of the evaluation will be completed by another provider, this initial triage assessment does not replace that evaluation, and the importance of remaining in the ED until their evaluation is complete.     Marquette Old, PA-C 05/31/22 1042

## 2022-05-31 NOTE — ED Notes (Signed)
Pt given ginger ale and crackers per [employer]

## 2022-05-31 NOTE — ED Provider Notes (Signed)
[facility] Provider Note    Event Date/Time   First MD Initiated Contact with Patient 05/31/22 1119     (approximate)   History   Chief Complaint: Abdominal Pain   HPI  Lisa [name] is a 62 y.o. female with a history of diabetes, hypertension, GERD, cirrhosis who comes ED complaining of generalized abdominal pain.  Was seen here yesterday for similar symptoms with intractable vomiting, found to have a possible ileus on CT scan, but declined admission yesterday.  Today symptoms have persisted.  She is having diarrhea and passing flatus.  Denies chest pain or fever or lightheadedness.     Physical Exam   Triage Vital Signs: ED Triage Vitals  Enc Vitals Group     BP 05/31/22 1046 138/72     Pulse Rate 05/31/22 1042 99     Resp 05/31/22 1042 16     Temp 05/31/22 1042 98.4 F (36.9 C)     Temp Source 05/31/22 1042 Oral     SpO2 05/31/22 1042 96 %     Weight 05/31/22 1040 176 lb 12.9 oz (80.2 kg)     Height 05/31/22 1040 '5\' 9"'$  (1.753 m)     Head Circumference --      Peak Flow --      Pain Score 05/31/22 1040 10     Pain Loc --      Pain Edu? --      Excl. in [place]? --     Most recent vital signs: Vitals:   05/31/22 1200 05/31/22 1224  BP: (!) 130/55 (!) 130/55  Pulse: 71 68  Resp:  16  Temp:  98.6 F (37 C)  SpO2: 100% 99%    General: Awake, no distress.  CV:  Good peripheral perfusion.  Regular rate and rhythm Resp:  Normal effort.  Clear to auscultation bilaterally Abd:    Soft with generalized tenderness.  No peritoneal signs.  Nondistended Other:  EMS moist oral mucosa.  No rash, no lower extremity edema fever   ED Results / Procedures / Treatments   Labs (all labs ordered are listed, but only abnormal results are displayed) Labs Reviewed  COMPREHENSIVE METABOLIC PANEL - Abnormal; Notable for the following components:      Result Value   Potassium 3.1 (*)    Glucose, Bld 112 (*)    Total Protein 9.3 (*)    AST 117  (*)    ALT 133 (*)    Alkaline Phosphatase 634 (*)    Total Bilirubin 2.3 (*)    All other components within normal limits  LIPASE, BLOOD - Abnormal; Notable for the following components:   Lipase 53 (*)    All other components within normal limits  CBC WITH DIFFERENTIAL/PLATELET  URINALYSIS, ROUTINE W REFLEX MICROSCOPIC     EKG    RADIOLOGY    PROCEDURES:  Procedures   MEDICATIONS ORDERED IN ED: Medications  fentaNYL (SUBLIMAZE) injection 50 mcg ( Intravenous Not Given 05/31/22 1111)  ondansetron (ZOFRAN) injection 4 mg ( Intravenous Not Given 05/31/22 1111)  metoCLOPramide (REGLAN) injection 10 mg (10 mg Intravenous Given 05/31/22 1137)  lactated ringers bolus 1,000 mL (0 mLs Intravenous Stopped 05/31/22 1317)  pantoprazole (PROTONIX) injection 40 mg (40 mg Intravenous Given 05/31/22 1202)     IMPRESSION / MDM / ASSESSMENT AND PLAN / ED COURSE  I reviewed the triage vital signs and the nursing notes.  Differential diagnosis includes, but is not limited to, intractable vomiting, ileus, dehydration, AKI, electrolyte abnormality  Patient's presentation is most consistent with acute presentation with potential threat to life or bodily function.  Patient returns to ED for further evaluation of her abdominal pain and vomiting symptoms from yesterday.  Patient given Reglan, Protonix, and IV fluid bolus in the ED, and reports that her symptoms have resolved.  Offered p.o. trial which she is agreeable with.  She was able to eat crackers and drink fluids without any increase in symptoms.  She feels comfortable with discharge home.  Will add on PPI to her home Reglan.  I suspect her symptoms may be related to gastritis, and the CT finding of possible ileus is artifactual.  Her symptoms are not consistent with a bowel obstruction.  Stable for discharge         FINAL CLINICAL IMPRESSION(S) / ED DIAGNOSES   Final diagnoses:  Generalized abdominal  pain     Rx / DC Orders   ED Discharge Orders          Ordered    pantoprazole (PROTONIX) 40 MG tablet  Daily        05/31/22 1404             Note:  This document was prepared using Dragon voice recognition software and may include unintentional dictation errors.   Carrie Mew, MD 05/31/22 [phone]

## 2022-06-16 NOTE — Progress Notes (Unsigned)
I,Georgie Haque S Trixie Maclaren,acting as a scribe for Lavon Paganini, MD.,have documented all relevant documentation on the behalf of Lavon Paganini, MD,as directed by  Lavon Paganini, MD while in the presence of Lavon Paganini, MD.     Established patient visit   Patient: Lisa [name]   DOB: 01-30-1961   62 y.o. Female  MRN: [id number] Visit Date: 06/19/2022  Today's healthcare provider: Lavon Paganini, MD   Chief Complaint  Patient presents with   Follow-up   Subjective    HPI  Follow up ER visit  Patient was seen in ER for abdominal pain on 05/30/22-05/31/22. She was treated for generalized abdominal pain. Treatment for this included start pantoprazole 40 mg daily. She reports excellent compliance with treatment. She reports this condition is Unchanged.   Usually havign BMs 4 times weekly, now once per week hard stools. +flatus. Worried about ileus on CT scan -----------------------------------------------------------------------------------------   Medications: Outpatient Medications Prior to Visit  Medication Sig   aspirin 81 MG tablet Take 1 tablet by mouth daily.   insulin glargine (LANTUS) 100 UNIT/ML injection Inject 0.7 mLs (70 Units total) into the skin daily.   metoCLOPramide (REGLAN) 10 MG tablet Take 1 tablet (10 mg total) by mouth every 8 (eight) hours as needed for nausea.   metoprolol succinate (TOPROL-XL) 25 MG 24 hr tablet Take 25 mg by mouth daily.   MULTIPLE VITAMINS-MINERALS PO Take 1 tablet by mouth daily.   omeprazole (PRILOSEC) 40 MG capsule TAKE 1 CAPSULE BY MOUTH  DAILY   pantoprazole (PROTONIX) 40 MG tablet Take 1 tablet (40 mg total) by mouth daily.   rosuvastatin (CRESTOR) 40 MG tablet Take 40 mg by mouth daily.   terconazole (TERAZOL 7) 0.4 % vaginal cream Place 1 applicator vaginally at bedtime.   ursodiol (ACTIGALL) 300 MG capsule Take 300 mg by mouth daily.    [DISCONTINUED] atorvastatin (LIPITOR) 80 MG tablet Take 1 tablet  (80 mg total) by mouth daily at 6 PM.   Blood Glucose Monitoring Suppl (FIFTY50 GLUCOSE METER 2.0) W/DEVICE KIT Use as directed.   metoprolol tartrate (LOPRESSOR) 25 MG tablet Take by mouth.   [DISCONTINUED] clopidogrel (PLAVIX) 75 MG tablet Take 75 mg by mouth daily. (Patient not taking: Reported on 03/14/2022)   [DISCONTINUED] famotidine (PEPCID) 20 MG tablet Take 1 tablet (20 mg total) by mouth 2 (two) times daily.   [DISCONTINUED] glyBURIDE (DIABETA) 2.5 MG tablet Take 1 tablet by mouth daily.   [DISCONTINUED] JARDIANCE 25 MG TABS tablet Take 25 mg by mouth daily.   [DISCONTINUED] lisinopril (ZESTRIL) 5 MG tablet Take 5 mg by mouth daily.   [DISCONTINUED] ondansetron (ZOFRAN) 4 MG tablet Take 1 tablet (4 mg total) by mouth every 8 (eight) hours as needed for nausea or vomiting.   [DISCONTINUED] sulfamethoxazole-trimethoprim (BACTRIM DS) 800-160 MG tablet Take 1 tablet by mouth 2 (two) times daily.   No facility-administered medications prior to visit.    Review of Systems  Constitutional:  Negative for chills and fever.  Respiratory:  Positive for shortness of breath. Negative for cough.   Gastrointestinal:  Positive for abdominal distention, abdominal pain and constipation. Negative for blood in stool, diarrhea, nausea and vomiting.       Objective    BP (!) 162/69 (BP Location: Left Arm, Patient Position: Sitting, Cuff Size: Large)   Pulse 70   Temp 98.2 F (36.8 C) (Temporal)   Resp 16   Wt 184 lb 11.2 oz (83.8 kg)   SpO2 99%   BMI  27.28 kg/m    Physical Exam Vitals reviewed.  Constitutional:      General: She is not in acute distress.    Appearance: Normal appearance. She is well-developed. She is not diaphoretic.  HENT:     Head: Normocephalic and atraumatic.  Eyes:     General: No scleral icterus.    Conjunctiva/sclera: Conjunctivae normal.  Neck:     Thyroid: No thyromegaly.  Cardiovascular:     Rate and Rhythm: Normal rate and regular rhythm.  Pulmonary:      Effort: Pulmonary effort is normal. No respiratory distress.     Breath sounds: Normal breath sounds. No wheezing, rhonchi or rales.  Abdominal:     General: Bowel sounds are normal. There is no distension.     Palpations: Abdomen is soft.     Tenderness: There is abdominal tenderness (mild lower abd).  Musculoskeletal:     Right lower leg: No edema.     Left lower leg: No edema.  Skin:    General: Skin is warm and dry.  Neurological:     Mental Status: She is alert. Mental status is at baseline.       No results found for any visits on 06/19/22.  Assessment & Plan     Problem List Items Addressed This Visit       Cardiovascular and Mediastinum   BP (high blood pressure)    Uncontrolled, elevated today Patient reports more salty food this weekend for the superbowl Will plan to recheck at f/u appt        Digestive   Chronic idiopathic constipation - Primary    Ongoing intermittently Reviewed CT and ED notes - do not think she has ileus or bowel obstruction as she is having flatus and stools  She is constipated and only having 1 BM weekly tht is hard in nature Discussed miralax cleanout but patient is wary Will instead start daily miralax and titrate to one soft BM daily        Other   Lower abdominal pain    Likely 2/2 constipation No red flags Treatment as above        Return in about 4 weeks (around 07/17/2022) for chronic disease f/u.      I, Lavon Paganini, MD, have reviewed all documentation for this visit. The documentation on 06/19/22 for the exam, diagnosis, procedures, and orders are all accurate and complete.   Bacigalupo, Dionne Bucy, MD, MPH [facility]

## 2022-06-19 ENCOUNTER — Encounter: Payer: Self-pay | Admitting: Family Medicine

## 2022-06-19 ENCOUNTER — Ambulatory Visit (INDEPENDENT_AMBULATORY_CARE_PROVIDER_SITE_OTHER): Payer: 59 | Admitting: Family Medicine

## 2022-06-19 VITALS — BP 162/69 | HR 70 | Temp 98.2°F | Resp 16 | Wt 184.7 lb

## 2022-06-19 DIAGNOSIS — I10 Essential (primary) hypertension: Secondary | ICD-10-CM

## 2022-06-19 DIAGNOSIS — K5904 Chronic idiopathic constipation: Secondary | ICD-10-CM

## 2022-06-19 DIAGNOSIS — R1030 Lower abdominal pain, unspecified: Secondary | ICD-10-CM | POA: Diagnosis not present

## 2022-06-19 NOTE — Assessment & Plan Note (Signed)
Ongoing intermittently Reviewed CT and ED notes - do not think she has ileus or bowel obstruction as she is having flatus and stools  She is constipated and only having 1 BM weekly tht is hard in nature Discussed miralax cleanout but patient is wary Will instead start daily miralax and titrate to one soft BM daily

## 2022-06-19 NOTE — Assessment & Plan Note (Signed)
Likely 2/2 constipation No red flags Treatment as above

## 2022-06-19 NOTE — Assessment & Plan Note (Signed)
Uncontrolled, elevated today Patient reports more salty food this weekend for the superbowl Will plan to recheck at f/u appt

## 2022-06-19 NOTE — Patient Instructions (Signed)
Miralax 1 capful daily - titrate to one soft bowel movement daily

## 2022-07-14 NOTE — Progress Notes (Deleted)
I,Anglia Blakley S Tillmon Kisling,acting as a scribe for Lavon Paganini, MD.,have documented all relevant documentation on the behalf of Lavon Paganini, MD,as directed by  Lavon Paganini, MD while in the presence of Lavon Paganini, MD.     Established patient visit   Patient: Lisa [name]   DOB: [birth year]   62 y.o. Female  MRN: [id number] Visit Date: 07/17/2022  Today's healthcare provider: Lavon Paganini, MD   No chief complaint on file.  Subjective    HPI  Diabetes Mellitus Type II, follow-up  Lab Results  Component Value Date   HGBA1C 12.8 04/18/2022   HGBA1C 10.5 06/04/2019   HGBA1C 12.1 (H) 12/26/2017   Last seen for diabetes 4 months ago.  Management since then includes f/b Dr. Honor Junes w/ [facility]. Manages only with lantus; 70 units daily.  She reports {excellent/good/fair/poor:19665} compliance with treatment. She {is/is not:21021397} having side effects. {document side effects if present:1}  Home blood sugar records: {diabetes glucometry results:16657}  Episodes of hypoglycemia? {Yes/No:20286} {enter details if yes:1}   Current insulin regiment: Lantus 70 units daily.  Most Recent Eye Exam: ***  --------------------------------------------------------------------------------------------------- Hypertension, follow-up  BP Readings from Last 3 Encounters:  06/19/22 (!) 162/69  05/31/22 (!) 130/55  05/30/22 122/70   Wt Readings from Last 3 Encounters:  06/19/22 184 lb 11.2 oz (83.8 kg)  05/31/22 176 lb 12.9 oz (80.2 kg)  05/05/22 177 lb (80.3 kg)     She was last seen for hypertension 1 months ago.  BP at that visit was 162/69. Management since that visit includes no changes. She reports {excellent/good/fair/poor:19665} compliance with treatment. She {is/is not:9024} having side effects. {document side effects if present:1}  Outside blood pressures are {enter patient reported home BP, or 'not being  checked':1}. --------------------------------------------------------------------------------------------------- Lipid/Cholesterol, follow-up  Last Lipid Panel: Lab Results  Component Value Date   CHOL 355 (A) 11/05/2018   LDLCALC 207 11/05/2018   HDL 133 (A) 11/05/2018   TRIG 74 11/05/2018    She was last seen for this 4 months ago.  Management since that visit includes no changes. Continue rosuvastatin 40 mg.   She reports {excellent/good/fair/poor:19665} compliance with treatment. She {is/is not:9024} having side effects. {document side effects if present:1}  Symptoms: {Yes/No:20286} appetite changes {Yes/No:20286} foot ulcerations  {Yes/No:20286} chest pain {Yes/No:20286} chest pressure/discomfort  {Yes/No:20286} dyspnea {Yes/No:20286} orthopnea  {Yes/No:20286} fatigue {Yes/No:20286} lower extremity edema  {Yes/No:20286} palpitations {Yes/No:20286} paroxysmal nocturnal dyspnea  {Yes/No:20286} nausea {Yes/No:20286} numbness or tingling of extremity  {Yes/No:20286} polydipsia {Yes/No:20286} polyuria  {Yes/No:20286} speech difficulty {Yes/No:20286} syncope   She is following a {diet:21022986} diet. Current exercise: {exercise 99991111  Last metabolic panel Lab Results  Component Value Date   GLUCOSE 112 (H) 05/31/2022   NA 136 05/31/2022   K 3.1 (L) 05/31/2022   BUN 15 05/31/2022   CREATININE 0.68 05/31/2022   GFRNONAA >60 05/31/2022   CALCIUM 9.1 05/31/2022   AST 117 (H) 05/31/2022   ALT 133 (H) 05/31/2022   The ASCVD Risk score (Arnett DK, et al., 2019) failed to calculate for the following reasons:   Cannot find a previous HDL lab   Cannot find a previous total cholesterol lab  ---------------------------------------------------------------------------------------------------   Medications: Outpatient Medications Prior to Visit  Medication Sig   aspirin 81 MG tablet Take 1 tablet by mouth daily.   Blood Glucose Monitoring Suppl (FIFTY50 GLUCOSE METER  2.0) W/DEVICE KIT Use as directed.   insulin glargine (LANTUS) 100 UNIT/ML injection Inject 0.7 mLs (70 Units total) into the skin daily.  metoCLOPramide (REGLAN) 10 MG tablet Take 1 tablet (10 mg total) by mouth every 8 (eight) hours as needed for nausea.   metoprolol succinate (TOPROL-XL) 25 MG 24 hr tablet Take 25 mg by mouth daily.   metoprolol tartrate (LOPRESSOR) 25 MG tablet Take by mouth.   MULTIPLE VITAMINS-MINERALS PO Take 1 tablet by mouth daily.   omeprazole (PRILOSEC) 40 MG capsule TAKE 1 CAPSULE BY MOUTH  DAILY   pantoprazole (PROTONIX) 40 MG tablet Take 1 tablet (40 mg total) by mouth daily.   rosuvastatin (CRESTOR) 40 MG tablet Take 40 mg by mouth daily.   terconazole (TERAZOL 7) 0.4 % vaginal cream Place 1 applicator vaginally at bedtime.   ursodiol (ACTIGALL) 300 MG capsule Take 300 mg by mouth daily.    No facility-administered medications prior to visit.    Review of Systems  {Labs  Heme  Chem  Endocrine  Serology  Results Review (optional):23779}   Objective    There were no vitals taken for this visit. {Show previous vital signs (optional):23777}  Physical Exam  ***  No results found for any visits on 07/17/22.  Assessment & Plan     ***  No follow-ups on file.      {provider attestation***:1}   Lavon Paganini, MD  [facility] [phone] (phone) [phone] (fax)  [place]

## 2022-07-17 ENCOUNTER — Ambulatory Visit: Payer: 59 | Admitting: Family Medicine

## 2022-07-25 ENCOUNTER — Telehealth: Payer: Self-pay | Admitting: Family Medicine

## 2022-07-25 ENCOUNTER — Other Ambulatory Visit: Payer: Self-pay

## 2022-07-25 MED ORDER — OMEPRAZOLE 40 MG PO CPDR: 40.0000 mg | DELAYED_RELEASE_CAPSULE | Freq: Every day | ORAL | 3 refills | Status: DC

## 2022-07-25 NOTE — Telephone Encounter (Signed)
[facility] faxed refill request for the following medications:   omeprazole (PRILOSEC) 40 MG capsule    Please advise

## 2023-01-01 ENCOUNTER — Ambulatory Visit: Payer: Self-pay | Admitting: *Deleted

## 2023-01-01 NOTE — Telephone Encounter (Signed)
Reason for Disposition  [1] Redness of the skin AND [2] no fever  Answer Assessment - Initial Assessment Questions 1. LOCATION and RADIATION: "Where is the pain located?"      Right- knot at the hip- knot is not large- quarter- cystic 2. QUALITY: "What does the pain feel like?"  (e.g., sharp, dull, aching, burning)     When walking and pressure - pain 3. SEVERITY: "How bad is the pain?" "What does it keep you from doing?"   (Scale 1-10; or mild, moderate, severe)   -  MILD (1-3): doesn't interfere with normal activities    -  MODERATE (4-7): interferes with normal activities (e.g., work or school) or awakens from sleep, limping    -  SEVERE (8-10): excruciating pain, unable to do any normal activities, unable to walk     Mild/moderate 4. ONSET: "When did the pain start?" "Does it come and go, or is it there all the time?"     Noticed last week- smaller and not as painful 5. WORK OR EXERCISE: "Has there been any recent work or exercise that involved this part of the body?"      na 6. CAUSE: "What do you think is causing the hip pain?"      Cyst vs joint pain 7. AGGRAVATING FACTORS: "What makes the hip pain worse?" (e.g., walking, climbing stairs, running)     Walking- pressure makes it painful 8. OTHER SYMPTOMS: "Do you have any other symptoms?" (e.g., back pain, pain shooting down leg,  fever, rash)     Feels pain in leg with walking.  Protocols used: Hip Pain-A-AH

## 2023-01-01 NOTE — Telephone Encounter (Signed)
  Chief Complaint: painful bump on hip- R Symptoms: quarter size bump/cyst on R hip- pain when has pressure or walks- otherwise no pain Frequency: noticed last week- patient reports maybe some increase in size and discomfort Pertinent Negatives: Patient denies falling-injury Disposition: [] ED /[] Urgent Care (no appt availability in office) / [x] Appointment(In office/virtual)/ []  [facility]/ [] Home Care/ [] Refused Recommended Disposition /[] [facility]/ []  Follow-up with PCP Additional Notes: Patient has been given an appointment- advised Tylenol, warm compress until she can be seen and hip area evaluated.

## 2023-01-02 ENCOUNTER — Encounter: Payer: Self-pay | Admitting: Family Medicine

## 2023-01-02 ENCOUNTER — Other Ambulatory Visit (HOSPITAL_COMMUNITY)
Admission: RE | Admit: 2023-01-02 | Discharge: 2023-01-02 | Disposition: A | Discharge status: Home / Self Care | Payer: 59 | Source: Ambulatory Visit | Attending: Family Medicine | Admitting: Family Medicine

## 2023-01-02 ENCOUNTER — Ambulatory Visit: Payer: 59 | Admitting: Family Medicine

## 2023-01-02 VITALS — BP 123/70 | HR 80 | Ht 69.0 in | Wt 187.7 lb

## 2023-01-02 DIAGNOSIS — M25551 Pain in right hip: Secondary | ICD-10-CM | POA: Diagnosis not present

## 2023-01-02 DIAGNOSIS — N760 Acute vaginitis: Secondary | ICD-10-CM

## 2023-01-02 DIAGNOSIS — E1169 Type 2 diabetes mellitus with other specified complication: Secondary | ICD-10-CM | POA: Diagnosis not present

## 2023-01-02 DIAGNOSIS — M7989 Other specified soft tissue disorders: Secondary | ICD-10-CM

## 2023-01-02 NOTE — Progress Notes (Signed)
Acute Office Visit  Subjective:     Patient ID: Lisa [name], female    DOB: Jun 14, 1960, 62 y.o.   MRN: [id number]  Chief Complaint  Patient presents with   Vaginitis    Fish smell w/ yeast infection. Right hip has a bump for about a week causing pain    HPI Discussed the use of AI scribe software for clinical note transcription with the patient, who gave verbal consent to proceed.  History of Present Illness   The patient, with a history of diabetes,, presents with two new concerns. The first is a bump on her R hip, which was discovered a week ago while applying lotion. The bump is not painful, but the patient reports a shooting pain down the outside of her leg. This is the first time the patient has experienced such a symptom.  The second concern is symptoms suggestive of a yeast infection. The patient reports a fishy odor when urinating, itching both inside and outside the vagina, and a failed attempt at self-treatment with over-the-counter medication. The patient attributes the infection to frequent use of a jacuzzi, which she believes may not be advisable for individuals with diabetes.       ROS      Objective:    BP 123/70 (BP Location: Left Arm, Patient Position: Sitting, Cuff Size: Normal)   Pulse 80   Ht 5\' 9"  (1.753 m)   Wt 187 lb 11.2 oz (85.1 kg)   SpO2 98%   BMI 27.72 kg/m    Physical Exam Vitals reviewed.  Constitutional:      General: She is not in acute distress.    Appearance: She is well-developed.  HENT:     Head: Normocephalic and atraumatic.  Eyes:     General: No scleral icterus.    Conjunctiva/sclera: Conjunctivae normal.  Cardiovascular:     Rate and Rhythm: Normal rate and regular rhythm.  Pulmonary:     Effort: Pulmonary effort is normal. No respiratory distress.  Genitourinary:    Comments: GYN:  External genitalia within normal limits.  Vaginal mucosa pink, moist, normal rugae.  Nonfriable cervix without lesions, no  discharge or bleeding noted on speculum exam.  Skin:    General: Skin is warm and dry.  Neurological:     Mental Status: She is alert and oriented to person, place, and time.  Psychiatric:        Behavior: Behavior normal.    Physical Exam   MUSCULOSKELETAL: Bump on right hip, very firm, movable, not attached to bone. Absent on left hip.       No results found for any visits on 01/02/23.      Assessment & Plan:   Problem List Items Addressed This Visit       Endocrine   Diabetes mellitus, type 2 (HCC)   Relevant Orders   Urine Microalbumin w/creat. ratio   Other Visit Diagnoses     Acute vaginitis    -  Primary   Relevant Orders   Cervicovaginal ancillary only   Soft tissue mass       Relevant Orders   DG Hip Unilat W OR W/O Pelvis 2-3 Views Right   [place] RT LOWER EXTREM LTD SOFT TISSUE NON VASCULAR   Right hip pain       Relevant Orders   DG Hip Unilat W OR W/O Pelvis 2-3 Views Right   [place] RT LOWER EXTREM LTD SOFT TISSUE NON VASCULAR  Suspected Yeast Infection vs BV Reports pruritus, discharge, and a fishy odor. History of recurrent yeast infections. -Perform pelvic exam and swab for yeast and BV. -treatment pending results.  Right Hip Mass Newly noticed, firm, mobile mass on the right hip with associated pain radiating down the leg. -Order hip x-ray and ultrasound to further evaluate the mass. -If confirmed as a lipoma and symptomatic, consider surgical removal.  Diabetes Management No recent urine test for proteinuria. -Order urine test to assess for proteinuria.  General Health Maintenance -Schedule a physical exam in the next few months.        No orders of the defined types were placed in this encounter.   Return in about 3 months (around 04/04/2023) for CPE.  Shirlee Latch, MD

## 2023-01-03 ENCOUNTER — Telehealth: Payer: Self-pay | Admitting: Family Medicine

## 2023-01-03 DIAGNOSIS — N898 Other specified noninflammatory disorders of vagina: Secondary | ICD-10-CM

## 2023-01-03 LAB — CERVICOVAGINAL ANCILLARY ONLY
Bacterial Vaginitis (gardnerella): NEGATIVE
Candida Glabrata: NEGATIVE
Candida Vaginitis: POSITIVE — AB
Comment: NEGATIVE
Comment: NEGATIVE
Comment: NEGATIVE

## 2023-01-03 NOTE — Telephone Encounter (Signed)
Pt is calling in because she had an appointment yesterday and had her urine sampled. Pt says she hasn't received the results yet but she is almost certain she has a yeast infection. Pt wants to know if Dr. B can call her in some vaginal cream because per pt, she is scratching so bad she is being to bleed. Please follow up with pt.

## 2023-01-04 ENCOUNTER — Telehealth: Payer: Self-pay

## 2023-01-04 DIAGNOSIS — N898 Other specified noninflammatory disorders of vagina: Secondary | ICD-10-CM

## 2023-01-04 MED ORDER — TERCONAZOLE 0.4 % VA CREA: 1.0000 | TOPICAL_CREAM | Freq: Every day | VAGINAL | 0 refills | Status: DC

## 2023-01-04 MED ORDER — FLUCONAZOLE 150 MG PO TABS: 150.0000 mg | ORAL_TABLET | Freq: Once | ORAL | 0 refills | Status: AC

## 2023-01-04 NOTE — Telephone Encounter (Signed)
-----   Message from Shirlee Latch sent at 01/04/2023  8:10 AM EDT ----- Positive for yeast infection. No BV. Recommend treatment with fluconazole (ok to send in eRx 150mg  once and repeat in 2 days if not cleared #2 r0).

## 2023-01-04 NOTE — Telephone Encounter (Signed)
Patient request refill for terconazole 0.4%.

## 2023-01-22 ENCOUNTER — Ambulatory Visit
Admission: RE | Admit: 2023-01-22 | Discharge: 2023-01-22 | Disposition: A | Discharge status: Home / Self Care | Payer: 59 | Source: Ambulatory Visit | Attending: Family Medicine | Admitting: Family Medicine

## 2023-01-22 DIAGNOSIS — M25551 Pain in right hip: Secondary | ICD-10-CM | POA: Insufficient documentation

## 2023-01-22 DIAGNOSIS — M7989 Other specified soft tissue disorders: Secondary | ICD-10-CM | POA: Insufficient documentation

## 2023-02-07 ENCOUNTER — Other Ambulatory Visit: Payer: Self-pay

## 2023-02-07 DIAGNOSIS — M7989 Other specified soft tissue disorders: Secondary | ICD-10-CM

## 2023-05-10 ENCOUNTER — Encounter: Payer: Self-pay | Admitting: Physician Assistant

## 2023-05-10 ENCOUNTER — Other Ambulatory Visit (HOSPITAL_COMMUNITY)
Admission: RE | Admit: 2023-05-10 | Discharge: 2023-05-10 | Disposition: A | Discharge status: Home / Self Care | Payer: 59 | Source: Ambulatory Visit | Attending: Physician Assistant | Admitting: Physician Assistant

## 2023-05-10 ENCOUNTER — Ambulatory Visit: Payer: Self-pay

## 2023-05-10 ENCOUNTER — Ambulatory Visit: Payer: 59 | Admitting: Physician Assistant

## 2023-05-10 VITALS — BP 132/78 | HR 94 | Temp 98.3°F | Resp 18 | Ht 69.0 in | Wt 193.4 lb

## 2023-05-10 DIAGNOSIS — L292 Pruritus vulvae: Secondary | ICD-10-CM | POA: Diagnosis not present

## 2023-05-10 DIAGNOSIS — J019 Acute sinusitis, unspecified: Secondary | ICD-10-CM | POA: Diagnosis not present

## 2023-05-10 MED ORDER — DOXYCYCLINE HYCLATE 100 MG PO TABS: 100.0000 mg | ORAL_TABLET | Freq: Two times a day (BID) | ORAL | 0 refills | Status: AC

## 2023-05-10 NOTE — Telephone Encounter (Signed)
 Summary: Symptomatic, no appt available soon enough   Pt called requesting to be seen, no appts available. Says she has a sore throat, ears are popping, and head congestion. Best contact: [phone]       Chief Complaint: Sore throat, ears popping Symptoms: Above Frequency: Monday Pertinent Negatives: Patient denies  Disposition: [] ED /[] Urgent Care (no appt availability in office) / [x] Appointment(In office/virtual)/ []  [facility]/ [] Home Care/ [] Refused Recommended Disposition /[] [facility]/ []  Follow-up with PCP Additional Notes: Agrees with appointment.  Reason for Disposition  Earache also present  Answer Assessment - Initial Assessment Questions 1. ONSET: When did the throat start hurting? (Hours or days ago)      Monday 2. SEVERITY: How bad is the sore throat? (Scale 1-10; mild, moderate or severe)   - MILD (1-3):  Doesn't interfere with eating or normal activities.   - MODERATE (4-7): Interferes with eating some solids and normal activities.   - SEVERE (8-10):  Excruciating pain, interferes with most normal activities.   - SEVERE WITH DYSPHAGIA (10): Can't swallow liquids, drooling.     Mild 3. STREP EXPOSURE: Has there been any exposure to strep within the past week? If Yes, ask: What type of contact occurred?      No 4.  VIRAL SYMPTOMS: Are there any symptoms of a cold, such as a runny nose, cough, hoarse voice or red eyes?      Ears popping, head congestion 5. FEVER: Do you have a fever? If Yes, ask: What is your temperature, how was it measured, and when did it start?     Low 6. PUS ON THE TONSILS: Is there pus on the tonsils in the back of your throat?     No 7. OTHER SYMPTOMS: Do you have any other symptoms? (e.g., difficulty breathing, headache, rash)     No 8. PREGNANCY: Is there any chance you are pregnant? When was your last menstrual period?     No  Protocols used: Sore Throat-A-AH

## 2023-05-10 NOTE — Patient Instructions (Signed)
 Based on your symptoms and duration of illness, I believe you may have a bacterial sinus infection  These typically resolve with antibiotic therapy along with at-home comfort measures  Today I have sent in a prescription for Doxycycline  100 mg to be taken by mouth twice per day for 7 days FINISH THE ENTIRE COURSE unless you develop an allergic reaction or are instructed to discontinue.  It can take a few days for the antibiotic to kick in so I recommend symptomatic relief with over the counter medication such as the following: Dayquil/ Nyquil Theraflu Alkaseltzer  Coricidin - if you have high blood pressure even if it is well managed with medications  These medications typically have Tylenol  in them already so you do not need to supplement with more outside of those medications  Stay well hydrated with at least 75 oz of water per day to help with recovery  If you notice any of the following please let us  know: increased fever not responding to Tylenol  or Ibuprofen, swelling around your nose or eyes, difficulty seeing,

## 2023-05-10 NOTE — Progress Notes (Signed)
 Acute Office Visit   Patient: Lisa [name]   DOB: 06/20/1960   63 y.o. Female  MRN: [id number] Visit Date: 05/10/2023  Today's healthcare provider: Rocky BRAVO Jewett Mcgann, PA-C  Introduced myself to the patient as a PA-C and provided education on APPs in clinical practice.    Chief Complaint  Patient presents with   Nasal Congestion    X4 days   Cough   Ear Pain   Subjective    HPI HPI     Nasal Congestion    Additional comments: X4 days      Last edited by Tressa Ogles, CMA on 05/10/2023  3:46 PM.        Discussed the use of AI scribe software for clinical note transcription with the patient, who gave verbal consent to proceed.  History of Present Illness   The patient, a known diabetic, presents with symptoms suggestive of a sinus infection. She reports nasal congestion, ear popping, and intermittent fever. The patient did not measure the fever but reported feeling feverish. She denies any body aches, nausea, vomiting, or diarrhea. However, she reports a slight vaginal itching, a symptom she associates with recurrent yeast infections due to her diabetes.  The patient's symptoms started on Monday and have persisted despite self-management with over-the-counter remedies and increased fluid intake. She denies any recent travel or exposure to sick contacts. She has no history of respiratory conditions such as asthma or COPD.  In addition to the sinus symptoms, the patient reports vaginal itching, a symptom she associates with recurrent yeast infections due to her diabetes. She has not taken any new medications recently. She has a known allergy to the flu vaccine and Cipro. She reports that many antibiotics cause her to experience dizziness and vomiting. She denies any history of severe allergic reactions such as throat or tongue swelling.  The patient has a history of recurrent yeast infections, which have previously been treated with oral medication. She  is familiar with the symptoms and has previously been prescribed a cream for external itching. She has also taken Diflucan  and Fluzone in the past for this condition.       URI -type symptoms   Onset: sudden   Duration: ongoing for 4 days, started on Monday  Associated symptoms: ear fullness and popping, subjective fever,    She also reports slight vulvovaginal itching  Intervention: She has taken Nyquil, Vicks vaporub, Theraflu  She reports minimal relief with these efforts  Recent sick contacts: denies known sick contacts  Recent travel: none  COVID testing at home: She has not tested for COVID at home   Result: NA     Medications: Outpatient Medications Prior to Visit  Medication Sig   aspirin  81 MG tablet Take 1 tablet by mouth daily.   Blood Glucose Monitoring Suppl (FIFTY50 GLUCOSE METER 2.0) W/DEVICE KIT Use as directed.   insulin  glargine (LANTUS ) 100 UNIT/ML injection Inject 0.7 mLs (70 Units total) into the skin daily.   metoCLOPramide  (REGLAN ) 10 MG tablet Take 1 tablet (10 mg total) by mouth every 8 (eight) hours as needed for nausea.   metoprolol  succinate (TOPROL -XL) 25 MG 24 hr tablet Take 25 mg by mouth daily.   metoprolol  tartrate (LOPRESSOR ) 25 MG tablet Take by mouth.   MULTIPLE VITAMINS-MINERALS PO Take 1 tablet by mouth daily.   omeprazole  (PRILOSEC) 40 MG capsule Take 1 capsule (40 mg total) by mouth daily.   rosuvastatin (CRESTOR) 40 MG  tablet Take 40 mg by mouth daily.   terconazole  (TERAZOL 7 ) 0.4 % vaginal cream Place 1 applicator vaginally at bedtime.   ursodiol  (ACTIGALL ) 300 MG capsule Take 300 mg by mouth daily.    pantoprazole  (PROTONIX ) 40 MG tablet Take 1 tablet (40 mg total) by mouth daily.   No facility-administered medications prior to visit.    Review of Systems  Constitutional:  Positive for chills and fatigue. Negative for fever.  HENT:  Positive for congestion, sinus pressure, sinus pain and sore throat. Negative for nosebleeds,  postnasal drip and rhinorrhea.   Respiratory:  Positive for cough. Negative for shortness of breath and wheezing.   Gastrointestinal:  Negative for diarrhea, nausea and vomiting.  Musculoskeletal:  Negative for myalgias.        Objective    BP 132/78   Pulse 94   Temp 98.3 F (36.8 C) (Oral)   Resp 18   Ht 5' 9 (1.753 m)   Wt 193 lb 6.4 oz (87.7 kg)   SpO2 96%   BMI 28.56 kg/m     Physical Exam Vitals reviewed.  Constitutional:      General: She is awake.     Appearance: Normal appearance. She is well-developed and well-groomed. She is ill-appearing.  HENT:     Head: Normocephalic and atraumatic.     Mouth/Throat:     Lips: Pink.     Mouth: Mucous membranes are moist.     Pharynx: Uvula midline. No pharyngeal swelling, posterior oropharyngeal erythema or postnasal drip.  Cardiovascular:     Rate and Rhythm: Normal rate and regular rhythm.     Heart sounds: Normal heart sounds.  Pulmonary:     Effort: Pulmonary effort is normal.     Breath sounds: Normal breath sounds. No decreased air movement. No decreased breath sounds, wheezing, rhonchi or rales.  Musculoskeletal:     Cervical back: Normal range of motion.  Lymphadenopathy:     Head:     Right side of head: No submental, submandibular or preauricular adenopathy.     Left side of head: No submental, submandibular or preauricular adenopathy.     Cervical:     Right cervical: No superficial cervical adenopathy.    Left cervical: No superficial cervical adenopathy.     Upper Body:     Right upper body: No supraclavicular adenopathy.     Left upper body: No supraclavicular adenopathy.  Neurological:     Mental Status: She is alert.  Psychiatric:        Attention and Perception: Attention and perception normal.        Mood and Affect: Affect is flat.        Speech: Speech normal.        Behavior: Behavior normal. Behavior is cooperative.       No results found for any visits on 05/10/23.  Assessment &  Plan      No follow-ups on file.      Problem List Items Addressed This Visit   None Visit Diagnoses       Acute sinusitis, recurrence not specified, unspecified location    -  Primary   Relevant Medications   doxycycline  (VIBRA -TABS) 100 MG tablet     Vulvovaginal itching       Relevant Orders   Cervicovaginal ancillary only      Assessment and Plan    Sinusitis Presents with symptoms consistent with sinusitis, including nasal congestion, ear popping, and sinus pain/pressure since Monday. No history  of asthma or COPD. No recent travel or exposure to sick contacts. Symptomatic management with OTC medications ineffective. Known allergy to Augmentin and Cipro. - Prescribe doxycycline  - Send prescription to [facility] [place]  Possible Vaginal Yeast Infection Reports slight vaginal itching with a history of recurrent yeast infections, likely related to diabetes. Informed consent obtained for vaginal swab to check for yeast infection, bacterial vaginosis, and trichomonas. Discussed potential treatment with Diflucan  if yeast infection confirmed. - Perform vaginal swab for yeast infection, bacterial vaginosis, and trichomonas - results to dictate further management - Consider prescribing Diflucan  if yeast infection is confirmed  Diabetes Mellitus Diabetes mellitus contributing to recurrent yeast infections. Discussed importance of blood glucose control to prevent recurrent infections. - Monitor blood glucose levels - Provide education on managing diabetes to prevent recurrent infections.        No follow-ups on file.   I, Bethel Sirois E Winnifred Dufford, PA-C, have reviewed all documentation for this visit. The documentation on 05/11/23 for the exam, diagnosis, procedures, and orders are all accurate and complete.   [place], MHS, PA-C [facility]

## 2023-05-14 LAB — CERVICOVAGINAL ANCILLARY ONLY
Bacterial Vaginitis (gardnerella): NEGATIVE
Candida Glabrata: NEGATIVE
Candida Vaginitis: NEGATIVE
Comment: NEGATIVE
Comment: NEGATIVE
Comment: NEGATIVE
Comment: NEGATIVE
Trichomonas: NEGATIVE

## 2023-05-15 NOTE — Progress Notes (Signed)
 Your cervicovaginal swab was negative for trichomonas, BV, yeast infection.  No further intervention is indicated at this time

## 2023-05-28 LAB — HM DIABETES EYE EXAM

## 2023-06-12 ENCOUNTER — Ambulatory Visit: Payer: 59 | Admitting: Family Medicine

## 2023-06-14 ENCOUNTER — Ambulatory Visit: Payer: 59 | Admitting: Family Medicine

## 2023-06-14 ENCOUNTER — Encounter: Payer: Self-pay | Admitting: Family Medicine

## 2023-06-14 VITALS — BP 153/69 | HR 68 | Ht 69.0 in | Wt 199.8 lb

## 2023-06-14 DIAGNOSIS — Z1231 Encounter for screening mammogram for malignant neoplasm of breast: Secondary | ICD-10-CM | POA: Diagnosis not present

## 2023-06-14 DIAGNOSIS — R3989 Other symptoms and signs involving the genitourinary system: Secondary | ICD-10-CM | POA: Diagnosis not present

## 2023-06-14 DIAGNOSIS — R1900 Intra-abdominal and pelvic swelling, mass and lump, unspecified site: Secondary | ICD-10-CM | POA: Diagnosis not present

## 2023-06-14 DIAGNOSIS — L309 Dermatitis, unspecified: Secondary | ICD-10-CM

## 2023-06-14 DIAGNOSIS — E1169 Type 2 diabetes mellitus with other specified complication: Secondary | ICD-10-CM | POA: Diagnosis not present

## 2023-06-14 MED ORDER — SULFAMETHOXAZOLE-TRIMETHOPRIM 800-160 MG PO TABS: 1.0000 | ORAL_TABLET | Freq: Two times a day (BID) | ORAL | 0 refills | Status: AC

## 2023-06-14 MED ORDER — TRIAMCINOLONE ACETONIDE 0.5 % EX OINT: 1.0000 | TOPICAL_OINTMENT | Freq: Two times a day (BID) | CUTANEOUS | 0 refills | Status: DC

## 2023-06-14 NOTE — Progress Notes (Signed)
 Acute visit   Patient: Lisa [name]   DOB: 1960/07/07   63 y.o. Female  MRN: [id number]  Chief Complaint  Patient presents with   Urinary Tract Infection    Patient complains of abnormal smelling urine, burning with urination, frequency, incomplete bladder emptying, and abdominal pain and vaginal itching. She has had symptoms for 1 week. Patient also complains of back pain and fever. Patient denies congestion, cough, fever, rhinitis, sorethroat, and vaginal discharge. Patient does have a history of recurrent UTI.            Care Coordination    Mammogram - order placed Diabetic Kidney - urine collected Colonoscopy - Would like to wait   Subjective    Discussed the use of AI scribe software for clinical note transcription with the patient, who gave verbal consent to proceed.  History of Present Illness   The patient, with a history of recurrent UTIs and multiple antibiotic allergies, presents with symptoms suggestive of a UTI, including painful urination and bloating. The patient reports that the symptoms have been severe enough to prompt an earlier appointment than initially scheduled. The patient denies taking any over-the-counter medications that could alter the color of her urine, which has been fluctuating between clear and orange.  In addition to the UTI symptoms, the patient reports a growing lump on her back, which first appeared around September. The lump has been causing discomfort and stress for the patient. The patient also reports a recurrent rash, which she has been managing with Cetaphil, but with limited success.       Review of Systems  Objective    BP (!) 153/69 (BP Location: Right Arm, Patient Position: Sitting, Cuff Size: Normal)   Pulse 68   Ht 5' 9 (1.753 m)   Wt 199 lb 12.8 oz (90.6 kg)   SpO2 100%   BMI 29.51 kg/m  Physical Exam Vitals reviewed.  Constitutional:      General: She is not in acute distress.    Appearance: She is  well-developed.  HENT:     Head: Normocephalic and atraumatic.  Eyes:     General: No scleral icterus.    Conjunctiva/sclera: Conjunctivae normal.  Cardiovascular:     Rate and Rhythm: Normal rate and regular rhythm.  Pulmonary:     Effort: Pulmonary effort is normal. No respiratory distress.  Abdominal:     Tenderness: There is abdominal tenderness (suprapubic).  Musculoskeletal:     Comments: Soft, slightly mobile softball sized mass of R flank, no TTP  Skin:    General: Skin is warm and dry.     Findings: Rash present.  Neurological:     Mental Status: She is alert and oriented to person, place, and time.  Psychiatric:        Behavior: Behavior normal.        No results found for any visits on 06/14/23.  Assessment & Plan     Problem List Items Addressed This Visit       Endocrine   Diabetes mellitus, type 2 (HCC)   Other Visit Diagnoses       Suspected UTI    -  Primary   Relevant Orders   Urine Culture   Urine Microscopic     Breast cancer screening by mammogram       Relevant Orders   MM 3D SCREENING MAMMOGRAM BILATERAL BREAST     Right flank mass       Relevant Orders  US  CHEST SOFT TISSUE     Eczema, unspecified type              Urinary Tract Infection (UTI) Symptoms include dysuria, lower abdominal pain, and fluctuating urine color. UA Dip non-diagnostic due to orange color of urine (pt claims this is not due to AZO).  Previous culture showed E. Coli sensitive to all except Cipro and Levaquin, which she is allergic to. Bactrim  chosen due to lack of allergy. Discussed side effects and importance of completing antibiotics. Culture results will guide further treatment if needed. - Prescribe Bactrim  1 pill twice a day for 5 days - Send urine for culture and microscopic analysis - Send diabetic urine test  Eczema New onset itching and rash on arms, likely eczema. Current moisturizer is Cetaphil; recommend thicker emollient. Discussed triamcinolone   ointment for localized rash and regular moisturizing. - Recommend using a thick emollient like Vaseline after showering - Prescribe triamcinolone  ointment for localized rash - Send prescription to [facility]  Lipoma Growing mass in right mid-back since September, likely a benign lipoma. Previous CT scan normal, but this was prior to this appearing. Discussed non-cancerous nature and potential removal if bothersome. Ultrasound to confirm diagnosis and guide management. - Order ultrasound of the right flank soft tissue - Refer to surgeon for evaluation and potential removal if ultrasound confirms lipoma  General Health Maintenance Due for a mammogram. - Order mammogram - Instruct to call and schedule the appointment.        Meds ordered this encounter  Medications   sulfamethoxazole -trimethoprim  (BACTRIM  DS) 800-160 MG tablet    Sig: Take 1 tablet by mouth 2 (two) times daily for 5 days.    Dispense:  10 tablet    Refill:  0   triamcinolone  ointment (KENALOG ) 0.5 %    Sig: Apply 1 Application topically 2 (two) times daily.    Dispense:  30 g    Refill:  0     Return if symptoms worsen or fail to improve.      Jon Eva, MD  [facility] [phone] (phone) [phone] (fax)  [facility]

## 2023-06-14 NOTE — Patient Instructions (Signed)
 Call [facility] to schedule a mammogram [phone]

## 2023-06-17 LAB — URINE CULTURE

## 2023-06-17 LAB — SPECIMEN STATUS REPORT

## 2023-06-18 ENCOUNTER — Encounter: Payer: Self-pay | Admitting: Family Medicine

## 2023-06-25 ENCOUNTER — Ambulatory Visit
Admission: RE | Admit: 2023-06-25 | Discharge: 2023-06-25 | Disposition: A | Discharge status: Home / Self Care | Payer: 59 | Source: Ambulatory Visit | Attending: Family Medicine | Admitting: Family Medicine

## 2023-06-25 DIAGNOSIS — R1900 Intra-abdominal and pelvic swelling, mass and lump, unspecified site: Secondary | ICD-10-CM | POA: Diagnosis present

## 2023-06-26 ENCOUNTER — Encounter: Payer: Self-pay | Admitting: Family Medicine

## 2023-06-28 MED ORDER — CLOTRIMAZOLE 1 % VA CREA: 1.0000 | TOPICAL_CREAM | Freq: Every day | VAGINAL | 0 refills | Status: AC

## 2023-06-28 NOTE — Telephone Encounter (Signed)
Copied from CRM [id number]. Topic: Clinical - Medical Advice >> Jun 28, 2023  1:12 PM Gildardo Pounds wrote: Reason for CRM: Still has vaginal itching inside and out. Can you prescribe a vaginal cream, please? Callback number is [phone]

## 2023-07-02 ENCOUNTER — Encounter: Payer: Self-pay | Admitting: Family Medicine

## 2023-07-02 ENCOUNTER — Ambulatory Visit: Payer: Self-pay | Admitting: Family Medicine

## 2023-07-02 LAB — HEMOGLOBIN A1C: Hemoglobin A1C: 5.5

## 2023-07-02 NOTE — Telephone Encounter (Signed)
 Noted.

## 2023-07-02 NOTE — Telephone Encounter (Signed)
 This RN received warm tx for triage, patient not on line. This RN attempted to call back, patient did not answer. Voicemail left requesting return call for triage.   Copied from CRM [id number]. Topic: Clinical - Red Word Triage >> Jul 02, 2023  8:35 AM Geroge Baseman wrote: Red Word that prompted transfer to Nurse Triage: Vaginal itching still, been taking antibiotic for UTI.

## 2023-07-02 NOTE — Telephone Encounter (Signed)
 Reason for Disposition  [1] Symptoms of a yeast infection (i.e., itchy, white discharge, not bad smelling) AND [2] feels like prior vaginal yeast infections    Rx already called in for her  Answer Assessment - Initial Assessment Questions 1. SYMPTOM: "What's the main symptom you're concerned about?" (e.g., pain, itching, dryness)     I returned call.  Pt had called in earlier today regarding her rx for vaginal itching from taking antibiotics.   The rx was called in but pt was not made aware.   I'm on my way now to pick it up when I jusst called her. No triage needed.   Dr. Beryle Flock had already called it in but no one called and let pt know.   2. LOCATION: "Where is the   located?" (e.g., inside/outside, left/right)      3. ONSET: "When did the    start?"      4. PAIN: "Is there any pain?" If Yes, ask: "How bad is it?" (Scale: 1-10; mild, moderate, severe)   -  MILD (1-3): Doesn't interfere with normal activities.    -  MODERATE (4-7): Interferes with normal activities (e.g., work or school) or awakens from sleep.     -  SEVERE (8-10): Excruciating pain, unable to do any normal activities.      5. ITCHING: "Is there any itching?" If Yes, ask: "How bad is it?" (Scale: 1-10; mild, moderate, severe)      6. CAUSE: "What do you think is causing the discharge?" "Have you had the same problem before? What happened then?"      7. OTHER SYMPTOMS: "Do you have any other symptoms?" (e.g., fever, itching, vaginal bleeding, pain with urination, injury to genital area, vaginal foreign body)      8. PREGNANCY: "Is there any chance you are pregnant?" "When was your last menstrual period?"  Protocols used: Vaginal Symptoms-A-AH  Chief Complaint: Pt never heard back regarding the rx that Dr. Beryle Flock sent in for vaginal symptoms.   She's on her way now to pick it up.   Someone else called her back this morning  Symptoms: vaginal itching Frequency:  Pertinent Negatives: Patient denies  Disposition:  [] ED /[] Urgent Care (no appt availability in office) / [] Appointment(In office/virtual)/ []  [place] [facility]/ [x] Home Care/ [] Refused Recommended Disposition /[]  Mobile Bus/ []  Follow-up with PCP Additional Notes: Issue with rx resolved

## 2023-07-02 NOTE — Telephone Encounter (Signed)
 Pt hung up or was disconnected prior to transfer to nurse.  Attemped to call her back. Left a VM to call back.

## 2023-07-02 NOTE — Telephone Encounter (Signed)
 Pt aware cream has been sent in.  Nothing further needed.

## 2023-07-22 ENCOUNTER — Emergency Department
Admission: EM | Admit: 2023-07-22 | Discharge: 2023-07-22 | Disposition: A | Discharge status: Home / Self Care | Attending: Emergency Medicine | Admitting: Emergency Medicine

## 2023-07-22 ENCOUNTER — Other Ambulatory Visit: Payer: Self-pay

## 2023-07-22 DIAGNOSIS — I10 Essential (primary) hypertension: Secondary | ICD-10-CM | POA: Diagnosis not present

## 2023-07-22 DIAGNOSIS — E162 Hypoglycemia, unspecified: Secondary | ICD-10-CM

## 2023-07-22 DIAGNOSIS — N390 Urinary tract infection, site not specified: Secondary | ICD-10-CM | POA: Insufficient documentation

## 2023-07-22 DIAGNOSIS — E11649 Type 2 diabetes mellitus with hypoglycemia without coma: Secondary | ICD-10-CM | POA: Diagnosis not present

## 2023-07-22 LAB — URINALYSIS, ROUTINE W REFLEX MICROSCOPIC
Bilirubin Urine: NEGATIVE
Glucose, UA: >=500 mg/dL — AB
Ketones, ur: NEGATIVE mg/dL
Leukocytes,Ua: NEGATIVE
Nitrite: POSITIVE — AB
Protein, ur: NEGATIVE mg/dL
Specific Gravity, Urine: 1.013 (ref 1.005–1.030)
pH: 5 (ref 5.0–8.0)

## 2023-07-22 LAB — BASIC METABOLIC PANEL
Anion gap: 8 (ref 5–15)
BUN: 12 mg/dL (ref 8–23)
CO2: 27 mmol/L (ref 22–32)
Calcium: 8.3 mg/dL — ABNORMAL LOW (ref 8.9–10.3)
Chloride: 101 mmol/L (ref 98–111)
Creatinine, Ser: 0.6 mg/dL (ref 0.44–1.00)
GFR, Estimated: >60 mL/min (ref >60)
Glucose, Bld: 178 mg/dL — ABNORMAL HIGH (ref 70–99)
Potassium: 3.1 mmol/L — ABNORMAL LOW (ref 3.5–5.1)
Sodium: 136 mmol/L (ref 135–145)

## 2023-07-22 LAB — CBC
HCT: 39.1 % (ref 36.0–46.0)
Hemoglobin: 12.4 g/dL (ref 12.0–15.0)
MCH: 26.4 pg (ref 26.0–34.0)
MCHC: 31.7 g/dL (ref 30.0–36.0)
MCV: 83.2 fL (ref 80.0–100.0)
Platelets: 115 10*3/uL — ABNORMAL LOW (ref 150–400)
RBC: 4.7 MIL/uL (ref 3.87–5.11)
RDW: 17.7 % — ABNORMAL HIGH (ref 11.5–15.5)
WBC: 7.9 10*3/uL (ref 4.0–10.5)
nRBC: 0 % (ref 0.0–0.2)

## 2023-07-22 LAB — CBG MONITORING, ED
Glucose-Capillary: 195 mg/dL — ABNORMAL HIGH (ref 70–99)
Glucose-Capillary: 113 mg/dL — ABNORMAL HIGH (ref 70–99)

## 2023-07-22 MED ORDER — SODIUM CHLORIDE 0.9 % IV BOLUS
1000.0000 mL | Freq: Once | INTRAVENOUS | Status: AC
  Administered 2023-07-22: 1000 mL via INTRAVENOUS

## 2023-07-22 MED ORDER — CEPHALEXIN 500 MG PO CAPS: 500.0000 mg | ORAL_CAPSULE | Freq: Two times a day (BID) | ORAL | 0 refills | Status: AC

## 2023-07-22 MED ORDER — SODIUM CHLORIDE 0.9 % IV SOLN
1.0000 g | Freq: Once | INTRAVENOUS | Status: AC
  Administered 2023-07-22: 1 g via INTRAVENOUS
  Filled 2023-07-22: qty 10

## 2023-07-22 NOTE — ED Provider Notes (Signed)
 [facility] Provider Note    Event Date/Time   First MD Initiated Contact with Patient 07/22/23 1625     (approximate)   History   Tremors, Dysuria, and Hypoglycemia   HPI  Lisa [name] is a 63 y.o. female with a history of diabetes, hypertension, hyperlipidemia, diabetic neuropathy, GERD and cirrhosis who presents with hyperglycemia and altered mental status.  The daughter reports that the patient was sleepy and less responsive this morning.  EMS was called out and found her glucose to be in the 40s so she was given sweet tea.  However subsequently, she again became weak.  She states that she woke up around 7 AM and took her Lantus and Lyumjev around that time but then went back to bed and did not eat which is not typical for her.  She states that she was treated for UTI about a month ago.  However over the last several days she has developed urinary frequency and foul odor.  She also reports feeling dehydrated with a dry mouth and is lightheaded.  She denies any vomiting or diarrhea.  She has no cough or shortness of breath.  I reviewed the past medical records per the patient's most recent outpatient encounter was with endocrinology at [facility] on 2/24 for follow-up of her diabetes.  She was started on Lyumjev at that time and also takes Lantus.   Physical Exam   Triage Vital Signs: ED Triage Vitals  Encounter Vitals Group     BP 07/22/23 1403 123/80     Systolic BP Percentile --      Diastolic BP Percentile --      Pulse Rate 07/22/23 1403 61     Resp 07/22/23 1403 20     Temp 07/22/23 1403 97.8 F (36.6 C)     Temp Source 07/22/23 1403 Oral     SpO2 07/22/23 1403 99 %     Weight 07/22/23 1402 193 lb (87.5 kg)     Height 07/22/23 1402 5\' 9"  (1.753 m)     Head Circumference --      Peak Flow --      Pain Score 07/22/23 1358 0     Pain Loc --      Pain Education --      Exclude from Growth Chart --     Most recent vital signs: Vitals:    07/22/23 1730 07/22/23 1801  BP: (!) 169/78   Pulse: 63   Resp: 15   Temp:  98.4 F (36.9 C)  SpO2: 100%      General: Alert and oriented, no distress.  CV:  Good peripheral perfusion.  Resp:  Normal effort.  Lungs CTAB. Abd:  Soft and nontender.  No distention. Other:  Dry mucous membranes.   ED Results / Procedures / Treatments   Labs (all labs ordered are listed, but only abnormal results are displayed) Labs Reviewed  URINALYSIS, ROUTINE W REFLEX MICROSCOPIC - Abnormal; Notable for the following components:      Result Value   Color, Urine AMBER (*)    APPearance HAZY (*)    Glucose, UA >=500 (*)    Hgb urine dipstick MODERATE (*)    Nitrite POSITIVE (*)    Bacteria, UA MANY (*)    All other components within normal limits  BASIC METABOLIC PANEL - Abnormal; Notable for the following components:   Potassium 3.1 (*)    Glucose, Bld 178 (*)    Calcium 8.3 (*)  All other components within normal limits  CBC - Abnormal; Notable for the following components:   RDW 17.7 (*)    Platelets 115 (*)    All other components within normal limits  CBG MONITORING, ED - Abnormal; Notable for the following components:   Glucose-Capillary 195 (*)    All other components within normal limits  CBG MONITORING, ED - Abnormal; Notable for the following components:   Glucose-Capillary 113 (*)    All other components within normal limits     EKG    RADIOLOGY    PROCEDURES:  Critical Care performed: No  Procedures   MEDICATIONS ORDERED IN ED: Medications  sodium chloride 0.9 % bolus 1,000 mL (1,000 mLs Intravenous New Bag/Given 07/22/23 1758)  cefTRIAXone (ROCEPHIN) 1 g in sodium chloride 0.9 % 100 mL IVPB (0 g Intravenous Stopped 07/22/23 1829)     IMPRESSION / MDM / ASSESSMENT AND PLAN / ED COURSE  I reviewed the triage vital signs and the nursing notes.  63 year old female with PMH as noted presents with increased lethargy, an episode of hypoglycemia, and urinary  symptoms.  She took her Lantus and other medication this morning without eating.  Physical exam is unremarkable for acute findings.  The patient is alert and oriented.  Vital signs are normal.  Differential diagnosis includes, but is not limited to, hypoglycemia due to medication, UTI or other acute infection, electrolyte abnormality or other metabolic disturbance, dehydration.  Patient's presentation is most consistent with acute complicated illness / injury requiring diagnostic workup.  Urinalysis shows findings consistent with UTI.  I reviewed the urine cultures from last month which showed E. coli that were pansensitive except to Cipro and levofloxacin.  CBC shows no leukocytosis.  BMP shows a glucose of 178 with no anion gap.  Will give IV fluids, ceftriaxone, and reassess.  ----------------------------------------- 7:07 PM on 07/22/2023 -----------------------------------------  The patient is feeling well.  Her vital signs remain stable.  Glucose remains stable at 113.  She feels well to go home.  She is stable for discharge at this time.  I counseled her on the results of the workup and plan of care.  I have prescribed a slightly longer course of Keflex at 10 days to cover for her UTI.  I gave strict return precautions and she expressed understanding.  FINAL CLINICAL IMPRESSION(S) / ED DIAGNOSES   Final diagnoses:  Urinary tract infection without hematuria, site unspecified  Hypoglycemia     Rx / DC Orders   ED Discharge Orders          Ordered    cephALEXin (KEFLEX) 500 MG capsule  2 times daily        07/22/23 1906             Note:  This document was prepared using Dragon voice recognition software and may include unintentional dictation errors.    Dionne Bucy, MD 07/22/23 [place]

## 2023-07-22 NOTE — Discharge Instructions (Addendum)
 Take the antibiotic as prescribed and finish the full course.  Make sure not to take your morning insulin without eating shortly after.  Follow-up with your primary care provider.  Return to the ER for new, worsening, or persistent severe weakness or lightheadedness, lethargy, persistent low blood sugar readings, persistent or worsening urinary symptoms, abdominal pain, flank pain, fever, vomiting, or any other new or worsening symptoms that concern you.

## 2023-07-22 NOTE — ED Triage Notes (Signed)
 Pt to ED from home [facility] for tremors and feeling cold and having night sweats since 2 days ago.  This morning pt slept late and was lethargic and they called EMS and CBG was 45. Pt has shaking hands. CBG in triage is 195.  Pt took 80 units of Lantus and 8 units of another type insulin this morning and daughter unsure if having trouble eating. Also is taking a new medication for psoriasis.  Pt also endorsing urinary frequency and odor.

## 2023-07-22 NOTE — ED Notes (Signed)
 Pt given sandwich tray and beverage while pt waits for daughter to pick her up.

## 2023-08-09 ENCOUNTER — Encounter: Payer: Self-pay | Admitting: Family Medicine

## 2023-08-09 ENCOUNTER — Ambulatory Visit: Admitting: Family Medicine

## 2023-08-09 VITALS — BP 145/61 | HR 71 | Temp 98.0°F | Ht 69.0 in | Wt 198.0 lb

## 2023-08-09 DIAGNOSIS — R208 Other disturbances of skin sensation: Secondary | ICD-10-CM | POA: Diagnosis not present

## 2023-08-09 DIAGNOSIS — N3090 Cystitis, unspecified without hematuria: Secondary | ICD-10-CM

## 2023-08-09 DIAGNOSIS — R8290 Unspecified abnormal findings in urine: Secondary | ICD-10-CM | POA: Diagnosis not present

## 2023-08-09 DIAGNOSIS — N390 Urinary tract infection, site not specified: Secondary | ICD-10-CM

## 2023-08-09 DIAGNOSIS — E1169 Type 2 diabetes mellitus with other specified complication: Secondary | ICD-10-CM

## 2023-08-09 DIAGNOSIS — R82998 Other abnormal findings in urine: Secondary | ICD-10-CM

## 2023-08-09 LAB — POCT URINALYSIS DIPSTICK
Blood, UA: NEGATIVE
Glucose, UA: POSITIVE — AB
Ketones, UA: NEGATIVE
Nitrite, UA: POSITIVE
Protein, UA: POSITIVE — AB
Spec Grav, UA: 1.02 (ref 1.010–1.025)
Urobilinogen, UA: 2 U/dL — AB
pH, UA: 6 (ref 5.0–8.0)

## 2023-08-09 MED ORDER — TERCONAZOLE 0.4 % VA CREA: 1.0000 | TOPICAL_CREAM | Freq: Every day | VAGINAL | 0 refills | Status: DC

## 2023-08-09 MED ORDER — NITROFURANTOIN MONOHYD MACRO 100 MG PO CAPS: 100.0000 mg | ORAL_CAPSULE | Freq: Two times a day (BID) | ORAL | 0 refills | Status: AC

## 2023-08-09 NOTE — Progress Notes (Signed)
 Acute visit   Patient: Lisa [name]   DOB: Dec 03, 1960   63 y.o. Female  MRN: [id number] PCP: Erasmo Downer, MD   Chief Complaint  Patient presents with   Follow-up    UTI hospital follow up, pt was medicated for UTI but does not show signs of improvement Pt reports amonia smell, burinng while urinated   Subjective    Discussed the use of AI scribe software for clinical note transcription with the patient, who gave verbal consent to proceed.  History of Present Illness   The patient, with a history of recurrent urinary tract infections, presents with ongoing symptoms despite recent treatment with Keflex. She reports that her symptoms, including a strong ammonia smell, itching, and discomfort, have not improved. The patient also mentions a recent episode of hypoglycemia, during which she experienced extreme cold and disorientation. This episode was severe enough to warrant a visit from emergency medical services, who found the patient's blood sugar to be 45. The patient also discusses significant family stressors, including a recent conflict between her husband and niece, and concerns about her husband's drinking and behavior.        Review of Systems  Objective    BP (!) 145/61   Pulse 71   Temp 98 F (36.7 C)   Ht 5\' 9"  (1.753 m)   Wt 198 lb (89.8 kg)   SpO2 99%   BMI 29.24 kg/m  Physical Exam Vitals reviewed.  Constitutional:      General: She is not in acute distress.    Appearance: Normal appearance. She is well-developed. She is not diaphoretic.  HENT:     Head: Normocephalic and atraumatic.  Eyes:     General: No scleral icterus.    Conjunctiva/sclera: Conjunctivae normal.  Neck:     Thyroid: No thyromegaly.  Cardiovascular:     Rate and Rhythm: Normal rate and regular rhythm.     Heart sounds: Normal heart sounds. No murmur heard. Pulmonary:     Effort: Pulmonary effort is normal. No respiratory distress.     Breath sounds: Normal  breath sounds. No wheezing, rhonchi or rales.  Abdominal:     General: There is no distension.     Palpations: Abdomen is soft.     Tenderness: There is no abdominal tenderness. There is no right CVA tenderness, left CVA tenderness, guarding or rebound.  Musculoskeletal:     Cervical back: Neck supple.     Right lower leg: No edema.     Left lower leg: No edema.  Lymphadenopathy:     Cervical: No cervical adenopathy.  Skin:    General: Skin is warm and dry.     Findings: No rash.  Neurological:     Mental Status: She is alert and oriented to person, place, and time. Mental status is at baseline.  Psychiatric:        Mood and Affect: Affect is tearful.        Behavior: Behavior normal.       Results for orders placed or performed in visit on 08/09/23  Hemoglobin A1c  Result Value Ref Range   Hemoglobin A1C 5.5   POCT urinalysis dipstick  Result Value Ref Range   Color, UA amber    Clarity, UA clear    Glucose, UA Positive (A) Negative   Bilirubin, UA large    Ketones, UA N    Spec Grav, UA 1.020 1.010 - 1.025   Blood, UA N  pH, UA 6.0 5.0 - 8.0   Protein, UA Positive (A) Negative   Urobilinogen, UA 2.0 (A) 0.2 or 1.0 E.U./dL   Nitrite, UA POSITIVE    Leukocytes, UA Trace (A) Negative   Appearance     Odor      Assessment & Plan     Problem List Items Addressed This Visit       Endocrine   Diabetes mellitus, type 2 (HCC)   Relevant Orders   Urine microalbumin-creatinine with uACR   Other Visit Diagnoses       Cystitis    -  Primary   Relevant Orders   POCT urinalysis dipstick (Completed)   Urine Culture     Burning sensation       Relevant Orders   POCT urinalysis dipstick (Completed)     Foul smelling urine       Relevant Orders   POCT urinalysis dipstick (Completed)     Leukocytes in urine       Relevant Orders   Urine Culture     Recurrent UTI       Relevant Medications   nitrofurantoin, macrocrystal-monohydrate, (MACROBID) 100 MG capsule    Other Relevant Orders   Ambulatory referral to Urology           Recurrent Urinary Tract Infection (UTI) Recurrent UTIs with persistent symptoms, including ammonia odor and itching, not resolving after Keflex. Urinalysis shows infection with white blood cells and nitrites. Previous culture indicates sensitivity to all antibiotics except ciprofloxacin and Levaquin, which she is allergic to. Further investigation is needed. - Send urine culture to [facility] - Prescribe Macrobid 100 mg twice daily for 5 days - Refer to urology for evaluation of recurrent UTIs - Prescribe terconazole 0.4% cream for itching (often gets yeast infection with use of abx)  Hypoglycemia Hypoglycemic episode with blood glucose of 45, leading to altered consciousness, associated with UTI and possibly exacerbated by infection. Managed by EMTs with sugar administration and dietary advice.  Family and Social Stressors Significant family stress due to husband's alcohol use and infidelity, impacting her well-being. She feels safe but is advised to have support when confronting her husband. Husband is resistant to seeking help for alcohol use. - Encourage her to have support present when discussing issues with her husband  General Health Maintenance Due for physical examination. Follow-up for diabetes management scheduled with another provider. - Schedule physical examination in August       Meds ordered this encounter  Medications   terconazole (TERAZOL 7) 0.4 % vaginal cream    Sig: Place 1 applicator vaginally at bedtime.    Dispense:  45 g    Refill:  0   nitrofurantoin, macrocrystal-monohydrate, (MACROBID) 100 MG capsule    Sig: Take 1 capsule (100 mg total) by mouth 2 (two) times daily for 5 days.    Dispense:  10 capsule    Refill:  0     Return in about 4 months (around 12/09/2023) for CPE.      Shirlee Latch, MD  [facility] [phone] (phone) [phone]  (fax)  [facility]

## 2023-08-09 NOTE — Patient Instructions (Signed)

## 2023-08-10 LAB — MICROALBUMIN / CREATININE URINE RATIO
Creatinine, Urine: 103.4 mg/dL
Microalb/Creat Ratio: 89 mg/g{creat} — ABNORMAL HIGH (ref 0–29)
Microalbumin, Urine: 91.9 ug/mL

## 2023-08-10 LAB — SPECIMEN STATUS REPORT

## 2023-08-13 ENCOUNTER — Encounter: Payer: Self-pay | Admitting: Family Medicine

## 2023-08-13 LAB — SPECIMEN STATUS REPORT

## 2023-08-13 LAB — URINE CULTURE

## 2023-08-30 ENCOUNTER — Ambulatory Visit: Payer: Self-pay

## 2023-08-30 NOTE — Telephone Encounter (Signed)
 Noted.

## 2023-08-30 NOTE — Telephone Encounter (Signed)
 Chief Complaint: dizziness, foot numbness Symptoms: Dizzy, night sweats, foot tingling Frequency: dizzy & sweats each bedtime, foot tingling constant Pertinent Negatives: Patient denies fever, chest pain, difficulty breathing. Disposition: [] ED /[x] Urgent Care (no appt availability in office) / [] Appointment(In office/virtual)/ []  [place] [place] [facility]/ [] Home Care/ [] Refused Recommended Disposition /[] [facility]/ []  Follow-up with PCP Additional Notes:  Calling for night sweats for "awhile", for several days each time she goes to bed she will wake in cold sweat, feel dizzy, with mild shortness of breath, symptoms resolve after she gets up for the day. Denies chest pain or heart rate changes. She is also experiencing bilateral foot tingling/'numbness', left > right.  She has diabetes, she spoke with endocrine rn today, received insulin  adjustment and ordered new meter.  Her meter broke after this mornings blood glucose test. This morning her blood sugar is 86. Dizziniess and night sweats only when she goes to bed, she also experiences chills and shortness of breath. She experienced this in January as well that resolved. 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 Acute evaluation advised, none are available at practice until May, advised evaluation at urgent care today. Patient in agreement with plan. Educated on care advice as documented in protocol, patient verbalized understanding.    Copied from CRM [id number]. Topic: Clinical - Red Word Triage >> Aug 30, 2023 11:44 AM Baldemar Lev wrote: Red Word that prompted transfer to Nurse Triage: Numbness in feet, dizziness, night sweats. Reason for Disposition  [1] MODERATE dizziness (e.g., interferes with normal activities) AND [2] has NOT been evaluated by doctor (or NP/PA) for this  (Exception: Dizziness caused by heat exposure, sudden standing, or poor fluid intake.)  Protocols used:  Dizziness - Lightheadedness-A-AH

## 2023-09-03 ENCOUNTER — Ambulatory Visit: Admitting: Urology

## 2023-09-03 ENCOUNTER — Encounter: Payer: Self-pay | Admitting: Urology

## 2023-09-03 VITALS — BP 159/82 | HR 77 | Ht 69.0 in | Wt 193.0 lb

## 2023-09-03 DIAGNOSIS — N390 Urinary tract infection, site not specified: Secondary | ICD-10-CM | POA: Diagnosis not present

## 2023-09-03 DIAGNOSIS — R8271 Bacteriuria: Secondary | ICD-10-CM | POA: Diagnosis not present

## 2023-09-03 LAB — URINALYSIS, COMPLETE
Glucose, UA: NEGATIVE
Ketones, UA: NEGATIVE
Nitrite, UA: NEGATIVE
Specific Gravity, UA: 1.01 (ref 1.005–1.030)
Urobilinogen, Ur: 4 mg/dL — ABNORMAL HIGH (ref 0.2–1.0)
pH, UA: 6 (ref 5.0–7.5)

## 2023-09-03 LAB — MICROSCOPIC EXAMINATION

## 2023-09-03 LAB — BLADDER SCAN AMB NON-IMAGING: Scan Result: 12

## 2023-09-03 NOTE — Progress Notes (Signed)
 I, Maysun Jamey Mccallum, acting as a scribe for Geraline Knapp, MD., have documented all relevant documentation on the behalf of Geraline Knapp, MD, as directed by Geraline Knapp, MD while in the presence of Geraline Knapp, MD.  09/03/2023 4:04 PM   Griffith Led Gayheart 1960-12-05 [id number]  Referring provider: Mazie Speed, MD [address] [place],  [place] [address]  Chief Complaint  Patient presents with   Recurrent UTI    HPI: Nahlah Sivils is a 63 y.o. female referred for recurrent UTIs.   States she has episodes of hypoglycemia that are associated with chills and subjective fever. She states she has had mild dysuria on 1 occasion She states these symptoms do not resolve with antibiotics. She presently denies dysuria or voiding symptoms No previous history of urologic problems CT abdomen-pelvis performed January 2024 showed no urinary tract calculi or genitourinary abnormalities. She has had positive cultures for E. coli in February x2 and on 08/09/2022.    PMH: Past Medical History:  Diagnosis Date   Allergic rhinitis 02/15/2015   Arthritis    BP (high blood pressure) 02/15/2015   Cervical lymphadenopathy    Cirrhosis (HCC)    Diabetes mellitus    Elevated liver enzymes 09/23/2015   Elevated liver enzymes 09/23/2015   GERD (gastroesophageal reflux disease)    Gonalgia 02/15/2015   Hernia, lateral ventral 11/19/2012   Hypertension    Intra-abdominal and pelvic swelling, mass and lump, unspecified site 11/19/2012   Lymphadenopathy 03/18/2017    Surgical History: Past Surgical History:  Procedure Laterality Date   ABDOMINAL HYSTERECTOMY     2009   BREAST BIOPSY     CHOLECYSTECTOMY  1997   COLONOSCOPY WITH PROPOFOL  N/A 01/21/2018   Procedure: COLONOSCOPY WITH PROPOFOL ;  Surgeon: Luke Salaam, MD;  Location: [facility] ENDOSCOPY;  Service: Gastroenterology;  Laterality: N/A;   CORONARY ANGIOPLASTY     stints   CORONARY STENT INTERVENTION  N/A 09/13/2016   Procedure: Coronary Stent Intervention;  Surgeon: Antonette Batters, MD;  Location: [facility] INVASIVE CV LAB;  Service: Cardiovascular;  Laterality: N/A;   GALLBLADDER SURGERY     HERNIA REPAIR  11/19/2012   periumbilical with right ooph   JOINT REPLACEMENT     left knee   KNEE SURGERY  12/2011   total knee replacement Dr. Ephriam Hashimoto   LEFT HEART CATH AND CORONARY ANGIOGRAPHY N/A 09/13/2016   Procedure: Left Heart Cath and Coronary Angiography;  Surgeon: Antonette Batters, MD;  Location: [facility] INVASIVE CV LAB;  Service: Cardiovascular;  Laterality: N/A;   LEFT HEART CATH AND CORONARY ANGIOGRAPHY Left 08/11/2020   Procedure: LEFT HEART CATH AND CORONARY ANGIOGRAPHY;  Surgeon: Antonette Batters, MD;  Location: [facility] INVASIVE CV LAB;  Service: Cardiovascular;  Laterality: Left;   LYMPH GLAND EXCISION N/A 06/04/2017   Procedure: CERVICAL LYMPH NODE BIOPSY;  Surgeon: Franki Isles, MD;  Location: [facility] ORS;  Service: General;  Laterality: N/A;   TUBAL LIGATION      Home Medications:  Allergies as of 09/03/2023       Reactions   Amoxicillin-pot Clavulanate Swelling   lips   Ciprofloxacin Swelling   lips   Clarithromycin Swelling   lips        Medication List        Accurate as of September 03, 2023  4:04 PM. If you have any questions, ask your nurse or doctor.          Accu-Chek Softclix  Lancets lancets 1 each 3 (three) times daily.   aspirin  81 MG tablet Take 1 tablet by mouth daily.   Fifty50 Glucose Meter 2.0 w/Device Kit Use as directed.   insulin  glargine 100 UNIT/ML injection Commonly known as: LANTUS  Inject 0.7 mLs (70 Units total) into the skin daily.   lisinopril  20 MG tablet Commonly known as: ZESTRIL  Take by mouth.   Lyumjev KwikPen 100 UNIT/ML KwikPen Generic drug: Insulin  Lispro-aabc Inject 9 Units into the skin.   metoCLOPramide  10 MG tablet Commonly known as: REGLAN  Take 1 tablet (10 mg total) by mouth every 8 (eight) hours as needed for  nausea.   metoprolol  succinate 25 MG 24 hr tablet Commonly known as: TOPROL -XL Take 25 mg by mouth daily.   metoprolol  tartrate 25 MG tablet Commonly known as: LOPRESSOR  Take by mouth.   MULTIPLE VITAMINS-MINERALS PO Take 1 tablet by mouth daily.   omeprazole  40 MG capsule Commonly known as: PRILOSEC Take 1 capsule (40 mg total) by mouth daily.   pantoprazole  40 MG tablet Commonly known as: Protonix  Take 1 tablet (40 mg total) by mouth daily.   rosuvastatin 40 MG tablet Commonly known as: CRESTOR Take 40 mg by mouth daily.   terconazole  0.4 % vaginal cream Commonly known as: TERAZOL 7  Place 1 applicator vaginally at bedtime.   triamcinolone  ointment 0.5 % Commonly known as: KENALOG  Apply 1 Application topically 2 (two) times daily.   ursodiol  300 MG capsule Commonly known as: ACTIGALL  Take 300 mg by mouth daily.        Allergies:  Allergies  Allergen Reactions   Amoxicillin-Pot Clavulanate Swelling    lips   Ciprofloxacin Swelling    lips   Clarithromycin Swelling    lips    Family History: Family History  Problem Relation Age of Onset   Diabetes Sister    Diabetes Sister    Diabetes Sister    Diabetes Mother    Heart disease Father     Social History:  reports that she has never smoked. She has never used smokeless tobacco. She reports that she does not drink alcohol and does not use drugs.   Physical Exam: BP (!) 159/82   Pulse 77   Ht 5\' 9"  (1.753 m)   Wt 193 lb (87.5 kg)   BMI 28.50 kg/m   Constitutional:  Alert and oriented, No acute distress. HEENT: [place] AT Respiratory: Normal respiratory effort, no increased work of breathing. Psychiatric: Normal mood and affect.   Urinalysis Microscopy 6-10 WBC   Pertinent Imaging: CT was personally reviewed and interpreted.   CT  EXAM: CT ABDOMEN AND PELVIS WITH CONTRAST   TECHNIQUE: Multidetector CT imaging of the abdomen and pelvis was performed using the standard protocol following  bolus administration of intravenous contrast.   RADIATION DOSE REDUCTION: This exam was performed according to the departmental dose-optimization program which includes automated exposure control, adjustment of the mA and/or kV according to patient size and/or use of iterative reconstruction technique.   CONTRAST:  OMNIPAQUE  IOHEXOL  300 MG/ML  SOLN   COMPARISON:  March 03, 2017.   FINDINGS: Lower chest: No acute abnormality.   Hepatobiliary: No focal liver abnormality is seen. Status post cholecystectomy. No biliary dilatation.   Pancreas: Unremarkable. No pancreatic ductal dilatation or surrounding inflammatory changes.   Spleen: Normal in size without focal abnormality.   Adrenals/Urinary Tract: Adrenal glands are unremarkable. Kidneys are normal, without renal calculi, focal lesion, or hydronephrosis. Bladder is unremarkable.   Stomach/Bowel: The stomach is  unremarkable. The appendix appears normal. Mildly dilated small bowel loops are seen in the right side of the abdomen concerning for ileus or possibly small bowel obstruction. No colonic dilatation is noted.   Vascular/Lymphatic: Aortic atherosclerosis. No enlarged abdominal or pelvic lymph nodes.   Reproductive: Status post hysterectomy. No adnexal masses.   Other: No ascites is noted. There is continued presence of large fat containing ventral hernia in the left lower quadrant.   Musculoskeletal: No acute or significant osseous findings.   IMPRESSION: Mildly dilated small bowel loops are seen in the right side of the abdomen concerning for ileus or possibly distal small bowel obstruction.   Stable appearance of large fat containing ventral hernia in left lower quadrant.   Aortic Atherosclerosis (ICD10-I70.0).     Electronically Signed   By: Rosalene Colon M.D.   On: 05/30/2022 10:41   Assessment & Plan:    1. Bacteriuria She states her subjective fever and chills is always associated with  hypoglycemia, and we discussed that cystitis is typically not associated with hypoglycemia. PVR today 12 mL Urinalysis today with mild pyuria, and will repeat her urine culture. Will be notified with results and further recommendations.  I have reviewed the above documentation for accuracy and completeness, and I agree with the above.   Geraline Knapp, MD  [facility] [name] [place], [place] [address] [phone]

## 2023-09-06 LAB — CULTURE, URINE COMPREHENSIVE

## 2023-09-07 ENCOUNTER — Other Ambulatory Visit: Payer: Self-pay | Admitting: *Deleted

## 2023-09-07 MED ORDER — SULFAMETHOXAZOLE-TRIMETHOPRIM 800-160 MG PO TABS: 1.0000 | ORAL_TABLET | Freq: Two times a day (BID) | ORAL | 0 refills | Status: DC

## 2023-09-18 ENCOUNTER — Other Ambulatory Visit: Payer: Self-pay | Admitting: Urology

## 2023-09-18 MED ORDER — NITROFURANTOIN MACROCRYSTAL 50 MG PO CAPS: 50.0000 mg | ORAL_CAPSULE | Freq: Every day | ORAL | 0 refills | Status: DC

## 2023-09-18 NOTE — Progress Notes (Signed)
 Please let patient know I sent in Rx of a low-dose antibiotic to take daily for 1 month to prevent recurrent infection.  Was sent to [facility].  Keep scheduled follow-up with Sam next month    Notified patient as instructed, Left message on voice mail per DPR.

## 2023-09-24 ENCOUNTER — Telehealth: Payer: Self-pay | Admitting: Family Medicine

## 2023-09-24 ENCOUNTER — Other Ambulatory Visit: Payer: Self-pay

## 2023-09-24 MED ORDER — OMEPRAZOLE 40 MG PO CPDR: 40.0000 mg | DELAYED_RELEASE_CAPSULE | Freq: Every day | ORAL | 3 refills | Status: AC

## 2023-09-24 NOTE — Telephone Encounter (Signed)
 [facility] is requesting refill omeprazole (PRILOSEC) 40 MG capsule  Please advise

## 2023-09-24 NOTE — Telephone Encounter (Signed)
Converted to refill request 

## 2023-10-08 ENCOUNTER — Ambulatory Visit (INDEPENDENT_AMBULATORY_CARE_PROVIDER_SITE_OTHER): Admitting: Physician Assistant

## 2023-10-08 VITALS — BP 133/81 | HR 87 | Ht 69.0 in | Wt 195.0 lb

## 2023-10-08 DIAGNOSIS — R109 Unspecified abdominal pain: Secondary | ICD-10-CM

## 2023-10-08 DIAGNOSIS — R8271 Bacteriuria: Secondary | ICD-10-CM | POA: Diagnosis not present

## 2023-10-08 LAB — URINALYSIS, COMPLETE
Bilirubin, UA: POSITIVE — AB
Glucose, UA: NEGATIVE
Ketones, UA: NEGATIVE
Nitrite, UA: NEGATIVE
RBC, UA: NEGATIVE
Specific Gravity, UA: 1.01 (ref 1.005–1.030)
Urobilinogen, Ur: 4 mg/dL — ABNORMAL HIGH (ref 0.2–1.0)
pH, UA: 7 (ref 5.0–7.5)

## 2023-10-08 LAB — MICROSCOPIC EXAMINATION

## 2023-10-08 MED ORDER — NITROFURANTOIN MACROCRYSTAL 50 MG PO CAPS: 50.0000 mg | ORAL_CAPSULE | Freq: Every day | ORAL | 0 refills | Status: DC

## 2023-10-08 NOTE — Progress Notes (Signed)
 10/08/2023 3:02 PM   Lisa [name] January 09, 1961 [id number]  CC: Chief Complaint  Patient presents with   bacteriuria    HPI: Lisa [name] is a 63 y.o. female with PMH cirrhosis, diabetes, and bacteriuria who presents today for symptom recheck on Macrodantin  50 mg daily.   Today she reports no change in her symptoms since her last office visit.  She describes bilateral back pain and urine that smells like ammonia and is dark in color.  She denies dysuria.  In-office UA today positive for trace protein, 2+ bilirubin, 4.0 EU/DL urobilinogen, and trace leukocytes; urine microscopy with 6-10 WBCs/HPF and many bacteria.   PMH: Past Medical History:  Diagnosis Date   Allergic rhinitis 02/15/2015   Arthritis    BP (high blood pressure) 02/15/2015   Cervical lymphadenopathy    Cirrhosis (HCC)    Diabetes mellitus    Elevated liver enzymes 09/23/2015   Elevated liver enzymes 09/23/2015   GERD (gastroesophageal reflux disease)    Gonalgia 02/15/2015   Hernia, lateral ventral 11/19/2012   Hypertension    Intra-abdominal and pelvic swelling, mass and lump, unspecified site 11/19/2012   Lymphadenopathy 03/18/2017    Surgical History: Past Surgical History:  Procedure Laterality Date   ABDOMINAL HYSTERECTOMY     2009   BREAST BIOPSY     CHOLECYSTECTOMY  1997   COLONOSCOPY WITH PROPOFOL  N/A 01/21/2018   Procedure: COLONOSCOPY WITH PROPOFOL ;  Surgeon: Luke Salaam, MD;  Location: [facility] ENDOSCOPY;  Service: Gastroenterology;  Laterality: N/A;   CORONARY ANGIOPLASTY     stints   CORONARY STENT INTERVENTION N/A 09/13/2016   Procedure: Coronary Stent Intervention;  Surgeon: Antonette Batters, MD;  Location: [facility] INVASIVE CV LAB;  Service: Cardiovascular;  Laterality: N/A;   GALLBLADDER SURGERY     HERNIA REPAIR  11/19/2012   periumbilical with right ooph   JOINT REPLACEMENT     left knee   KNEE SURGERY  12/2011   total knee replacement Dr. Ephriam Hashimoto   LEFT HEART CATH AND  CORONARY ANGIOGRAPHY N/A 09/13/2016   Procedure: Left Heart Cath and Coronary Angiography;  Surgeon: Antonette Batters, MD;  Location: [facility] INVASIVE CV LAB;  Service: Cardiovascular;  Laterality: N/A;   LEFT HEART CATH AND CORONARY ANGIOGRAPHY Left 08/11/2020   Procedure: LEFT HEART CATH AND CORONARY ANGIOGRAPHY;  Surgeon: Antonette Batters, MD;  Location: [facility] INVASIVE CV LAB;  Service: Cardiovascular;  Laterality: Left;   LYMPH GLAND EXCISION N/A 06/04/2017   Procedure: CERVICAL LYMPH NODE BIOPSY;  Surgeon: Franki Isles, MD;  Location: [facility] ORS;  Service: General;  Laterality: N/A;   TUBAL LIGATION      Home Medications:  Allergies as of 10/08/2023       Reactions   Amoxicillin-pot Clavulanate Swelling   lips   Ciprofloxacin Swelling   lips   Clarithromycin Swelling   lips        Medication List        Accurate as of October 08, 2023  3:02 PM. If you have any questions, ask your nurse or doctor.          Accu-Chek Softclix Lancets lancets 1 each 3 (three) times daily.   aspirin  81 MG tablet Take 1 tablet by mouth daily.   Fifty50 Glucose Meter 2.0 w/Device Kit Use as directed.   insulin  glargine 100 UNIT/ML injection Commonly known as: LANTUS  Inject 0.7 mLs (70 Units total) into the skin daily.   lisinopril  20 MG tablet Commonly known as: ZESTRIL  Take  by mouth.   Lyumjev KwikPen 100 UNIT/ML KwikPen Generic drug: Insulin  Lispro-aabc Inject 9 Units into the skin.   metoCLOPramide  10 MG tablet Commonly known as: REGLAN  Take 1 tablet (10 mg total) by mouth every 8 (eight) hours as needed for nausea.   metoprolol  succinate 25 MG 24 hr tablet Commonly known as: TOPROL -XL Take 25 mg by mouth daily.   metoprolol  tartrate 25 MG tablet Commonly known as: LOPRESSOR  Take by mouth.   MULTIPLE VITAMINS-MINERALS PO Take 1 tablet by mouth daily.   nitrofurantoin  50 MG capsule Commonly known as: MACRODANTIN  Take 1 capsule (50 mg total) by mouth daily.    omeprazole  40 MG capsule Commonly known as: PRILOSEC Take 1 capsule (40 mg total) by mouth daily.   pantoprazole  40 MG tablet Commonly known as: Protonix  Take 1 tablet (40 mg total) by mouth daily.   rosuvastatin 40 MG tablet Commonly known as: CRESTOR Take 40 mg by mouth daily.   terconazole  0.4 % vaginal cream Commonly known as: TERAZOL 7  Place 1 applicator vaginally at bedtime.   triamcinolone  ointment 0.5 % Commonly known as: KENALOG  Apply 1 Application topically 2 (two) times daily.   ursodiol  300 MG capsule Commonly known as: ACTIGALL  Take 300 mg by mouth daily.        Allergies:  Allergies  Allergen Reactions   Amoxicillin-Pot Clavulanate Swelling    lips   Ciprofloxacin Swelling    lips   Clarithromycin Swelling    lips    Family History: Family History  Problem Relation Age of Onset   Diabetes Sister    Diabetes Sister    Diabetes Sister    Diabetes Mother    Heart disease Father     Social History:   reports that she has never smoked. She has never used smokeless tobacco. She reports that she does not drink alcohol and does not use drugs.  Physical Exam: BP 133/81   Pulse 87   Ht 5\' 9"  (1.753 m)   Wt 195 lb (88.5 kg)   BMI 28.80 kg/m   Constitutional:  Alert and oriented, no acute distress, nontoxic appearing HEENT: , AT Cardiovascular: No clubbing, cyanosis, or edema Respiratory: Normal respiratory effort, no increased work of breathing Skin: No rashes, bruises or suspicious lesions Neurologic: Grossly intact, no focal deficits, moving all 4 extremities Psychiatric: Normal mood and affect  Laboratory Data: Results for orders placed or performed in visit on 10/08/23  Microscopic Examination   Collection Time: 10/08/23  2:19 PM   Urine  Result Value Ref Range   WBC, UA 6-10 (A) 0 - 5 /hpf   RBC, Urine 0-2 0 - 2 /hpf   Epithelial Cells (non renal) 0-10 0 - 10 /hpf   Mucus, UA Present (A) Not Estab.   Bacteria, UA Many (A) None  seen/Few  Urinalysis, Complete   Collection Time: 10/08/23  2:19 PM  Result Value Ref Range   Specific Gravity, UA 1.010 1.005 - 1.030   pH, UA 7.0 5.0 - 7.5   Color, UA Yellow Yellow   Appearance Ur Cloudy (A) Clear   Leukocytes,UA Trace (A) Negative   Protein,UA Trace Negative/Trace   Glucose, UA Negative Negative   Ketones, UA Negative Negative   RBC, UA Negative Negative   Bilirubin, UA Positive (A) Negative   Urobilinogen, Ur 4.0 (H) 0.2 - 1.0 mg/dL   Nitrite, UA Negative Negative   Microscopic Examination See below:    Assessment & Plan:   1. Bacteriuria (Primary) UA with  persistent bacteriuria, will continue daily suppressive Macrodantin  and send for culture for further evaluation.  Question possible sample contamination.  She denies irritative voiding symptoms.  We discussed that dark and malodorous urine are likely secondary to her liver disease. - Urinalysis, Complete - CULTURE, URINE COMPREHENSIVE - nitrofurantoin  (MACRODANTIN ) 50 MG capsule; Take 1 capsule (50 mg total) by mouth daily.  Dispense: 30 capsule; Refill: 0  2. Flank pain Differential for bilateral back pain includes hydronephrosis versus musculoskeletal back pain.  Will obtain a renal ultrasound.  We discussed if renal ultrasound is negative for hydronephrosis, that her pain is more likely MSK in origin. - US  RENAL; Future  Return for Will call with results.  Kathreen Pare, PA-C  [facility] [address] [place], [place] [address] [phone]

## 2023-10-10 ENCOUNTER — Ambulatory Visit: Payer: Self-pay | Admitting: Urology

## 2023-10-10 DIAGNOSIS — R8271 Bacteriuria: Secondary | ICD-10-CM

## 2023-10-10 LAB — CULTURE, URINE COMPREHENSIVE

## 2023-10-10 MED ORDER — NITROFURANTOIN MONOHYD MACRO 100 MG PO CAPS
100.0000 mg | ORAL_CAPSULE | Freq: Two times a day (BID) | ORAL | 0 refills | Status: DC
Start: 2023-10-10 — End: 2023-10-10

## 2023-10-10 MED ORDER — NITROFURANTOIN MONOHYD MACRO 100 MG PO CAPS: 100.0000 mg | ORAL_CAPSULE | Freq: Two times a day (BID) | ORAL | 0 refills | Status: DC

## 2023-10-22 ENCOUNTER — Ambulatory Visit
Admission: RE | Admit: 2023-10-22 | Discharge: 2023-10-22 | Disposition: A | Discharge status: Home / Self Care | Source: Ambulatory Visit | Attending: Physician Assistant | Admitting: Physician Assistant

## 2023-10-22 DIAGNOSIS — R109 Unspecified abdominal pain: Secondary | ICD-10-CM | POA: Insufficient documentation

## 2023-12-10 ENCOUNTER — Encounter: Admitting: Family Medicine

## 2024-01-17 ENCOUNTER — Ambulatory Visit: Admitting: Family Medicine

## 2024-01-17 VITALS — BP 145/75 | HR 82 | Ht 69.0 in | Wt 194.0 lb

## 2024-01-17 DIAGNOSIS — R3989 Other symptoms and signs involving the genitourinary system: Secondary | ICD-10-CM

## 2024-01-17 DIAGNOSIS — R21 Rash and other nonspecific skin eruption: Secondary | ICD-10-CM | POA: Diagnosis not present

## 2024-01-17 DIAGNOSIS — N760 Acute vaginitis: Secondary | ICD-10-CM | POA: Diagnosis not present

## 2024-01-17 MED ORDER — TERCONAZOLE 0.4 % VA CREA: 1.0000 | TOPICAL_CREAM | Freq: Every day | VAGINAL | 0 refills | Status: DC

## 2024-01-17 MED ORDER — CLOBETASOL PROPIONATE 0.05 % EX OINT: 1.0000 | TOPICAL_OINTMENT | Freq: Two times a day (BID) | CUTANEOUS | 2 refills | Status: DC

## 2024-01-17 NOTE — Progress Notes (Signed)
 Acute visit   Patient: Lisa [name]   DOB: 10/01/1960   63 y.o. Female  MRN: [id number] PCP: Myrla Jon HERO, MD   Chief Complaint  Patient presents with   Acute Visit   Urinary Tract Infection    Patient reports UTI has been constant. No changes in symptoms. She was last treated the 1st or 2nd week in July when she went to urology and was placed on 30 day abx.   Rash    Small black areas appearing through body since July. Associated with pain and would like to know what they are. She reports some of them grow in size and they were first noticed on right knee and has now progressed to other areas. Eczema has been more irritated as well.    Subjective    Discussed the use of AI scribe software for clinical note transcription with the patient, who gave verbal consent to proceed.  History of Present Illness   Lisa [name] is a 63 year old female with diabetes who presents with a persistent rash and recurrent urinary tract infections.  The rash began in July and is characterized by small black nodules that sometimes enlarge and become painful. These nodules are present on her skin, including her face and back, but not on her palms or soles. They occasionally itch, especially when warm, and may release a small amount of infection when popped.  She experiences recurrent urinary tract infections. In June, she was prescribed a 30-day course of Macrobid  (nitrofurantoin ) as suppressive therapy, which improved urine odor and clarity. However, symptoms returned after stopping the medication. She experiences itching inside but denies burning during urination.  She has diabetes and is cautious about infections when attempting to pop the nodules on her skin.        Review of Systems  Objective    BP (!) 145/75 (BP Location: Left Arm, Patient Position: Sitting, Cuff Size: Normal)   Pulse 82   Ht 5' 9 (1.753 m)   Wt 194 lb (88 kg)   SpO2 99%   BMI 28.65 kg/m   Physical Exam Vitals reviewed.  Constitutional:      General: She is not in acute distress.    Appearance: She is well-developed.  HENT:     Head: Normocephalic and atraumatic.  Eyes:     General: No scleral icterus.    Conjunctiva/sclera: Conjunctivae normal.  Cardiovascular:     Rate and Rhythm: Normal rate and regular rhythm.  Pulmonary:     Effort: Pulmonary effort is normal. No respiratory distress.  Abdominal:     General: There is no distension.     Palpations: Abdomen is soft.     Tenderness: There is no abdominal tenderness.  Skin:    General: Skin is warm and dry.     Findings: Rash present.  Neurological:     Mental Status: She is alert and oriented to person, place, and time.  Psychiatric:        Behavior: Behavior normal.       Attempted UA - but dipstick inaccurate due to AZO use.   No results found for any visits on 01/17/24.  Assessment & Plan     Problem List Items Addressed This Visit   None Visit Diagnoses       Suspected UTI    -  Primary   Relevant Orders   POCT Urinalysis Dipstick   Urine Culture   Urinalysis, microscopic only  Rash               Recurrent urinary tract infection Recurrent urinary tract infection with previous E. coli infection sensitive to Macrobid . Previous treatment with a lower dose of Macrobid  for suppression was ineffective. Symptoms include fishy smell and itching without burning. Previous higher dose treatment was not completed, which may have contributed to persistent symptoms. - Send urine culture and microscopy to reassess current infection status - Consider starting antibiotic treatment based on culture results - Prescribe yeast cream for vaginal itching  Vulvovaginal candidiasis Recurrent vulvovaginal candidiasis with itching. - Prescribe terconazole  cream with applicator for vaginal use  Prurigo nodularis, suspected Chronic rash since July, characterized by flesh-colored nodules that are itchy and  painful when large. Differential diagnosis included liver-related issues, but prurigo nodularis was identified as the likely cause. Rash is exacerbated by scratching, possibly related to underlying conditions like liver disease or PVC. - Prescribe clobetasol  cream for body application twice daily on affected areas - Advise use of over-the-counter hydrocortisone for facial application - Consider dermatology referral if no improvement - Document rash with photographs for future reference  General Health Maintenance Discussion of scheduling bone density test and mammogram. Bone density test not yet scheduled due to lack of order. - Schedule bone density test to coincide with mammogram         Meds ordered this encounter  Medications   terconazole  (TERAZOL 7 ) 0.4 % vaginal cream    Sig: Place 1 applicator vaginally at bedtime.    Dispense:  45 g    Refill:  0   clobetasol  ointment (TEMOVATE ) 0.05 %    Sig: Apply 1 Application topically 2 (two) times daily.    Dispense:  60 g    Refill:  2     Return in about 2 months (around 03/18/2024) for CPE.      Jon Eva, MD  [facility] [phone] (phone) [phone] (fax)  [facility]

## 2024-01-18 LAB — URINALYSIS, MICROSCOPIC ONLY
Casts: NONE SEEN /LPF
Epithelial Cells (non renal): >10 /HPF — AB (ref 0–10)
RBC, Urine: NONE SEEN /HPF (ref 0–2)

## 2024-01-18 LAB — SPECIMEN STATUS REPORT

## 2024-01-21 ENCOUNTER — Ambulatory Visit: Payer: Self-pay | Admitting: Family Medicine

## 2024-01-21 LAB — URINE CULTURE

## 2024-01-21 LAB — SPECIMEN STATUS REPORT

## 2024-01-23 ENCOUNTER — Telehealth: Payer: Self-pay

## 2024-01-23 NOTE — Telephone Encounter (Signed)
 Copied from CRM [id number]. Topic: Clinical - Prescription Issue >> Jan 23, 2024 11:39 AM Everette C wrote: Reason for CRM: The patient has been directed by their pharmacy to contact their PCP regarding the status of their prescriptions for terconazole  (TERAZOL 7 ) 0.4 % vaginal cream [[id number]] and clobetasol  ointment (TEMOVATE ) 0.05 % [[id number]] about the status of their prescriptions and their availability at the pharmacy  Please contact further when possible

## 2024-01-24 ENCOUNTER — Other Ambulatory Visit: Payer: Self-pay

## 2024-01-24 MED ORDER — CEPHALEXIN 500 MG PO CAPS: 500.0000 mg | ORAL_CAPSULE | Freq: Three times a day (TID) | ORAL | 0 refills | Status: DC

## 2024-01-24 NOTE — Telephone Encounter (Signed)
 Perfectly reasonable to double check. She is ok to take Keflex . Please go ahead and send it in as directed from urine culture results. Those creams mentioned were sent in on 9/11

## 2024-01-24 NOTE — Addendum Note (Signed)
 Addended by: KATHI SQUIRES D on: 01/24/2024 12:00 PM   Modules accepted: Orders

## 2024-01-29 ENCOUNTER — Other Ambulatory Visit: Payer: Self-pay

## 2024-01-29 ENCOUNTER — Telehealth: Payer: Self-pay

## 2024-01-29 MED ORDER — CEPHALEXIN 500 MG PO CAPS: 500.0000 mg | ORAL_CAPSULE | Freq: Three times a day (TID) | ORAL | 0 refills | Status: DC

## 2024-01-29 NOTE — Telephone Encounter (Signed)
 Copied from CRM #[id number]. Topic: Clinical - Prescription Issue >> Jan 29, 2024 12:12 PM Ivette P wrote: Reason for CRM: PT calling in because prescription is being sent to wrong pharmacy.   cephALEXin  (KEFLEX ) 500 MG capsule  Pt needs medication to go to [facility] Address: [address] [place], [place], [place] [address] Phone: [phone]   Pls follow up with pt.

## 2024-01-29 NOTE — Telephone Encounter (Signed)
 Done

## 2024-03-24 ENCOUNTER — Encounter: Admitting: Family Medicine

## 2024-04-22 ENCOUNTER — Ambulatory Visit: Payer: Self-pay | Admitting: *Deleted

## 2024-04-22 NOTE — Telephone Encounter (Signed)
 Spoke with pt and advise. Pt will cb tmrw morning to schedule. Pt verbalized understanding

## 2024-04-22 NOTE — Telephone Encounter (Signed)
 FYI Only or Action Required?: FYI only for provider: recommended UC due to no available appt with PCP until Jan. Patient only wants to see PCP.Lisa [name]  Patient was last seen in primary care on 01/17/2024 by Myrla Jon HERO, MD.  Called Nurse Triage reporting Dysuria.  Symptoms began reports on going since Sept..  Interventions attempted: Rest, hydration, or home remedies.  Symptoms are: gradually worsening.  Triage Disposition: See HCP Within 4 Hours (Or PCP Triage)  Patient/caregiver understands and will follow disposition?: No, wishes to speak with PCP  See below NT encounter and declined UC wants to see PCP only.     Copied from CRM [id number]. Topic: Clinical - Red Word Triage >> Apr 22, 2024 11:04 AM Emylou G wrote: [facility] that prompted transfer to Nurse Triage: burning urine and itching, back pain - uti? >> Apr 22, 2024 11:07 AM Emylou G wrote: Needed to add dark urine Reason for Disposition  Side (flank) or lower back pain present  Answer Assessment - Initial Assessment Questions Recommended OV and patient only wants to see PCP due to long hx with UTIs. No available appt until Jan. Recommended UC. Patient does not want to go to UC. Please advise . If PCP will prescribe any medication for sx please send to [employer]. 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          1. SEVERITY: How bad is the pain?  (e.g., Scale 1-10; mild, moderate, or severe)     Worsening pain burning, itching  2. FREQUENCY: How many times have you had painful urination today?      Once in while  3. PATTERN: Is pain present every time you urinate or just sometimes?      Once in a while  4. ONSET: When did the painful urination start?      Has been on going since Sept.  5. FEVER: Do you have a fever? If Yes, ask: What is your temperature, how was it measured, and when did it start?    Reports she always has elevated temp but did not  report temp today  6. PAST UTI: Have you had a urine infection before? If Yes, ask: When was the last time? and What happened that time?      Yes last UTi last time 01/17/24 7. CAUSE: What do you think is causing the painful urination?  (e.g., UTI, scratch, Herpes sore)     UTI 8. OTHER SYMPTOMS: Do you have any other symptoms? (e.g., blood in urine, flank pain, genital sores, urgency, vaginal discharge)     Back pain, dark orange colored urine. Burning with urination , vaginal itching, strong odor to urine.  9. PREGNANCY: Is there any chance you are pregnant? When was your last menstrual period?     na  Protocols used: Urination Pain - Female-A-AH

## 2024-05-07 ENCOUNTER — Ambulatory Visit: Payer: Self-pay

## 2024-05-07 NOTE — Telephone Encounter (Signed)
 FYI Only or Action Required?: FYI only for provider: UC advised. Patient states she will go today, but requested appt be made with her PCP-scheduled for 06/02/24.   Patient was last seen in primary care on 01/17/2024 by Myrla Jon HERO, MD.  Called Nurse Triage reporting Back Pain and Dysuria.  Symptoms began several months ago. Patient first mentions symptoms from around October.   Interventions attempted: Rest, hydration, or home remedies.  Symptoms are: gradually worsening.  Triage Disposition: See HCP Within 4 Hours (Or PCP Triage)  Patient/caregiver understands and will follow disposition?: Yes  Reason for Disposition  Side (flank) or lower back pain present  Answer Assessment - Initial Assessment Questions Patient states that she recently had a UTI in August/September and was on a 30 day course of antibiotics. She states that about 2 weeks after finishing this course she started to notice cloudy and odorous urine. She states that she has called a few times, but has not been able to be seen. She would prefer to see her doctor only. Today she states that she is having back pain with burning and itching. No appts available with PCP, UC advised. Patient states that she will go to UC today, but would still like to schedule soonest appt available with PCP to follow up. Appt scheduled for 06/02/24.   1. SYMPTOM: What's the main symptom you're concerned about? (e.g., frequency, incontinence)     Dysuria, back pain  2. ONSET: When did the  symptoms  start?     Patient mentions October  3. PAIN: Is there any pain? If Yes, ask: How bad is it? (Scale: 1-10; mild, moderate, severe)     Yes, moderate  4. CAUSE: What do you think is causing the symptoms?     UTI  5. OTHER SYMPTOMS: Do you have any other symptoms? (e.g., blood in urine, fever, flank pain, pain with urination)     Burning and itching  6. PREGNANCY: Is there any chance you are pregnant? When was your last  menstrual period?     NA  Protocols used: Urinary Symptoms-A-AH  Copied from CRM #[id number]. Topic: Clinical - Red Word Triage >> May 07, 2024 10:58 AM Lisa [name] wrote: Red Word that prompted transfer to Nurse Triage: UTI continue.. Back pain, itching, little burning.

## 2024-05-16 ENCOUNTER — Ambulatory Visit: Admitting: Family Medicine

## 2024-05-22 ENCOUNTER — Ambulatory Visit

## 2024-05-22 DIAGNOSIS — L209 Atopic dermatitis, unspecified: Secondary | ICD-10-CM

## 2024-05-22 DIAGNOSIS — L299 Pruritus, unspecified: Secondary | ICD-10-CM | POA: Diagnosis not present

## 2024-05-22 DIAGNOSIS — L281 Prurigo nodularis: Secondary | ICD-10-CM | POA: Diagnosis not present

## 2024-05-22 DIAGNOSIS — K743 Primary biliary cirrhosis: Secondary | ICD-10-CM

## 2024-05-22 MED ORDER — CLOBETASOL PROPIONATE 0.05 % EX OINT: TOPICAL_OINTMENT | CUTANEOUS | 5 refills | Status: AC

## 2024-05-22 MED ORDER — DUPIXENT 300 MG/2ML ~~LOC~~ SOAJ: 300.0000 mg | SUBCUTANEOUS | 10 refills | Status: AC

## 2024-05-22 MED ORDER — TRIAMCINOLONE ACETONIDE 0.1 % EX OINT: TOPICAL_OINTMENT | CUTANEOUS | 2 refills | Status: AC

## 2024-05-22 MED ORDER — TACROLIMUS 0.1 % EX OINT: TOPICAL_OINTMENT | CUTANEOUS | 5 refills | Status: AC

## 2024-05-22 MED ORDER — DUPIXENT 300 MG/2ML ~~LOC~~ SOAJ: 600.0000 mg | Freq: Once | SUBCUTANEOUS | 0 refills | Status: AC

## 2024-05-22 NOTE — Progress Notes (Signed)
 "   Subjective   Lisa [name] is a 64 y.o. female who presents for the following: Rash. Patient is new patient  Today patient reports: Rash all over the body since October; itchy and painful. No environmental, medication, dietary changes. Patient has tried Triamcinolone  and Clobetasol  w/o much relief.   Review of Systems:    No other skin or systemic complaints except as noted in HPI or Assessment and Plan.  The following portions of the chart were reviewed this encounter and updated as appropriate: medications, allergies, medical history  Relevant Medical History:  Cirrhosis, Hypertension, type 2 diabetes, hyperlipidemia, eczema  PBC - on ursodiol    Objective  (SKPE) Well appearing patient in no apparent distress; mood and affect are within normal limits. Examination was performed of the: Focused Exam of: Upper and lower extremities, back, abdomen    Examination notable for: lichenified scaly plaques of arms Diffusely with domed hyperpigmented papules, pih  Examination limited by: Clothing and Patient deferred removal       Assessment & Plan  (SKAP)   Severe pruritus with atopic dermatitis, prurigo nodularis IGA-CPG Stage / Activity 4/4  - Patient with history of PBC, on ursodiol . Follows with GI team at [facility] (Dr. Russella). Reviewed last note from 11/2023 with labs showing AST 155, ALT 187, Tbili 1.9, bile acid 142.3. At that visit ursodiol  was adjusted, also noted consideration of newer PBC tx (elafibranor). On cholestyramine.  - I do suspect itch is cholestatic in nature leading to prurigo nodules from chronic scratching. There may be an underlying component of AD (endorses eczema as a child).   - Discussed use of topical steroids as outlined below. Discussed use of dupixent  - advised this will work for atopic/ IL4-13 mediated disease but that she should touch base with her liver team to see if any medications need to be adjusted to help with itch associated w/ PBC   -  Plan: - For mild areas: start triamcinolone  0.1% ointment twice daily - For severe areas: start clobetasol  0.5% ointment twice daily - For areas on face: start tacrolimus  0.1 ointment twice daily - Start dupilumab  - 600mg  subcutaneously on day 0, then 300mg  every other week starting day 14 - Side effects reviewed at length: injection site reactions, nasopharyngitis/conjunctivitis/URI, headache, exacerbation of atopic dermatitis, parasitic infection (be careful when traveling or camping)' - No dose adjustment needed for liver disease but she will confirm with liver team prior to starting - Ordered AST,ALT, bile acids    Was sun protection counseling provided?: No   Level of service outlined above   Patient instructions (SKPI)   Procedures, orders, diagnosis for this visit:  ATOPIC DERMATITIS, UNSPECIFIED TYPE   This Visit - ALT - AST - Bile acids, total - clobetasol  ointment (TEMOVATE ) 0.05 % - Apply 1 gram topically to affected area of skin twice daily. Stop once resolved and restart as needed for flares. Avoid use on face, armpits, groin unless otherwise indicated. - triamcinolone  ointment (KENALOG ) 0.1 % - Apply 7 grams twice daily to affected areas of skin. Stop once resolved and restart as needed for flares. Avoid use on face, armpits, groin unless otherwise indicated. - tacrolimus  (PROTOPIC ) 0.1 % ointment - Apply 2 grams twice daily to affected areas of skin - Dupilumab  (DUPIXENT ) 300 MG/2ML SOAJ - Inject 300 mg into the skin every 14 (fourteen) days. Starting at day 15 for maintenance. - Dupilumab  (DUPIXENT ) 300 MG/2ML SOAJ - Inject 600 mg into the skin once for 1 dose. On  day 1.  Atopic dermatitis, unspecified type -     Clobetasol  Propionate; Apply 1 gram topically to affected area of skin twice daily. Stop once resolved and restart as needed for flares. Avoid use on face, armpits, groin unless otherwise indicated.  Dispense: 60 g; Refill: 5 -     Triamcinolone  Acetonide;  Apply 7 grams twice daily to affected areas of skin. Stop once resolved and restart as needed for flares. Avoid use on face, armpits, groin unless otherwise indicated.  Dispense: 454 g; Refill: 2 -     Tacrolimus ; Apply 2 grams twice daily to affected areas of skin  Dispense: 100 g; Refill: 5 -     ALT -     AST -     Bile acids, total -     Dupixent ; Inject 300 mg into the skin every 14 (fourteen) days. Starting at day 15 for maintenance.  Dispense: 4 mL; Refill: 10 -     Dupixent ; Inject 600 mg into the skin once for 1 dose. On day 1.  Dispense: 4 mL; Refill: 0    Return to clinic: Return 4-6 weeks, for atopic derm.  I, Emerick Ege, CMA am acting as scribe for [name] JAYSON Kanaris, MD.   Documentation: I have reviewed the above documentation for accuracy and completeness, and I agree with the above.  [name] JAYSON Kanaris, MD  "

## 2024-05-22 NOTE — Patient Instructions (Addendum)
 - For mild areas: start triamcinolone  0.1% ointment twice daily - For severe areas: start clobetasol  0.5% ointment twice daily - For areas on face: start tacrolimus  0.1 ointment twice daily  - Start dupilumab  - 600mg  subcutaneously on day 0, then 300mg  every other week starting day 14 **AFTER discussion with liver doctor   Due to recent changes in healthcare laws, you may see results of your pathology and/or laboratory studies on MyChart before the doctors have had a chance to review them. We understand that in some cases there may be results that are confusing or concerning to you. Please understand that not all results are received at the same time and often the doctors may need to interpret multiple results in order to provide you with the best plan of care or course of treatment. Therefore, we ask that you please give us  2 business days to thoroughly review all your results before contacting the office for clarification. Should we see a critical lab result, you will be contacted sooner.   If You Need Anything After Your Visit  If you have any questions or concerns for your doctor, please call our main line at [phone] and press option 4 to reach your doctor's medical assistant. If no one answers, please leave a voicemail as directed and we will return your call as soon as possible. Messages left after 4 pm will be answered the following business day.   You may also send us  a message via MyChart. We typically respond to MyChart messages within 1-2 business days.  For prescription refills, please ask your pharmacy to contact our office. Our fax number is [phone].  If you have an urgent issue when the clinic is closed that cannot wait until the next business day, you can page your doctor at the number below.    Please note that while we do our best to be available for urgent issues outside of office hours, we are not available 24/7.   If you have an urgent issue and are unable to reach  us , you may choose to seek medical care at your doctor's office, retail clinic, urgent care center, or emergency room.  If you have a medical emergency, please immediately call 911 or go to the emergency department.  Pager Numbers  - Dr. Hester: [phone]  - Dr. Jackquline: [phone]  - Dr. Claudene: [phone]   - Dr. [name]: [phone]  In the event of inclement weather, please call our main line at [phone] for an update on the status of any delays or closures.  Dermatology Medication Tips: Please keep the boxes that topical medications come in in order to help keep track of the instructions about where and how to use these. Pharmacies typically print the medication instructions only on the boxes and not directly on the medication tubes.   If your medication is too expensive, please contact our office at [phone] option 4 or send us  a message through MyChart.   We are unable to tell what your co-pay for medications will be in advance as this is different depending on your insurance coverage. However, we may be able to find a substitute medication at lower cost or fill out paperwork to get insurance to cover a needed medication.   If a prior authorization is required to get your medication covered by your insurance company, please allow us  1-2 business days to complete this process.  Drug prices often vary depending on where the prescription is filled and some pharmacies may offer cheaper  prices.  The website www.goodrx.com contains coupons for medications through different pharmacies. The prices here do not account for what the cost may be with help from insurance (it may be cheaper with your insurance), but the website can give you the price if you did not use any insurance.  - You can print the associated coupon and take it with your prescription to the pharmacy.  - You may also stop by our office during regular business hours and pick up a GoodRx coupon card.  - If  you need your prescription sent electronically to a different pharmacy, notify our office through [facility] or by phone at [phone] option 4.     Si Usted Necesita Algo Despus de Su Visita  Tambin puede enviarnos un mensaje a travs de Clinical Cytogeneticist. Por lo general respondemos a los mensajes de MyChart en el transcurso de 1 a 2 das hbiles.  Para renovar recetas, por favor pida a su farmacia que se ponga en contacto con nuestra oficina. Randi [place] de fax es [place] [phone].  Si tiene un asunto urgente cuando la clnica est cerrada y que no puede esperar hasta el siguiente da hbil, puede llamar/localizar a su doctor(a) al nmero que aparece a continuacin.   Por favor, tenga en cuenta que aunque hacemos todo lo posible para estar disponibles para asuntos urgentes fuera del horario de [place], no estamos disponibles las 24 horas del da, los 7 [address]  [address] de la [place].   Si tiene un problema urgente y no puede comunicarse con nosotros, puede optar por buscar atencin mdica  en el consultorio de su doctor(a), en una clnica privada, en un centro de atencin urgente o en una sala de emergencias.  Si tiene engineer, drilling, por favor llame inmediatamente al 911 o vaya a la sala de emergencias.  Nmeros de bper  - Dr. Hester: [phone]  - Dra. Jackquline: [phone]  - Dr. Claudene: [phone]  - Dra. Kitts: [phone]  En caso de inclemencias del [place], por favor llame a nuestra lnea principal al [phone] para una actualizacin sobre el estado de cualquier retraso o cierre.  Consejos para la medicacin en dermatologa: Por favor, guarde las cajas en las que vienen los medicamentos de uso tpico para ayudarle a seguir las instrucciones sobre dnde y cmo usarlos. Las farmacias generalmente imprimen las instrucciones del medicamento slo en las cajas y no directamente en los tubos del [place].   Si su medicamento es muy caro, por favor, pngase en contacto  con landry rieger llamando al [phone] y presione la opcin 4 o envenos un mensaje a travs de Clinical Cytogeneticist.   No podemos decirle cul ser su copago por los medicamentos por adelantado ya que esto es diferente dependiendo de la cobertura de su seguro. Sin embargo, es posible que podamos encontrar un medicamento sustituto a audiological scientist un formulario para que el seguro cubra el medicamento que se considera necesario.   Si se requiere una autorizacin previa para que su compaa de seguros cubra su medicamento, por favor permtanos de 1 a 2 das hbiles para completar este proceso.  Los precios de los medicamentos varan con frecuencia dependiendo del environmental consultant de dnde se surte la receta y alguna farmacias pueden ofrecer precios ms baratos.  El sitio web www.goodrx.com tiene cupones para medicamentos de health and safety inspector. Los precios aqu no tienen en cuenta lo que podra costar con la ayuda del seguro (puede ser ms barato con su seguro), pero el sitio web puede darle el precio  si no utiliz kelly services.  - Puede imprimir el cupn correspondiente y llevarlo con su receta a la farmacia.  - Tambin puede pasar por nuestra oficina durante el horario de atencin regular y education officer, museum una tarjeta de cupones de GoodRx.  - Si necesita que su receta se enve electrnicamente a una farmacia diferente, informe a nuestra oficina a travs de MyChart de [place] o por telfono llamando al [phone] y presione la opcin 4.

## 2024-05-24 LAB — BILE ACIDS, TOTAL: Bile Acids Total: 170.6 umol/L — ABNORMAL HIGH (ref 0.0–10.0)

## 2024-05-24 LAB — AST: AST: 103 IU/L — ABNORMAL HIGH (ref 0–40)

## 2024-05-24 LAB — ALT: ALT: 85 IU/L — ABNORMAL HIGH (ref 0–32)

## 2024-05-26 ENCOUNTER — Ambulatory Visit: Payer: Self-pay

## 2024-05-26 NOTE — Progress Notes (Signed)
 LMTRC

## 2024-05-29 ENCOUNTER — Telehealth: Payer: Self-pay

## 2024-05-29 NOTE — Telephone Encounter (Signed)
 Discussed with patient and she verbalized understanding.

## 2024-05-29 NOTE — Telephone Encounter (Signed)
 Team - can you please let patient know that I discussed with her liver team and there are other treatment options for her PBC that could help with her itch. She should reach out to them to discuss further. Thx!    RE: Shared Patient Received: [name] Lisa [name])  [name], [name] BROCKS, MD     Hi, There are other options but she has preferred to defer adding another medication thus far in our prior conversations. Do you get the sense she has changed her mind? If she is open to it now I would be happy to add elafibranor which can help with her LFTs as well as with itching.  Best, Lisa Russella, MD, PhD GI and Hepatology [facility] Phone: [phone] Fax: [phone] Previous Messages  ----- Message ----- From: [name]) Sent: 05/26/2024  12:27 PM EST To: Lisa Veda Russella, MD [facility]) Subject: Shared Patient  Hi Dr. Russella, I am a dermatologist at [facility] and wanted to reach out regarding this shared patient. I saw her for itch/prurigo nodularis last week. I suspect itch is cholestatic in nature but might have an underlying component of eczema as well. I rechecked her labs - she does have an increase in her total bile acids from when you last saw her.  Was wondering if any of her PBC meds need to be adjusted or if there is something else she could try?  I was going to start her on dupixent  but not sure how much it will help.  Thanks, [name]

## 2024-06-02 ENCOUNTER — Ambulatory Visit: Admitting: Family Medicine

## 2024-06-05 ENCOUNTER — Ambulatory Visit: Admitting: Family Medicine

## 2024-06-05 ENCOUNTER — Encounter: Payer: Self-pay | Admitting: Family Medicine

## 2024-06-05 VITALS — BP 136/66 | HR 74 | Resp 14 | Ht 69.0 in | Wt 190.1 lb

## 2024-06-05 DIAGNOSIS — Z124 Encounter for screening for malignant neoplasm of cervix: Secondary | ICD-10-CM

## 2024-06-05 DIAGNOSIS — M545 Low back pain, unspecified: Secondary | ICD-10-CM

## 2024-06-05 DIAGNOSIS — Z789 Other specified health status: Secondary | ICD-10-CM

## 2024-06-05 DIAGNOSIS — Z1231 Encounter for screening mammogram for malignant neoplasm of breast: Secondary | ICD-10-CM

## 2024-06-05 DIAGNOSIS — E1169 Type 2 diabetes mellitus with other specified complication: Secondary | ICD-10-CM

## 2024-06-05 DIAGNOSIS — N3091 Cystitis, unspecified with hematuria: Secondary | ICD-10-CM | POA: Diagnosis not present

## 2024-06-05 DIAGNOSIS — Z1211 Encounter for screening for malignant neoplasm of colon: Secondary | ICD-10-CM

## 2024-06-05 DIAGNOSIS — Z23 Encounter for immunization: Secondary | ICD-10-CM

## 2024-06-05 LAB — POCT URINALYSIS DIPSTICK
Glucose, UA: POSITIVE — AB
Ketones, UA: NEGATIVE
Leukocytes, UA: NEGATIVE
Nitrite, UA: POSITIVE
Protein, UA: POSITIVE — AB
Spec Grav, UA: 1.015
Urobilinogen, UA: 1 U/dL
pH, UA: 6.5

## 2024-06-05 MED ORDER — SULFAMETHOXAZOLE-TRIMETHOPRIM 800-160 MG PO TABS: 1.0000 | ORAL_TABLET | Freq: Two times a day (BID) | ORAL | 0 refills | Status: AC

## 2024-06-05 MED ORDER — TERCONAZOLE 0.4 % VA CREA: 1.0000 | TOPICAL_CREAM | Freq: Every day | VAGINAL | 0 refills | Status: AC

## 2024-06-05 NOTE — Progress Notes (Signed)
 "     Acute visit   Patient: Lisa [name]   DOB: [birth year]   64 y.o. Female  MRN: [id number] PCP: Myrla Jon HERO, MD   Chief Complaint  Patient presents with   Acute Visit    UTI- pt reports burning with lower back pain ongoing for a while. Pt reports Dr is aware of going on for a while. No otc treatment.   Subjective    Discussed the use of AI scribe software for clinical note transcription with the patient, who gave verbal consent to proceed.  History of Present Illness   Lisa [name] is a 64 year old female who presents with symptoms of a urinary tract infection.  She has burning with urination and itching inside and outside the genital area. A recent urine test showed positive nitrites and possible blood. She has prior E. coli urinary infections resistant to Cipro and Levaquin and is allergic to those, Augmentin, and clarithromycin. She is unsure if she can tolerate sulfa  drugs such as Bactrim , and this is not on her allergy list.       Review of Systems  Objective    BP 136/66   Pulse 74   Resp 14   Ht 5' 9 (1.753 m)   Wt 190 lb 1.6 oz (86.2 kg)   SpO2 100%   BMI 28.07 kg/m  Physical Exam Vitals reviewed.  Constitutional:      General: She is not in acute distress.    Appearance: She is well-developed.  HENT:     Head: Normocephalic and atraumatic.  Eyes:     General: No scleral icterus.    Conjunctiva/sclera: Conjunctivae normal.  Cardiovascular:     Rate and Rhythm: Normal rate and regular rhythm.     Heart sounds: Normal heart sounds. No murmur heard. Pulmonary:     Effort: Pulmonary effort is normal. No respiratory distress.     Breath sounds: Normal breath sounds. No wheezing or rales.  Abdominal:     General: There is no distension.     Palpations: Abdomen is soft.     Tenderness: There is no abdominal tenderness. There is no right CVA tenderness, left CVA tenderness, guarding or rebound.  Skin:    General: Skin is warm and  dry.     Capillary Refill: Capillary refill takes less than 2 seconds.     Findings: No rash.  Neurological:     Mental Status: She is alert and oriented to person, place, and time.  Psychiatric:        Behavior: Behavior normal.       Results for orders placed or performed in visit on 06/05/24  POCT Urinalysis Dipstick  Result Value Ref Range   Color, UA amber    Clarity, UA clear    Glucose, UA Positive (A) Negative   Bilirubin, UA 3+    Ketones, UA negative    Spec Grav, UA 1.015 1.010 - 1.025   Blood, UA trace    pH, UA 6.5 5.0 - 8.0   Protein, UA Positive (A) Negative   Urobilinogen, UA 1.0 0.2 or 1.0 E.U./dL   Nitrite, UA positive    Leukocytes, UA Negative Negative   Appearance     Odor      Assessment & Plan     Problem List Items Addressed This Visit   None Visit Diagnoses       Cystitis with hematuria    -  Primary     Low  back pain without sciatica, unspecified back pain laterality, unspecified chronicity       Relevant Orders   POCT Urinalysis Dipstick (Completed)   Urine Culture   Urine Microscopic           Urinary tract infection Recurrent urinary tract infection with dysuria and vulvar pruritus. Urinalysis shows proteinuria, trace glucose, RBCs, and positive nitrites, indicating infection. Previous E. coli infection resistant to Cipro and Levaquin. Allergic to Cipro, Augmentin, and clarithromycin. Bactrim  (sulfa  antibiotic) is considered safe for treatment. - Prescribed Bactrim  (sulfa  antibiotic) twice daily for five days. - Sent urine culture and microscopic examination to confirm infection and presence of blood. - Prescribed terbinafine cream for vulvar itching. - Will refer to gynecology if vulvar itching persists after treatment.       Meds ordered this encounter  Medications   sulfamethoxazole -trimethoprim  (BACTRIM  DS) 800-160 MG tablet    Sig: Take 1 tablet by mouth 2 (two) times daily for 5 days.    Dispense:  10 tablet    Refill:   0   terconazole  (TERAZOL 7 ) 0.4 % vaginal cream    Sig: Place 1 applicator vaginally at bedtime.    Dispense:  45 g    Refill:  0     Return in about 3 months (around 09/03/2024) for chronic disease f/u.      Jon Eva, MD  [facility] [phone] (phone) [phone] (fax)  [facility]  "

## 2024-06-06 LAB — URINALYSIS, MICROSCOPIC ONLY
Casts: NONE SEEN /LPF
Epithelial Cells (non renal): >10 /HPF — AB (ref 0–10)

## 2024-06-06 LAB — SPECIMEN STATUS REPORT

## 2024-06-09 ENCOUNTER — Ambulatory Visit: Payer: Self-pay | Admitting: Family Medicine

## 2024-06-10 LAB — URINE CULTURE

## 2024-06-10 LAB — SPECIMEN STATUS REPORT

## 2024-06-13 NOTE — Progress Notes (Signed)
 3rd attempt. No answer when called, LVMTCB. Will mail letter.

## 2024-06-23 ENCOUNTER — Ambulatory Visit

## 2024-09-22 ENCOUNTER — Ambulatory Visit: Admitting: Family Medicine
